# Patient Record
Sex: Female | Born: 1955 | Race: White | Hispanic: No | Marital: Married | Smoking: Former smoker
Health system: Southern US, Community
[De-identification: ages and names within clinical notes are randomized; demographics above are authoritative.]

## PROBLEM LIST (undated history)

## (undated) DIAGNOSIS — B019 Varicella without complication: Secondary | ICD-10-CM

## (undated) DIAGNOSIS — E039 Hypothyroidism, unspecified: Secondary | ICD-10-CM

## (undated) DIAGNOSIS — M8580 Other specified disorders of bone density and structure, unspecified site: Secondary | ICD-10-CM

## (undated) DIAGNOSIS — Z8249 Family history of ischemic heart disease and other diseases of the circulatory system: Secondary | ICD-10-CM

## (undated) DIAGNOSIS — M797 Fibromyalgia: Secondary | ICD-10-CM

## (undated) DIAGNOSIS — E785 Hyperlipidemia, unspecified: Secondary | ICD-10-CM

## (undated) DIAGNOSIS — M810 Age-related osteoporosis without current pathological fracture: Secondary | ICD-10-CM

## (undated) DIAGNOSIS — G8929 Other chronic pain: Secondary | ICD-10-CM

## (undated) DIAGNOSIS — I7300 Raynaud's syndrome without gangrene: Secondary | ICD-10-CM

## (undated) DIAGNOSIS — M542 Cervicalgia: Secondary | ICD-10-CM

## (undated) DIAGNOSIS — K219 Gastro-esophageal reflux disease without esophagitis: Secondary | ICD-10-CM

## (undated) DIAGNOSIS — K589 Irritable bowel syndrome without diarrhea: Secondary | ICD-10-CM

## (undated) DIAGNOSIS — M549 Dorsalgia, unspecified: Secondary | ICD-10-CM

## (undated) DIAGNOSIS — R011 Cardiac murmur, unspecified: Secondary | ICD-10-CM

## (undated) HISTORY — DX: Irritable bowel syndrome, unspecified: K58.9

## (undated) HISTORY — DX: Cervicalgia: M54.2

## (undated) HISTORY — DX: Age-related osteoporosis without current pathological fracture: M81.0

## (undated) HISTORY — DX: Other chronic pain: G89.29

## (undated) HISTORY — DX: Dorsalgia, unspecified: M54.9

## (undated) HISTORY — PX: TONSILLECTOMY: SHX5217

## (undated) HISTORY — DX: Other specified disorders of bone density and structure, unspecified site: M85.80

## (undated) HISTORY — DX: Hyperlipidemia, unspecified: E78.5

## (undated) HISTORY — DX: Raynaud's syndrome without gangrene: I73.00

## (undated) HISTORY — PX: COLONOSCOPY: SHX174

## (undated) HISTORY — DX: Cardiac murmur, unspecified: R01.1

## (undated) HISTORY — DX: Gastro-esophageal reflux disease without esophagitis: K21.9

## (undated) HISTORY — DX: Fibromyalgia: M79.7

## (undated) HISTORY — DX: Hypothyroidism, unspecified: E03.9

## (undated) HISTORY — DX: Family history of ischemic heart disease and other diseases of the circulatory system: Z82.49

## (undated) HISTORY — DX: Varicella without complication: B01.9

---

## 1998-11-15 ENCOUNTER — Other Ambulatory Visit: Admission: RE | Admit: 1998-11-15 | Discharge: 1998-11-15 | Payer: Self-pay | Admitting: *Deleted

## 2000-01-09 ENCOUNTER — Other Ambulatory Visit: Admission: RE | Admit: 2000-01-09 | Discharge: 2000-01-09 | Payer: Self-pay | Admitting: Obstetrics and Gynecology

## 2002-12-25 ENCOUNTER — Encounter (INDEPENDENT_AMBULATORY_CARE_PROVIDER_SITE_OTHER): Payer: Self-pay | Admitting: *Deleted

## 2002-12-25 ENCOUNTER — Ambulatory Visit (HOSPITAL_COMMUNITY): Admission: RE | Admit: 2002-12-25 | Discharge: 2002-12-25 | Payer: Self-pay | Admitting: Internal Medicine

## 2004-08-26 ENCOUNTER — Encounter: Admission: RE | Admit: 2004-08-26 | Discharge: 2004-08-26 | Payer: Self-pay | Admitting: Family Medicine

## 2004-09-02 ENCOUNTER — Encounter (INDEPENDENT_AMBULATORY_CARE_PROVIDER_SITE_OTHER): Payer: Self-pay | Admitting: *Deleted

## 2004-09-02 ENCOUNTER — Ambulatory Visit: Payer: Self-pay | Admitting: Internal Medicine

## 2004-09-02 ENCOUNTER — Ambulatory Visit (HOSPITAL_COMMUNITY): Admission: RE | Admit: 2004-09-02 | Discharge: 2004-09-02 | Payer: Self-pay | Admitting: Internal Medicine

## 2006-04-19 ENCOUNTER — Ambulatory Visit: Payer: Self-pay | Admitting: Internal Medicine

## 2006-04-20 ENCOUNTER — Ambulatory Visit (HOSPITAL_COMMUNITY): Admission: RE | Admit: 2006-04-20 | Discharge: 2006-04-20 | Payer: Self-pay | Admitting: Internal Medicine

## 2006-05-03 ENCOUNTER — Ambulatory Visit: Payer: Self-pay | Admitting: Internal Medicine

## 2006-05-07 ENCOUNTER — Ambulatory Visit (HOSPITAL_COMMUNITY): Admission: RE | Admit: 2006-05-07 | Discharge: 2006-05-07 | Payer: Self-pay | Admitting: Internal Medicine

## 2007-09-26 ENCOUNTER — Emergency Department (HOSPITAL_COMMUNITY): Admission: EM | Admit: 2007-09-26 | Discharge: 2007-09-26 | Payer: Self-pay | Admitting: Emergency Medicine

## 2007-11-14 ENCOUNTER — Ambulatory Visit: Payer: Self-pay | Admitting: Internal Medicine

## 2007-11-21 ENCOUNTER — Ambulatory Visit (HOSPITAL_COMMUNITY): Admission: RE | Admit: 2007-11-21 | Discharge: 2007-11-21 | Payer: Self-pay | Admitting: Internal Medicine

## 2007-11-21 ENCOUNTER — Encounter: Payer: Self-pay | Admitting: Internal Medicine

## 2007-11-25 ENCOUNTER — Ambulatory Visit: Payer: Self-pay | Admitting: Internal Medicine

## 2008-09-22 ENCOUNTER — Encounter: Payer: Self-pay | Admitting: Internal Medicine

## 2011-02-24 ENCOUNTER — Encounter: Payer: Self-pay | Admitting: Family Medicine

## 2011-03-03 NOTE — Letter (Signed)
February 11, 2007    Dr. Frankey Poot  [facility]  [id number]  [place], [place] [address]   RE:  Wanda Robertson, Wanda Robertson  MRN:  [id number]  /  DOB:  1956/03/18   Dear Gala Romney:   Scherrie Bateman has an appointment to see you for evaluation of irritable  bowel syndrome .I would appreciate your assistance for this nice lady  who is 55 years old and has had problems with bloating, dyspepsia and  constipation for at least the last 10 years since I first  saw her in  75.  She has had extensive evaluations endoscopically, as well as  blood tests and has been tried on multiple medications for irritable  bowel syndrome, but remains to be quite symptomatic.  The other  significant medical problems have been fibromyalgia.  She is dependent  on pain medications for that.  She has had  adenomatous polyp of the  rectum which was initially diagnosed 2005 and subsequently ablated with  Erbe .  She will be due for repeat colon exam this year.  She also has  osteoporosis.  Abnormal HIDA scan with only 34.9% ejection fraction,  normal being more than 50.  This was done off her pain medications,  which were discontinued the day before.  She also has a history of iron-  deficiency anemia, which  responded to iron supplements.  Upper  endoscopy in 2003 with a small bowel biopsy was negative for villous  atrophy.  I have tried proton pump inhibitors which she takes on a dally  basis,as well as  antispasmodics.  Zelnorm at some point was also tried  and also pancreatic enzymes as well as probiotic's.  She tried Paxil,  Zoloft, Prozac as well as BuSpar.  She has a positive family history of  colon cancer in her father.   She is interested in being evaluated in your clinic and learning more  about IBS and its relationship to fibromyalgia.  She is quite  knowledgeable about her disease.  She has been reading online and from  articles about IBS.  I will be interested in  your opinion.  She would  like to attend the symposium for the patients that has been scheduled  for August of this year.  I am faxing some of her extensive records to  your office. Please let me know if you need specific information that  you are missing.  I appreciate your assistance with this nice lady.    Sincerely,      Hedwig Morton. Juanda Chance, MD  Electronically Signed    DMB/MedQ  DD: 02/11/2007  DT: 02/12/2007  Job #: [id number]   CC:    Ernestina Penna, M.D.

## 2011-03-03 NOTE — Op Note (Signed)
   NAME:  Wanda Robertson, Wanda Robertson [facility]                     ACCOUNT NO.:  [id number]   MEDICAL RECORD NO.:  [id number]                   PATIENT TYPE:  AMB   LOCATION:  ENDO                                 FACILITY:  [facility]   PHYSICIAN:  Lina Sar, M.D. [facility]               DATE OF BIRTH:  1956/07/12   DATE OF PROCEDURE:  DATE OF DISCHARGE:                                 OPERATIVE REPORT   NAME OF PROCEDURE:  Flexible sigmoidoscopy with Argon laser destruction of a  sigmoid polyp.   INDICATIONS:  This 55 year old white female was found to have a carpeted  polyp in the rectosigmoid on colonoscopy six months ago.  Colonoscopy was  done because of iron-deficiency anemia.  At that time polyp was removed with  the snares.  Because of the carpeted nature of it, we were having a second  look to make sure that there is no polyp left to be destroyed.  The polyp  was adenomatous.   ENDOSCOPE:  Olympus single channel video endoscope and Argon laser  coagulator.   SEDATION:  Versed 7.5 mg IV, Demerol 100 mg IV.   FINDINGS:  Olympus single channel video endoscope passed into ____________  to rectum to sigmoid colon.  The patient was monitored by pulse oximeter.  Oxygen saturations were normal.  Her prep was excellent.  She took a Fleet's  enema prior to the procedure.  Her anal canal and rectal ampullae were  normal.  At the level of 8 cm from the rectum was tiny polypoid lesion  slightly raised with a stellate scar which identified the site of the  previous polyp.  Colonoscope passed up to a level of 30 cm.  Mucosa appeared  normal.  At that point colonoscope was then retracted and biopsies were  taken from the scar as well as from the tiny polypoid lesion.  Next, the  Argon laser coagulator was used to destroy remaining tissue at the polyp  site which measured about 1 cm2.  There was no bleeding from the site.  The  patient tolerated procedure well.   IMPRESSION:  Sigmoid polyp status post  destruction with Argon laser  coagulator.   PLAN:  1. Resume previous diet.  2. Post polypectomy orders.  3. Flexible sigmoidoscopy in one year.                                               Lina Sar, M.D. [facility]    DB/MEDQ  D:  12/25/2002  T:  12/25/2002  Job:  [id number]   cc:   Ernestina Penna, M.D.  [address] [place]  [place] [address]  Fax: [phone]

## 2011-03-03 NOTE — Assessment & Plan Note (Signed)
[facility]                           GASTROENTEROLOGY OFFICE NOTE   Wanda Robertson, Wanda Robertson                          MRN:          [id number]  DATE:05/03/2006                            DOB:          10/31/1955    HISTORY OF PRESENT ILLNESS:  Wanda Robertson is a 55 year old white female who has  irritable bowel syndrome.  She saw Dr. Marina Goodell on April 19, 2006, on emergency  basis while I was out of town, for acute abdominal pain which since then has  continued, and she says it has been only about 50% improved in the last  three weeks.  There was some radiation to the back/rather diffuse abdominal  pain associated with nausea.  There was no vomiting, but there was some  heartburn despite of taking Prevacid 30 mg daily.  She has become somewhat  more constipated, using an enema.  Her liver function tests were mildly  elevated to transaminase about one and a half times normal, but her amylase  and lipase were normal.  Ultrasound of her gallbladder showed normal  gallbladder.   MEDICATIONS:  1.  Amitiza 24 mcg p.r.n.  2.  Avinza 100 mg p.o. b.i.d.  3.  Ambien.  4.  Restoril.  5.  Vivelle patch.  6.  Prometrium.  7.  Prevacid.  8.  Provigil.   PHYSICAL EXAMINATION:  VITAL SIGNS:  Blood pressure 110/60, pulse 94, weight  111 pounds.  She was in no distress.  LUNGS:  Clear to auscultation.  ABDOMEN:  Soft, but mildly distended with decreased bowel sounds and diffuse  nontender mostly in epigastrium and left upper quadrant.  Lower abdomen was  normal.  RECTAL:  Not done.   IMPRESSION:  Rather acute abdominal pain with radiation to the back and  continuous bloating and distention suggestive for pancreatitis, rule out  irritable bowel syndrome, rule out initial biliary colic with passage of  sludge in spite of her having normal ultrasound of the gallbladder.  The  patient has been on narcotics for control of fibromyalgia.  This may also be  affecting her GI  functions.   PLAN:  1.  HIDA scan per [facility].  2.  Repeat the liver function tests, amylase and lipase today.  3.  Continue on Prevacid SoluTabs 30 mg p.o. daily.  4.  Stay on clear liquids and strictly low fat diet.  5.  Continue all other medications.                                   Hedwig Morton. Juanda Chance, MD   DMB/MedQ  DD:  05/03/2006  DT:  05/03/2006  Job #:  [id number]

## 2011-05-01 ENCOUNTER — Telehealth: Payer: Self-pay | Admitting: *Deleted

## 2011-05-01 MED ORDER — OMEPRAZOLE 40 MG PO CPDR: 40.0000 mg | DELAYED_RELEASE_CAPSULE | Freq: Every day | ORAL | Status: DC

## 2011-05-01 NOTE — Telephone Encounter (Signed)
I have spoken to patient to advise her that her insurance has denied coverage on Zegerid Packets. Patient states that she has tried omeprazole and protonix in the past but that it has been so long, she is unsure how well they worked. She thinks they gave her partial relief but the Zegerid worked better for her. She has not tried any PPI within the last 130 days. Patient and I have talked and she has agreed to try omeprazole again with once daily dosing. She will call us back if it does not work well for her and at that time, we will have more leverage with insurance coverage as she will have tried their preferred PPI's within a 130 day period. Patient verbalizes understanding to call us back if the medication does not work well for her.

## 2011-07-24 LAB — COMPREHENSIVE METABOLIC PANEL
ALT: 36 — ABNORMAL HIGH
AST: 37
Albumin: 3.3 — ABNORMAL LOW
Alkaline Phosphatase: 42
BUN: 8
CO2: 25
Calcium: 8.5
Chloride: 107
Creatinine, Ser: 0.6
GFR calc Af Amer: >60
GFR calc non Af Amer: >60
Glucose, Bld: 92
Potassium: 3.5
Sodium: 138
Total Bilirubin: 0.5
Total Protein: 6

## 2011-07-24 LAB — DIFFERENTIAL
Basophils Absolute: 0
Basophils Relative: 0
Eosinophils Absolute: 0 — ABNORMAL LOW
Eosinophils Relative: 0
Lymphocytes Relative: 8 — ABNORMAL LOW
Lymphs Abs: 0.8
Monocytes Absolute: 1
Monocytes Relative: 9
Neutro Abs: 8.4 — ABNORMAL HIGH
Neutrophils Relative %: 82 — ABNORMAL HIGH

## 2011-07-24 LAB — URINALYSIS, ROUTINE W REFLEX MICROSCOPIC
Bilirubin Urine: NEGATIVE
Glucose, UA: NEGATIVE
Hgb urine dipstick: NEGATIVE
Nitrite: NEGATIVE
Protein, ur: NEGATIVE
Specific Gravity, Urine: 1.008
Urobilinogen, UA: 0.2
pH: 7.5

## 2011-07-24 LAB — CBC
HCT: 33.3 — ABNORMAL LOW
Hemoglobin: 11.4 — ABNORMAL LOW
MCHC: 34.4
MCV: 91.3
Platelets: 211
RBC: 3.65 — ABNORMAL LOW
RDW: 13
WBC: 10.2

## 2011-07-24 LAB — PREGNANCY, URINE: Preg Test, Ur: NEGATIVE

## 2011-07-24 LAB — LIPASE, BLOOD: Lipase: 26

## 2011-08-23 DIAGNOSIS — N951 Menopausal and female climacteric states: Secondary | ICD-10-CM | POA: Insufficient documentation

## 2011-08-23 DIAGNOSIS — Z79890 Hormone replacement therapy (postmenopausal): Secondary | ICD-10-CM | POA: Insufficient documentation

## 2011-11-21 ENCOUNTER — Telehealth: Payer: Self-pay | Admitting: *Deleted

## 2011-11-21 NOTE — Telephone Encounter (Signed)
We received a note from [facility] requesting prior authorization for Zegerid capsules (1 capsule twice daily). I have contacted patient's insurance company (phone [phone]) and they have denied request for zegerid approval. I have advised insurance company that patient has tried omeprazole and protonix without relief. They have asked if she has GERD evaluated by endoscopy. I have explained that to my knowledge, no recent endoscopy has been performed. They would like patient to try a "tier 2" medication, Lansoprazole before they will approve Zegerid. They will go ahead and put in authorization of Lansoprazole for now. I have left a message for patient to call back.

## 2011-11-22 MED ORDER — LANSOPRAZOLE 30 MG PO CPDR: 30.0000 mg | DELAYED_RELEASE_CAPSULE | Freq: Two times a day (BID) | ORAL | Status: DC

## 2011-11-22 NOTE — Telephone Encounter (Signed)
I have spoken to patient and have explained that insurance will cover lansoprazole but still will not cover Zegerid yet. She will take lansoprazole bid x 1 month and will let me know if it does not work so I can then let her insurance know she has tried omeprazole, pantoprazole and lansoprazole without relief. Rx for lansoprazole sent.

## 2012-05-07 ENCOUNTER — Telehealth: Payer: Self-pay | Admitting: *Deleted

## 2012-05-07 MED ORDER — NYSTATIN 100000 UNIT/GM EX OINT: TOPICAL_OINTMENT | Freq: Two times a day (BID) | CUTANEOUS | Status: AC

## 2012-05-07 MED ORDER — PAREGORIC 2 MG/5ML PO TINC: ORAL | Status: DC

## 2012-05-07 NOTE — Telephone Encounter (Signed)
Message copied by Richardson Chiquito on Tue May 07, 2012  8:31 AM ------      Message from: Hart Carwin      Created: Mon May 06, 2012  7:42 PM      Regarding: refills       Hello Wanda Robertson, Wanda Robertson asked me to refill her Paregoric ( last filled in 2009), 240cc, 1 tsp po q 8-12 hrs prn severe diarrhea. And Nystatin 100 000 u ointment, 30 gm, apply bid to affected area, 2 refills, she uses [facility] as her Pharmacy. Also, she will come in am to get Align samples, # 3 boxes, in a bag out at the front desk on the 3rd floor. Thank you very much!! DB

## 2012-05-07 NOTE — Telephone Encounter (Signed)
Align has been placed at the front desk. Paregoric and Nystatin have been sent to [facility].

## 2012-09-06 ENCOUNTER — Other Ambulatory Visit: Payer: Self-pay | Admitting: Internal Medicine

## 2012-09-06 MED ORDER — ZOSTER VACCINE LIVE 19400 UNT/0.65ML ~~LOC~~ SOLR: 0.6500 mL | Freq: Once | SUBCUTANEOUS | Status: DC

## 2012-09-06 NOTE — Progress Notes (Signed)
Per Dr Juanda Chance, please send patient shingles vaccine to [facility] pharmacy. Rx sent.

## 2012-11-18 ENCOUNTER — Telehealth: Payer: Self-pay | Admitting: Internal Medicine

## 2012-11-18 NOTE — Telephone Encounter (Signed)
Forward 4 pages from [facility] to Dr. Lina Sar for review on 11-18-12  ym

## 2013-02-03 ENCOUNTER — Other Ambulatory Visit: Payer: Self-pay | Admitting: *Deleted

## 2013-02-03 MED ORDER — ISOMETHEPTENE-APAP-DICHLORAL 65-325-100 MG PO CAPS: ORAL_CAPSULE | ORAL | Status: DC

## 2013-02-03 NOTE — Telephone Encounter (Signed)
LAST SEEN 11/14/12. LAST REFILL 11/29/12. PLEASE ROUTE TO NURSE TO CALL IN.

## 2013-02-03 NOTE — Telephone Encounter (Signed)
CALL IN TO [facility]

## 2013-02-05 NOTE — Telephone Encounter (Signed)
PLEASE CALL IN. NOT CALLED IN YET. THANKS

## 2013-02-05 NOTE — Telephone Encounter (Signed)
Called to [facility] and message left on their voicemail.

## 2013-05-04 ENCOUNTER — Other Ambulatory Visit: Payer: Self-pay | Admitting: Family Medicine

## 2013-05-08 ENCOUNTER — Telehealth: Payer: Self-pay | Admitting: Pharmacist

## 2013-05-08 NOTE — Telephone Encounter (Signed)
Discussed with patient that all pain medication should be written by same physician.  Patient to discuss with Dr Vear Clock at next visit about starting to write Rx's for her tramadol in future

## 2013-05-08 NOTE — Telephone Encounter (Signed)
Received corrrespondance from [facility] regarding medications.  Left Message for patient to call our office to discuss

## 2013-05-15 ENCOUNTER — Ambulatory Visit (INDEPENDENT_AMBULATORY_CARE_PROVIDER_SITE_OTHER): Payer: BC Managed Care – PPO | Admitting: Family Medicine

## 2013-05-15 ENCOUNTER — Encounter: Payer: Self-pay | Admitting: Family Medicine

## 2013-05-15 VITALS — BP 127/86 | HR 88 | Temp 97.4°F | Ht 64.25 in | Wt 118.2 lb

## 2013-05-15 DIAGNOSIS — E785 Hyperlipidemia, unspecified: Secondary | ICD-10-CM | POA: Insufficient documentation

## 2013-05-15 DIAGNOSIS — K219 Gastro-esophageal reflux disease without esophagitis: Secondary | ICD-10-CM

## 2013-05-15 DIAGNOSIS — IMO0001 Reserved for inherently not codable concepts without codable children: Secondary | ICD-10-CM

## 2013-05-15 DIAGNOSIS — E559 Vitamin D deficiency, unspecified: Secondary | ICD-10-CM

## 2013-05-15 DIAGNOSIS — M810 Age-related osteoporosis without current pathological fracture: Secondary | ICD-10-CM

## 2013-05-15 DIAGNOSIS — E039 Hypothyroidism, unspecified: Secondary | ICD-10-CM

## 2013-05-15 DIAGNOSIS — R5383 Other fatigue: Secondary | ICD-10-CM

## 2013-05-15 DIAGNOSIS — M797 Fibromyalgia: Secondary | ICD-10-CM | POA: Insufficient documentation

## 2013-05-15 DIAGNOSIS — G8929 Other chronic pain: Secondary | ICD-10-CM

## 2013-05-15 DIAGNOSIS — M542 Cervicalgia: Secondary | ICD-10-CM | POA: Insufficient documentation

## 2013-05-15 DIAGNOSIS — Z8249 Family history of ischemic heart disease and other diseases of the circulatory system: Secondary | ICD-10-CM

## 2013-05-15 HISTORY — DX: Age-related osteoporosis without current pathological fracture: M81.0

## 2013-05-15 HISTORY — DX: Hypothyroidism, unspecified: E03.9

## 2013-05-15 HISTORY — DX: Gastro-esophageal reflux disease without esophagitis: K21.9

## 2013-05-15 HISTORY — DX: Hyperlipidemia, unspecified: E78.5

## 2013-05-15 HISTORY — DX: Fibromyalgia: M79.7

## 2013-05-15 LAB — POCT CBC
Granulocyte percent: 68.4 %G (ref 37–80)
Hemoglobin: 13.3 g/dL (ref 12.2–16.2)
MCH, POC: 30.4 pg (ref 27–31.2)
MCV: 93.1 fL (ref 80–97)
MPV: 8 fL (ref 0–99.8)
Platelet Count, POC: 236 10*3/uL (ref 142–424)
RBC: 4.4 M/uL (ref 4.04–5.48)

## 2013-05-15 MED ORDER — ISOMETHEPTENE-APAP-DICHLORAL 65-325-100 MG PO CAPS: ORAL_CAPSULE | ORAL | Status: DC

## 2013-05-15 MED ORDER — CARISOPRODOL 350 MG PO TABS: 350.0000 mg | ORAL_TABLET | Freq: Two times a day (BID) | ORAL | Status: DC | PRN

## 2013-05-15 MED ORDER — SYNTHROID 50 MCG PO TABS: ORAL_TABLET | ORAL | Status: DC

## 2013-05-15 MED ORDER — ZOLPIDEM TARTRATE ER 12.5 MG PO TBCR: 12.5000 mg | EXTENDED_RELEASE_TABLET | Freq: Every evening | ORAL | Status: DC | PRN

## 2013-05-15 NOTE — Addendum Note (Signed)
Addended by: Tommas Olp on: 05/15/2013 01:15 PM   Modules accepted: Orders

## 2013-05-15 NOTE — Progress Notes (Signed)
Subjective:    Patient ID: Wanda Robertson, female    DOB: Apr 20, 1956, 57 y.o.   MRN: [id number]  HPI Patient returns to clinic for followup and management of chronic medical problems. She is followed by the pain clinic and Dr. Thyra Breed.   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 She appears to be up-to-date on all of her health maintenance parameters other than her upcoming flu shot and FOBT which will be given to her today. As of note there is no particular reason for not taking the flu shot other than she just prefers not to take. She has an order in for lab work to be done. Her last labs were in January of 2014. At that time all of her lab work was good including her vitamin D and her LDL C. which was 95. She also had a chest x-ray at that time and this was normal. The family history is positive for heart disease. Her last DEXA scan was January 2013   Review of Systems  Constitutional: Positive for fatigue (chronic).  HENT: Negative for ear pain, congestion, sore throat and sneezing.   Eyes: Negative for pain, discharge, redness, itching and visual disturbance.  Respiratory: Negative for cough, choking, chest tightness, shortness of breath and wheezing.   Cardiovascular: Negative.  Negative for chest pain, palpitations and leg swelling.  Gastrointestinal: Positive for constipation (intermitent).  Genitourinary: Positive for vaginal discharge (seeing GYN). Negative for dysuria and frequency.  Musculoskeletal: Positive for myalgias (all over), back pain (cervical) and arthralgias (bilateral shoulders,wrists).  Skin: Negative.  Negative for color change, pallor, rash and wound.  Allergic/Immunologic: Negative.  Negative for environmental allergies, food allergies and immunocompromised state.  Neurological: Positive for dizziness and headaches (occasional). Negative for tremors, weakness, light-headedness and numbness.  Psychiatric/Behavioral: Positive for sleep disturbance (nightly).  Negative for confusion and agitation. The patient is not nervous/anxious.        Objective:   Physical Exam BP 127/86  Pulse 88  Temp(Src) 97.4 F (36.3 C) (Oral)  Ht 5' 4.25" (1.632 m)  Wt 118 lb 3.2 oz (53.615 kg)  BMI 20.13 kg/m2  The patient appeared well nourished and normally developed, alert and oriented to time and place. Speech, behavior and judgement appear normal. Vital signs as documented.  Head exam is unremarkable. No scleral icterus or pallor noted.  Neck is without jugular venous distension, thyromegally, or carotid bruits. Carotid upstrokes are brisk bilaterally. No cervical adenopathy. Lungs are clear anteriorly and posteriorly to auscultation. Normal respiratory effort. Cardiac exam reveals regular rate and rhythm at 72 per minute. First and second heart sounds normal.  No murmurs, rubs or gallops.  Abdominal exam reveals normal bowl sounds, no masses, no organomegaly and no aortic enlargement. No inguinal adenopathy. Extremities are nonedematous and both femoral and pedal pulses are normal. Skin without pallor or jaundice.  Warm and dry, without rash. Neurologic exam reveals normal deep tendon reflexes and normal sensation.           Assessment & Plan:  1. Hyperlipemia - Hepatic function panel; Standing - NMR, lipoprofile; Standing  2. GERD (gastroesophageal reflux disease)  3. Fibromyalgia  4. Chronic neck pain with neuropathy  5. Fatigue- POCT CBC; Standing - BMP8+EGFR; Standing  6. Vitamin D deficiency - Vitamin D 25 hydroxy; Standing  7. Hypothyroidism   Patient Instructions  Don't put yourself at risk of falling, avoid precarious situations. Do not climb. Continue medications as directed Continue aggressive therapeutic lifestyle changes which include diet and exercise  Follow with pain clinic per their directions    We will arrange a visit for a stress test at some point in the future with Dr. Shirlee Latch was [facility] .  Nyra Capes MD

## 2013-05-15 NOTE — Patient Instructions (Addendum)
Don't put yourself at risk of falling, avoid precarious situations. Do not climb. Continue medications as directed Continue aggressive therapeutic lifestyle changes which include diet and exercise Follow with pain clinic per their directions

## 2013-05-15 NOTE — Addendum Note (Signed)
Addended by: Bearl Mulberry on: 05/15/2013 01:15 PM   Modules accepted: Orders, Medications

## 2013-05-17 LAB — BMP8+EGFR
BUN/Creatinine Ratio: 22 (ref 9–23)
BUN: 17 mg/dL (ref 6–24)
CO2: 27 mmol/L (ref 18–29)
Calcium: 9.6 mg/dL (ref 8.7–10.2)
Creatinine, Ser: 0.78 mg/dL (ref 0.57–1.00)
GFR calc non Af Amer: 85 mL/min/{1.73_m2} (ref >59)
Sodium: 141 mmol/L (ref 134–144)

## 2013-05-17 LAB — HEPATIC FUNCTION PANEL
AST: 18 IU/L (ref 0–40)
Albumin: 4.3 g/dL (ref 3.5–5.5)
Total Bilirubin: 0.2 mg/dL (ref 0.0–1.2)
Total Protein: 6.9 g/dL (ref 6.0–8.5)

## 2013-05-17 LAB — NMR, LIPOPROFILE
Cholesterol: 145 mg/dL (ref <200)
HDL Cholesterol by NMR: 43 mg/dL (ref >=40)
HDL Particle Number: 24.2 umol/L — ABNORMAL LOW (ref >=30.5)
LDLC SERPL CALC-MCNC: 88 mg/dL (ref <100)
Small LDL Particle Number: 524 nmol/L (ref <=527)

## 2013-07-17 ENCOUNTER — Institutional Professional Consult (permissible substitution): Payer: Self-pay | Admitting: Cardiology

## 2013-08-04 DIAGNOSIS — B351 Tinea unguium: Secondary | ICD-10-CM

## 2013-08-15 ENCOUNTER — Ambulatory Visit: Payer: BC Managed Care – PPO

## 2013-08-15 ENCOUNTER — Ambulatory Visit (INDEPENDENT_AMBULATORY_CARE_PROVIDER_SITE_OTHER): Payer: BC Managed Care – PPO | Admitting: Emergency Medicine

## 2013-08-15 VITALS — BP 144/90 | HR 92 | Temp 97.9°F | Resp 16 | Ht 65.0 in | Wt 118.0 lb

## 2013-08-15 DIAGNOSIS — R079 Chest pain, unspecified: Secondary | ICD-10-CM

## 2013-08-15 DIAGNOSIS — S20219A Contusion of unspecified front wall of thorax, initial encounter: Secondary | ICD-10-CM

## 2013-08-15 DIAGNOSIS — S20212A Contusion of left front wall of thorax, initial encounter: Secondary | ICD-10-CM

## 2013-08-15 NOTE — Patient Instructions (Signed)
Rib Fracture  Your caregiver has diagnosed you as having a rib fracture (a break). This can occur by a blow to the chest, by a fall against a hard object, or by violent coughing or sneezing. There may be one or many breaks. Rib fractures may heal on their own within 3 to 8 weeks. The longer healing period is usually associated with a continued cough or other aggravating activities.  HOME CARE INSTRUCTIONS    Avoid strenuous activity. Be careful during activities and avoid bumping the injured rib. Activities that cause pain pull on the fracture site(s) and are best avoided if possible.   Eat a normal, well-balanced diet. Drink plenty of fluids to avoid constipation.   Take deep breaths several times a day to keep lungs free of infection. Try to cough several times a day, splinting the injured area with a pillow. This will help prevent pneumonia.   Do not wear a rib belt or binder. These restrict breathing which can lead to pneumonia.   Only take over-the-counter or prescription medicines for pain, discomfort, or fever as directed by your caregiver.  SEEK MEDICAL CARE IF:   You develop a continual cough, associated with thick or bloody sputum.  SEEK IMMEDIATE MEDICAL CARE IF:    You have a fever.   You have difficulty breathing.   You have nausea (feeling sick to your stomach), vomiting, or abdominal (belly) pain.   You have worsening pain, not controlled with medications.  Document Released: 10/02/2005 Document Revised: 12/25/2011 Document Reviewed: 03/06/2007  Exit[employer] Patient Information 2014 [employer].

## 2013-08-15 NOTE — Progress Notes (Signed)
[facility] [address], [place] [address] [phone]  Date:  08/15/2013   Name:  Wanda Robertson   DOB:  July 17, 1956   MRN:  [id number]  PCP:  Rudi Heap, MD    Chief Complaint: Fall   History of Present Illness:  Wanda Robertson is a 57 y.o. very pleasant female patient who presents with the following:  Tripped over a ball last night and fell against a granite top corner.  Injured her left back.  No shortness of breath, nausea or vomiting, hemoptysis, abdominal pain or hematuria.  No improvement with over the counter medications or other home remedies. Denies other complaint or health concern today.   Patient Active Problem List   Diagnosis Date Noted  . Hyperlipemia 05/15/2013  . GERD (gastroesophageal reflux disease) 05/15/2013  . Fibromyalgia 05/15/2013  . Chronic neck pain, with peripheral neuropathy 05/15/2013  . Osteoporosis 05/15/2013  . Hypothyroidism 05/15/2013    Past Medical History  Diagnosis Date  . Hyperlipidemia   . Chronic back pain   . Chronic neck pain   . Osteopenia   . IBS (irritable bowel syndrome)     No past surgical history on file.  History  Substance Use Topics  . Smoking status: Never Smoker   . Smokeless tobacco: Not on file  . Alcohol Use: No    Family History  Problem Relation Age of Onset  . Heart disease      family history   . Colon cancer      family history     Allergies  Allergen Reactions  . Compazine [Prochlorperazine Edisylate] Other (See Comments)    Severe muscle requiring ER visit  . Penicillins Hives  . Sulfa Antibiotics Rash    Medication list has been reviewed and updated.  Current Outpatient Prescriptions on File Prior to Visit  Medication Sig Dispense Refill  . ALPRAZolam (XANAX) 0.5 MG tablet Take 0.5 mg by mouth at bedtime as needed.        . Calcium Carbonate-Vitamin D (CALCIUM PLUS VITAMIN D) 300-100 MG-UNIT CAPS Take 4 tablets by mouth daily.      . carisoprodol  (SOMA) 350 MG tablet Take 1 tablet (350 mg total) by mouth 2 (two) times daily as needed.  180 tablet  1  . Clidinium-Chlordiazepoxide (LIBRAX PO) Take by mouth as needed.        . fish oil-omega-3 fatty acids 1000 MG capsule Take 2 g by mouth daily.      Marland Kitchen isometheptene-acetaminophen-dichloralphenazone (MIDRIN) 65-325-100 MG capsule Take 1 to 2 capsules once a day as needed for tension headaches  90 capsule  1  . morphine (KADIAN) 100 MG 24 hr capsule Take 100 mg by mouth 2 (two) times daily.       . Multiple Vitamins-Minerals (CENTRUM SILVER PO) Take 1 tablet by mouth daily.      . paregoric 2 MG/5ML solution Take 1 teaspoon by mouth every 8-12 hours as needed for severe diarrhea  240 mL  1  . SYNTHROID 50 MCG tablet TAKE 1 TABLET EVERY DAY  90 tablet  1  . zolpidem (AMBIEN CR) 12.5 MG CR tablet Take 1 tablet (12.5 mg total) by mouth at bedtime as needed.  90 tablet  1  . lansoprazole (PREVACID) 30 MG capsule Take 30 mg by mouth 2 (two) times daily. As needed       No current facility-administered medications on file prior to visit.    Review of Systems:  As per HPI, otherwise negative.    Physical Examination: Filed Vitals:   08/15/13 1713  BP: 144/90  Pulse: 92  Temp: 97.9 F (36.6 C)  Resp: 16   Filed Vitals:   08/15/13 1713  Height: 5\' 5"  (1.651 m)  Weight: 118 lb (53.524 kg)   Body mass index is 19.64 kg/(m^2). Ideal Body Weight: Weight in (lb) to have BMI = 25: 149.9  GEN: WDWN, NAD, Non-toxic, A & O x 3 HEENT: Atraumatic, Normocephalic. Neck supple. No masses, No LAD. Ears and Nose: No external deformity. CV: RRR, No M/G/R. No JVD. No thrill. No extra heart sounds. PULM: CTA B, no wheezes, crackles, rhonchi. No retractions. No resp. distress. No accessory muscle use. Chest:  No ecchymosis, crepitus, or flail. Has pain in posterolateral chest wall inferiorly ABD: S, NT, ND, +BS. No rebound. No HSM. EXTR: No c/c/e NEURO Normal gait.  PSYCH: Normally interactive.  Conversant. Not depressed or anxious appearing.  Calm demeanor.    Assessment and Plan: Chest wall contusion vicodin   Signed,  Phillips Odor, MD   [facility] reading (PRIMARY) by  Dr. Dareen Piano.  Negative chest.

## 2013-08-21 ENCOUNTER — Other Ambulatory Visit: Payer: Self-pay

## 2013-10-28 ENCOUNTER — Other Ambulatory Visit: Payer: Self-pay | Admitting: Family Medicine

## 2013-10-30 NOTE — Telephone Encounter (Signed)
This is okay to refill but the patient does need to get thyroid tests done in the office at her convenience.

## 2013-10-30 NOTE — Telephone Encounter (Signed)
No thyroid labs since 09/13

## 2014-02-04 ENCOUNTER — Ambulatory Visit: Payer: BC Managed Care – PPO | Admitting: Family Medicine

## 2014-02-25 ENCOUNTER — Encounter: Payer: Self-pay | Admitting: Family Medicine

## 2014-02-25 ENCOUNTER — Ambulatory Visit (INDEPENDENT_AMBULATORY_CARE_PROVIDER_SITE_OTHER): Payer: BC Managed Care – PPO | Admitting: Family Medicine

## 2014-02-25 VITALS — BP 115/77 | HR 89 | Temp 97.0°F | Ht 65.0 in | Wt 119.0 lb

## 2014-02-25 DIAGNOSIS — E559 Vitamin D deficiency, unspecified: Secondary | ICD-10-CM

## 2014-02-25 DIAGNOSIS — M797 Fibromyalgia: Secondary | ICD-10-CM

## 2014-02-25 DIAGNOSIS — Z8249 Family history of ischemic heart disease and other diseases of the circulatory system: Secondary | ICD-10-CM

## 2014-02-25 DIAGNOSIS — I7300 Raynaud's syndrome without gangrene: Secondary | ICD-10-CM

## 2014-02-25 DIAGNOSIS — K219 Gastro-esophageal reflux disease without esophagitis: Secondary | ICD-10-CM

## 2014-02-25 DIAGNOSIS — E785 Hyperlipidemia, unspecified: Secondary | ICD-10-CM

## 2014-02-25 DIAGNOSIS — E039 Hypothyroidism, unspecified: Secondary | ICD-10-CM

## 2014-02-25 DIAGNOSIS — IMO0001 Reserved for inherently not codable concepts without codable children: Secondary | ICD-10-CM

## 2014-02-25 DIAGNOSIS — M810 Age-related osteoporosis without current pathological fracture: Secondary | ICD-10-CM

## 2014-02-25 HISTORY — DX: Family history of ischemic heart disease and other diseases of the circulatory system: Z82.49

## 2014-02-25 HISTORY — DX: Raynaud's syndrome without gangrene: I73.00

## 2014-02-25 MED ORDER — CARISOPRODOL 350 MG PO TABS: 350.0000 mg | ORAL_TABLET | Freq: Two times a day (BID) | ORAL | Status: DC | PRN

## 2014-02-25 MED ORDER — ISOMETHEPTENE-APAP-DICHLORAL 65-325-100 MG PO CAPS: ORAL_CAPSULE | ORAL | Status: DC

## 2014-02-25 MED ORDER — SUVOREXANT 10 MG PO TABS: 1.0000 | ORAL_TABLET | Freq: Every evening | ORAL | Status: DC | PRN

## 2014-02-25 NOTE — Progress Notes (Signed)
Subjective:    Patient ID: Wanda Robertson, female    DOB: 1956/05/05, 58 y.o.   MRN: [id number]  HPI Pt here for follow up and management of chronic medical problems. The patient is followed by Dr. Nicholaus Bloom for pain management. She has a strong family history of heart disease and she herself has hyperlipidemia. Her parents have both passed away. Her brother has heart disease and is still living. She will return to the office for lab work. She has not had a stress test in 5 years and we will schedule another stress test for her in the near future. 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 It even bothers her during the warmer months when she goes into buildings that are very cool and air-conditioned. She denies chest pain or shortness of breath. She still has her neck pain which radiates to both arms on occasion.          Patient Active Problem List   Diagnosis Date Noted  . Hyperlipemia 05/15/2013  . GERD (gastroesophageal reflux disease) 05/15/2013  . Fibromyalgia 05/15/2013  . Chronic neck pain, with peripheral neuropathy 05/15/2013  . Osteoporosis 05/15/2013  . Hypothyroidism 05/15/2013   Outpatient Encounter Prescriptions as of 02/25/2014  Medication Sig  . ALPRAZolam (XANAX) 0.5 MG tablet Take 0.5 mg by mouth at bedtime as needed.    . Calcium Carbonate-Vitamin D (CALCIUM PLUS VITAMIN D) 300-100 MG-UNIT CAPS Take 4 tablets by mouth daily.  . carisoprodol (SOMA) 350 MG tablet Take 1 tablet (350 mg total) by mouth 2 (two) times daily as needed.  . Clidinium-Chlordiazepoxide (LIBRAX PO) Take by mouth as needed.    . fish oil-omega-3 fatty acids 1000 MG capsule Take 2 g by mouth daily.  Marland Kitchen isometheptene-acetaminophen-dichloralphenazone (MIDRIN) 65-325-100 MG capsule Take 1 to 2 capsules once a day as needed for tension headaches  . levothyroxine (SYNTHROID) 50 MCG tablet TAKE 1 TABLET DAILY  . morphine  (KADIAN) 100 MG 24 hr capsule Take 100 mg by mouth 2 (two) times daily.   . Multiple Vitamins-Minerals (CENTRUM SILVER PO) Take 1 tablet by mouth daily.  Marland Kitchen zolpidem (AMBIEN CR) 12.5 MG CR tablet Take 1 tablet (12.5 mg total) by mouth at bedtime as needed.  . [DISCONTINUED] SYNTHROID 50 MCG tablet TAKE 1 TABLET DAILY (PATIENT NEEDS TO BE SEEN BEFORE NEXT REFILL)  . lansoprazole (PREVACID) 30 MG capsule Take 30 mg by mouth 2 (two) times daily. As needed  . paregoric 2 MG/5ML solution Take 1 teaspoon by mouth every 8-12 hours as needed for severe diarrhea    Review of Systems  Constitutional: Negative.   HENT: Negative.   Eyes: Negative.   Respiratory: Negative.   Cardiovascular: Negative.   Gastrointestinal: Negative.   Endocrine: Negative.   Genitourinary: Negative.   Musculoskeletal: Negative.   Skin: Negative.   Allergic/Immunologic: Negative.   Neurological: Negative.   Hematological: Negative.   Psychiatric/Behavioral: Negative.        Objective:   Physical Exam  Nursing note and vitals reviewed. Constitutional: She is oriented to person, place, and time. No distress.  Present cooperative small framed patient  HENT:  Head: Normocephalic.  Right Ear: External ear normal.  Left Ear: External ear normal.  Nose: Nose normal.  Mouth/Throat: Oropharynx is clear and moist.  Eyes: Conjunctivae and EOM are normal. Pupils are equal, round, and reactive to light. Right eye exhibits no discharge. Left eye exhibits  no discharge. No scleral icterus.  Neck: Normal range of motion. Neck supple. No thyromegaly present.  Cardiovascular: Normal rate, regular rhythm, normal heart sounds and intact distal pulses.  Exam reveals no gallop and no friction rub.   No murmur heard. At 72 per minute  Pulmonary/Chest: Effort normal and breath sounds normal. No respiratory distress. She has no wheezes. She has no rales. She exhibits no tenderness.  Abdominal: Soft. Bowel sounds are normal. She  exhibits no mass. There is no tenderness. There is no rebound and no guarding.  Musculoskeletal: Normal range of motion. She exhibits no edema and no tenderness.  Lymphadenopathy:    She has no cervical adenopathy.  Neurological: She is alert and oriented to person, place, and time. She has normal reflexes. No cranial nerve deficit.  Skin: Skin is warm and dry.  No pallor and no sign of raynauds on examination of fingers  Psychiatric: She has a normal mood and affect. Her behavior is normal. Judgment and thought content normal.   BP 115/77  Pulse 89  Temp(Src) 97 F (36.1 C) (Oral)  Ht _0  (1.651 m)  Wt 119 lb (53.978 kg)  BMI 19.80 kg/m2        Assessment & Plan:  1. GERD (gastroesophageal reflux disease) - POCT CBC; Future  2. Hyperlipemia - POCT CBC; Future - BMP8+EGFR; Future - Hepatic function panel; Future - NMR, lipoprofile; Future  3. Hypothyroidism - POCT CBC; Future - Thyroid Panel With TSH; Future  4. Osteoporosis - POCT CBC; Future - Vit D  25 hydroxy (rtn osteoporosis monitoring); Future -Patient gets her DEXA scans at an outside facility  5. Vitamin D deficiency - POCT CBC; Future - Vit D  25 hydroxy (rtn osteoporosis monitoring); Future  6. Family history of heart disease -We will plan for a stress test for the patient - Ambulatory referral to Cardiology  7. Raynaud's disease - Ambulatory referral to Rheumatology  8. Fibromyalgia - Ambulatory referral to Rheumatology  Patient Instructions  Continue current medications. Continue good therapeutic lifestyle changes which include good diet and exercise. Fall precautions discussed with patient. If an FOBT was given today- please return it to our front desk. If you are over 41 years old - you may need Prevnar 33 or the adult Pneumonia vaccine.  Consider having a carpenter build a platform to stand on at work. We will arrange for you to have an appointment with the cardiologist for an ETT, Dr.  Loralie Champagne We will also schedule a visit with the rheumatologist Dr. Yancey Flemings for an evaluation for Raynauds disease Please continue to follow your diet closely Return to clinic for lab work Return the FOBT   Arrie Senate MD

## 2014-02-25 NOTE — Patient Instructions (Addendum)
Continue current medications. Continue good therapeutic lifestyle changes which include good diet and exercise. Fall precautions discussed with patient. If an FOBT was given today- please return it to our front desk. If you are over 58 years old - you may need Prevnar 6 or the adult Pneumonia vaccine.  Consider having a carpenter build a platform to stand on at work. We will arrange for you to have an appointment with the cardiologist for an ETT, Dr. Loralie Champagne We will also schedule a visit with the rheumatologist Dr. Yancey Flemings for an evaluation for Raynauds disease Please continue to follow your diet closely Return to clinic for lab work Return the FOBT

## 2014-04-15 LAB — HM MAMMOGRAPHY

## 2014-05-22 ENCOUNTER — Encounter: Payer: Self-pay | Admitting: *Deleted

## 2014-06-05 ENCOUNTER — Ambulatory Visit: Payer: Self-pay | Admitting: Cardiology

## 2014-06-18 ENCOUNTER — Other Ambulatory Visit: Payer: BC Managed Care – PPO

## 2014-06-18 DIAGNOSIS — Z1212 Encounter for screening for malignant neoplasm of rectum: Secondary | ICD-10-CM

## 2014-06-20 LAB — FECAL OCCULT BLOOD, IMMUNOCHEMICAL: Fecal Occult Bld: NEGATIVE

## 2014-07-02 ENCOUNTER — Encounter: Payer: Self-pay | Admitting: *Deleted

## 2014-08-21 ENCOUNTER — Ambulatory Visit (INDEPENDENT_AMBULATORY_CARE_PROVIDER_SITE_OTHER): Payer: BC Managed Care – PPO | Admitting: Cardiology

## 2014-08-21 ENCOUNTER — Encounter: Payer: Self-pay | Admitting: Cardiology

## 2014-08-21 ENCOUNTER — Ambulatory Visit (INDEPENDENT_AMBULATORY_CARE_PROVIDER_SITE_OTHER)
Admission: RE | Admit: 2014-08-21 | Discharge: 2014-08-21 | Disposition: A | Discharge status: Home / Self Care | Payer: Self-pay | Source: Ambulatory Visit | Attending: Cardiology | Admitting: Cardiology

## 2014-08-21 VITALS — BP 136/92 | HR 96 | Ht 65.0 in | Wt 118.0 lb

## 2014-08-21 DIAGNOSIS — E785 Hyperlipidemia, unspecified: Secondary | ICD-10-CM

## 2014-08-21 DIAGNOSIS — Z9189 Other specified personal risk factors, not elsewhere classified: Secondary | ICD-10-CM

## 2014-08-21 DIAGNOSIS — Z8249 Family history of ischemic heart disease and other diseases of the circulatory system: Secondary | ICD-10-CM

## 2014-08-21 DIAGNOSIS — I491 Atrial premature depolarization: Secondary | ICD-10-CM

## 2014-08-21 DIAGNOSIS — I498 Other specified cardiac arrhythmias: Secondary | ICD-10-CM

## 2014-08-21 MED ORDER — ASPIRIN EC 81 MG PO TBEC: 81.0000 mg | DELAYED_RELEASE_TABLET | Freq: Every day | ORAL | Status: DC

## 2014-08-21 NOTE — Patient Instructions (Addendum)
Take aspirin 81mg  daily.  You will have a cardiac CT for calcium scoring today.   Your physician wants you to follow-up in: 1 year with Dr Aundra Dubin. (November 2016).  You will receive a reminder letter in the mail two months in advance. If you don't receive a letter, please call our office to schedule the follow-up appointment.

## 2014-08-23 DIAGNOSIS — I491 Atrial premature depolarization: Secondary | ICD-10-CM | POA: Insufficient documentation

## 2014-08-23 NOTE — Progress Notes (Signed)
Patient ID: Wanda Robertson, female   DOB: 25-Feb-1956, 58 y.o.   MRN: [id number] PCP: Dr Laurance Flatten  58 yo with strong family history of premature coronary disease present for cardiology evaluation.  She has not had cardiac problems in the past.  She generally does well.  No exertional dyspnea or chest pain.  She has been a vegetarian for 25 years.  She has never smoked.  She is quite active, walks her dog briskly daily and does yoga.  No lightheadedness, no palpitations.  BP is mildly elevated today but she says that she is nervous and BP is usually on the lower side.  She is supposed to be getting NMR lipid profile soon at Dr [facility] office.   ECG: Unusual P wave axis, possible ectopic atrial rhythm, prolonged QTc.   Labs (7/14): LDL 88, HDL 43, LDL particle number 1357, creatinine 0.78  PMH: 1. Hyperlipidemia 2. Raynauds syndrome 3. GERD 4. Fibromyalgia 5. Chronic neck pain with neuropathy 6. Hypothyroidism  FH: Mother with MI at 77, brother with PCI at 41 and later had CABG, father with CHF.  SH: Nonsmoker, lives in [place], works as Engineering geologist.   ROS: All systems reviewed and negative except as per HPI.   Current Outpatient Prescriptions  Medication Sig Dispense Refill  . ALPRAZolam (XANAX) 0.5 MG tablet Take 0.5 mg by mouth at bedtime as needed.      . Calcium Carbonate-Vitamin D (CALCIUM PLUS VITAMIN D) 300-100 MG-UNIT CAPS Take 4 tablets by mouth daily.    . carisoprodol (SOMA) 350 MG tablet Take 1 tablet (350 mg total) by mouth 2 (two) times daily as needed. (Patient taking differently: Take 350 mg by mouth as directed. ) 180 tablet 1  . Clidinium-Chlordiazepoxide (LIBRAX PO) Take by mouth as needed.      . fish oil-omega-3 fatty acids 1000 MG capsule Take 2 g by mouth daily.    Marland Kitchen isometheptene-acetaminophen-dichloralphenazone (MIDRIN) 65-325-100 MG capsule Take 1 to 2 capsules once a day as needed for tension headaches 90 capsule 1  . lansoprazole (PREVACID) 30 MG capsule Take  30 mg by mouth 2 (two) times daily. As needed    . levothyroxine (SYNTHROID) 50 MCG tablet TAKE 1 TABLET DAILY    . morphine (KADIAN) 100 MG 24 hr capsule Take 100 mg by mouth 2 (two) times daily.     . Multiple Vitamins-Minerals (CENTRUM SILVER PO) Take 1 tablet by mouth daily.    . paregoric 2 MG/5ML solution Take 1 teaspoon by mouth every 8-12 hours as needed for severe diarrhea 240 mL 1  . Suvorexant (BELSOMRA) 10 MG TABS Take 1 tablet by mouth at bedtime as needed. 30 tablet 1  . zolpidem (AMBIEN CR) 12.5 MG CR tablet Take 1 tablet (12.5 mg total) by mouth at bedtime as needed. 90 tablet 1  . aspirin EC 81 MG tablet Take 1 tablet (81 mg total) by mouth daily.     No current facility-administered medications for this visit.    BP 136/92 mmHg  Pulse 96  Ht 5\' 5"  (1.651 m)  Wt 118 lb (53.524 kg)  BMI 19.64 kg/m2 General: NAD Neck: No JVD, no thyromegaly or thyroid nodule.  Lungs: Clear to auscultation bilaterally with normal respiratory effort. CV: Nondisplaced PMI.  Heart regular S1/S2, no S3/S4, no murmur.  No peripheral edema.  No carotid bruit.  Normal pedal pulses.  Abdomen: Soft, nontender, no hepatosplenomegaly, no distention.  Skin: Intact without lesions or rashes.  Neurologic: Alert and oriented  x 3.  Psych: Normal affect. Extremities: No clubbing or cyanosis.  HEENT: Normal.  Assessment/Plan: 1. Coronary disease risk: Patient has no ischemic symptoms.  She is active.  She follows an excellent diet.  She does not smoke. Her main risk factor is a family history of premature CAD (mother and brother).  LDL particle number was elevated in 7/14.  - Await repeat NMR lipid profile (to be done at Dr [facility] office soon).  - I will arrange for a coronary calcium score.  If this is elevated or if lipid profile is unfavorable, would recommend statin iniation.   - It would be reasonable for her to start ASA 81 mg daily given her family history.  2. Hyperlipidemia: See discussion  above.  3. Abnormal ECG: She does appear to have an ectopic atrial rhythm.  HR is not elevated.  No intervention needed at this time.   Loralie Champagne 08/23/2014

## 2014-08-25 ENCOUNTER — Ambulatory Visit: Payer: BC Managed Care – PPO

## 2014-08-27 ENCOUNTER — Telehealth: Payer: Self-pay | Admitting: Cardiology

## 2014-08-27 NOTE — Telephone Encounter (Signed)
New message ° ° ° ° ° °Returning Anne's call to get test results °

## 2014-08-27 NOTE — Telephone Encounter (Signed)
Spoke with patient about her coronary calcium score, and Dr. Claris Gladden recommendations for lipitor. She will see her PCP in a few weeks and have a lipid profile. Told her i would forward this to her Pcp, Dr Morrie Sheldon. Said she would wait till after labs to start new medication.

## 2014-08-27 NOTE — Telephone Encounter (Signed)
Left pt a message to call back. 

## 2014-08-27 NOTE — Telephone Encounter (Signed)
Left message for patient to call back  

## 2014-09-17 ENCOUNTER — Ambulatory Visit: Payer: BC Managed Care – PPO | Admitting: Physical Therapy

## 2014-10-19 ENCOUNTER — Telehealth: Payer: Self-pay | Admitting: Family Medicine

## 2014-10-20 NOTE — Telephone Encounter (Signed)
Labs ordered on script - she will have them done at [facility] in [place], [place]

## 2014-10-26 ENCOUNTER — Encounter: Payer: Self-pay | Admitting: Cardiology

## 2014-10-26 ENCOUNTER — Ambulatory Visit: Payer: BC Managed Care – PPO | Admitting: Family Medicine

## 2014-11-02 ENCOUNTER — Ambulatory Visit (INDEPENDENT_AMBULATORY_CARE_PROVIDER_SITE_OTHER): Payer: BLUE CROSS/BLUE SHIELD | Admitting: Family Medicine

## 2014-11-02 ENCOUNTER — Encounter: Payer: Self-pay | Admitting: Family Medicine

## 2014-11-02 VITALS — BP 123/82 | HR 91 | Temp 97.9°F | Ht 65.0 in | Wt 117.0 lb

## 2014-11-02 DIAGNOSIS — Z8249 Family history of ischemic heart disease and other diseases of the circulatory system: Secondary | ICD-10-CM

## 2014-11-02 DIAGNOSIS — E785 Hyperlipidemia, unspecified: Secondary | ICD-10-CM

## 2014-11-02 DIAGNOSIS — E559 Vitamin D deficiency, unspecified: Secondary | ICD-10-CM

## 2014-11-02 DIAGNOSIS — G4700 Insomnia, unspecified: Secondary | ICD-10-CM

## 2014-11-02 DIAGNOSIS — E039 Hypothyroidism, unspecified: Secondary | ICD-10-CM

## 2014-11-02 DIAGNOSIS — K219 Gastro-esophageal reflux disease without esophagitis: Secondary | ICD-10-CM

## 2014-11-02 MED ORDER — CARISOPRODOL 350 MG PO TABS: 350.0000 mg | ORAL_TABLET | Freq: Two times a day (BID) | ORAL | Status: DC | PRN

## 2014-11-02 MED ORDER — ALPRAZOLAM 0.5 MG PO TABS: 0.5000 mg | ORAL_TABLET | Freq: Every evening | ORAL | Status: DC | PRN

## 2014-11-02 MED ORDER — LEVOTHYROXINE SODIUM 50 MCG PO TABS
50.0000 ug | ORAL_TABLET | Freq: Every day | ORAL | Status: DC
Start: 2014-11-02 — End: 2015-05-24

## 2014-11-02 NOTE — Progress Notes (Signed)
Subjective:    Patient ID: Wanda Robertson, female    DOB: [birth year], 59 y.o.   MRN: [id number]  HPI Pt here for follow up and management of chronic medical problems which include hypothyroid and  Hyperlipidemia. She is taking medications regularly. The patient continues to have chronic neck pain and neuropathy. She is a Theme park manager. She has trouble with sleeping at nighttime. She is seeing pain management. She recently saw the cardiologist because of her strong family history of heart disease and he felt like the biggest thing to do dialysis to start a baby aspirin. He indicated that he would like her to be on a statin drug if her cholesterol numbers were elevated. We received an advanced lipid panel in the mail and we will review this with her today and make sure the cardiologist gets a copy of this. She was supposed to get a coronary calcium score and I'm not sure she has had this done yet or not. The patient continues to see Dr. Nicholaus Bloom for pain management and has an appointment coming up with him soon. She is also planning to see her gynecologist soon and she will get a DEXA scan there as this is past due to. We will discuss with the clinical pharmacists about the options for insomnia with this patient. We also need to check with her gastroenterologist regarding the do date for her next colonoscopy.        Patient Active Problem List   Diagnosis Date Noted  . 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 Family history of heart disease 02/25/2014  . Raynaud's disease 02/25/2014  . Hyperlipemia 05/15/2013  . GERD (gastroesophageal reflux disease) 05/15/2013  . Fibromyalgia 05/15/2013  . Chronic neck pain, with peripheral neuropathy 05/15/2013  . Osteoporosis 05/15/2013  . Hypothyroidism 05/15/2013  . Post menopausal syndrome 08/23/2011   Outpatient Encounter Prescriptions as of 11/02/2014  Medication Sig  . ALPRAZolam (XANAX) 0.5 MG tablet Take 0.5 mg by mouth at bedtime as needed.      Marland Kitchen aspirin EC 81 MG tablet Take 1 tablet (81 mg total) by mouth daily.  . Calcium Carbonate-Vitamin D (CALCIUM PLUS VITAMIN D) 300-100 MG-UNIT CAPS Take 4 tablets by mouth daily.  . carisoprodol (SOMA) 350 MG tablet Take 1 tablet (350 mg total) by mouth 2 (two) times daily as needed. (Patient taking differently: Take 350 mg by mouth as directed. )  . Clidinium-Chlordiazepoxide (LIBRAX PO) Take by mouth as needed.    . clobetasol (TEMOVATE) 0.05 % GEL Apply topically 2 (two) times daily.  . fish oil-omega-3 fatty acids 1000 MG capsule Take 2 g by mouth daily.  Marland Kitchen isometheptene-acetaminophen-dichloralphenazone (MIDRIN) 65-325-100 MG capsule Take 1 to 2 capsules once a day as needed for tension headaches  . lansoprazole (PREVACID) 30 MG capsule Take 30 mg by mouth 2 (two) times daily. As needed  . levothyroxine (SYNTHROID) 50 MCG tablet TAKE 1 TABLET DAILY  . morphine (KADIAN) 100 MG 24 hr capsule Take 100 mg by mouth 2 (two) times daily.   . Multiple Vitamins-Minerals (CENTRUM SILVER PO) Take 1 tablet by mouth daily.  . paregoric 2 MG/5ML solution Take 1 teaspoon by mouth every 8-12 hours as needed for severe diarrhea  . traMADol (ULTRAM) 50 MG tablet Take 50 mg by mouth 2 (two) times daily.  Marland Kitchen UNABLE TO FIND Med Name: NirtoBID cream - from rheumotology  . zolpidem (AMBIEN CR) 12.5 MG CR tablet Take 1 tablet (12.5 mg total) by mouth at bedtime as  needed.  . Suvorexant (BELSOMRA) 10 MG TABS Take 1 tablet by mouth at bedtime as needed. (Patient not taking: Reported on 11/02/2014)    Review of Systems  Constitutional: Negative.        Not sleeping well  HENT: Negative.   Eyes: Negative.   Respiratory: Negative.   Cardiovascular: Negative.   Gastrointestinal: Negative.   Endocrine: Negative.   Genitourinary: Negative.   Musculoskeletal: Negative.   Skin: Negative.   Allergic/Immunologic: Negative.   Neurological: Negative.   Hematological: Negative.   Psychiatric/Behavioral: Negative.         Objective:   Physical Exam  Constitutional: She is oriented to person, place, and time. She appears well-developed and well-nourished. No distress.  HENT:  Head: Normocephalic and atraumatic.  Right Ear: External ear normal.  Left Ear: External ear normal.  Nose: Nose normal.  Mouth/Throat: Oropharynx is clear and moist.  Eyes: Conjunctivae and EOM are normal. Pupils are equal, round, and reactive to light. Right eye exhibits no discharge. Left eye exhibits no discharge. No scleral icterus.  Neck: Normal range of motion. Neck supple. No thyromegaly present.  Neck without bruits or thyromegaly  Cardiovascular: Normal rate, regular rhythm, normal heart sounds and intact distal pulses.  Exam reveals no gallop and no friction rub.   No murmur heard. At 72/m  Pulmonary/Chest: Effort normal and breath sounds normal. No respiratory distress. She has no wheezes. She has no rales. She exhibits no tenderness.  Lungs are clear anteriorly and posteriorly  Abdominal: Soft. Bowel sounds are normal. She exhibits no mass. There is no tenderness. There is no rebound and no guarding.  No abdominal tenderness masses or organ enlargement  Musculoskeletal: Normal range of motion. She exhibits no edema or tenderness.  Lymphadenopathy:    She has no cervical adenopathy.  Neurological: She is alert and oriented to person, place, and time. She has normal reflexes. No cranial nerve deficit.  Skin: Skin is warm and dry. No rash noted.  Psychiatric: She has a normal mood and affect. Her behavior is normal. Judgment and thought content normal.  Nursing note and vitals reviewed.  BP 123/82 mmHg  Pulse 91  Temp(Src) 97.9 F (36.6 C) (Oral)  Ht 5\' 5"  (1.651 m)  Wt 117 lb (53.071 kg)  BMI 19.47 kg/m2        Assessment & Plan:  1. Gastroesophageal reflux disease, esophagitis presence not specified -Continue Prevacid and avoid irritating foods that aggravate your stomach  2. Hyperlipemia -Continue  aggressive therapeutic lifestyle changes  3. Hypothyroidism, unspecified hypothyroidism type -Continue current thyroid medication as labs were good and within normal limits  4. Vitamin D deficiency -Increase vitamin D3 to 2000 Monday through Friday and she  5. Family history of heart disease -Continue periodic follow-up with cardiology  6. Insomnia -Continue Ambien when not taking Belsomra  Meds ordered this encounter  Medications  . traMADol (ULTRAM) 50 MG tablet    Sig: Take 50 mg by mouth 2 (two) times daily.  Marland Kitchen UNABLE TO FIND    Sig: Med Name: NirtoBID cream - from rheumotology  . clobetasol (TEMOVATE) 0.05 % GEL    Sig: Apply topically 2 (two) times daily.  Marland Kitchen ALPRAZolam (XANAX) 0.5 MG tablet    Sig: Take 1 tablet (0.5 mg total) by mouth at bedtime as needed.    Dispense:  90 tablet    Refill:  1  . carisoprodol (SOMA) 350 MG tablet    Sig: Take 1 tablet (350 mg total) by mouth  2 (two) times daily as needed.    Dispense:  180 tablet    Refill:  1  . levothyroxine (SYNTHROID) 50 MCG tablet    Sig: Take 1 tablet (50 mcg total) by mouth daily.    Dispense:  90 tablet    Refill:  3   Patient Instructions  Continue current medications. Continue good therapeutic lifestyle changes which include good diet and exercise. Fall precautions discussed with patient. If an FOBT was given today- please return it to our front desk. If you are over 70 years old - you may need Prevnar 21 or the adult Pneumonia vaccine.  Flu Shots will be available at our office starting mid- September. Please call and schedule a FLU CLINIC APPOINTMENT.   Copy of lab work to Dr Aundra Dubin Check colonoscopy date Get Dexa at  GYN Will talk with with clinical pharmacy re: Derryl Harbor MD

## 2014-11-02 NOTE — Patient Instructions (Addendum)
Continue current medications. Continue good therapeutic lifestyle changes which include good diet and exercise. Fall precautions discussed with patient. If an FOBT was given today- please return it to our front desk. If you are over 59 years old - you may need Prevnar 25 or the adult Pneumonia vaccine.  Flu Shots will be available at our office starting mid- September. Please call and schedule a FLU CLINIC APPOINTMENT.   Copy of lab work to Dr Aundra Dubin Check colonoscopy date Get Dexa at  GYN Will talk with with clinical pharmacy re: Elmarie Mainland

## 2014-11-03 MED ORDER — SUVOREXANT 15 MG PO TABS: 1.0000 | ORAL_TABLET | Freq: Every evening | ORAL | Status: DC | PRN

## 2014-11-03 NOTE — Addendum Note (Signed)
Addended by: Zannie Cove on: 11/03/2014 12:05 PM   Modules accepted: Orders

## 2014-11-16 ENCOUNTER — Encounter: Payer: Self-pay | Admitting: *Deleted

## 2014-12-10 ENCOUNTER — Other Ambulatory Visit: Payer: Self-pay | Admitting: Obstetrics & Gynecology

## 2014-12-11 LAB — CYTOLOGY - PAP

## 2014-12-30 ENCOUNTER — Encounter: Payer: Self-pay | Admitting: Internal Medicine

## 2015-04-30 ENCOUNTER — Ambulatory Visit: Payer: BLUE CROSS/BLUE SHIELD | Admitting: Family Medicine

## 2015-05-03 ENCOUNTER — Ambulatory Visit: Payer: BLUE CROSS/BLUE SHIELD | Admitting: Family Medicine

## 2015-05-12 ENCOUNTER — Encounter: Payer: Self-pay | Admitting: *Deleted

## 2015-05-24 ENCOUNTER — Ambulatory Visit (INDEPENDENT_AMBULATORY_CARE_PROVIDER_SITE_OTHER): Payer: BLUE CROSS/BLUE SHIELD | Admitting: Family Medicine

## 2015-05-24 ENCOUNTER — Encounter: Payer: Self-pay | Admitting: Family Medicine

## 2015-05-24 VITALS — BP 115/79 | HR 97 | Temp 97.5°F | Ht 65.0 in | Wt 113.0 lb

## 2015-05-24 DIAGNOSIS — K219 Gastro-esophageal reflux disease without esophagitis: Secondary | ICD-10-CM | POA: Diagnosis not present

## 2015-05-24 DIAGNOSIS — E559 Vitamin D deficiency, unspecified: Secondary | ICD-10-CM

## 2015-05-24 DIAGNOSIS — E785 Hyperlipidemia, unspecified: Secondary | ICD-10-CM

## 2015-05-24 DIAGNOSIS — M797 Fibromyalgia: Secondary | ICD-10-CM

## 2015-05-24 DIAGNOSIS — E039 Hypothyroidism, unspecified: Secondary | ICD-10-CM

## 2015-05-24 DIAGNOSIS — Z8249 Family history of ischemic heart disease and other diseases of the circulatory system: Secondary | ICD-10-CM

## 2015-05-24 MED ORDER — CLOBETASOL PROPIONATE 0.05 % EX GEL: CUTANEOUS | Status: DC

## 2015-05-24 MED ORDER — PAREGORIC 2 MG/5ML PO TINC: ORAL | Status: DC

## 2015-05-24 MED ORDER — CARISOPRODOL 350 MG PO TABS
350.0000 mg | ORAL_TABLET | Freq: Two times a day (BID) | ORAL | Status: DC | PRN
Start: 2015-05-24 — End: 2015-12-13

## 2015-05-24 MED ORDER — ALPRAZOLAM 0.5 MG PO TABS
0.5000 mg | ORAL_TABLET | Freq: Every evening | ORAL | Status: DC | PRN
Start: 2015-05-24 — End: 2016-06-12

## 2015-05-24 MED ORDER — LEVOTHYROXINE SODIUM 50 MCG PO TABS: 50.0000 ug | ORAL_TABLET | Freq: Every day | ORAL | Status: DC

## 2015-05-24 NOTE — Patient Instructions (Addendum)
Continue current medications. Continue good therapeutic lifestyle changes which include good diet and exercise. Fall precautions discussed with patient. If an FOBT was given today- please return it to our front desk. If you are over 60 years old - you may need Prevnar 59 or the adult Pneumonia vaccine.   After your visit with [place] today you will receive a survey in the mail or online from [employer] regarding your care with [place]. Please take a moment to fill this out. Your feedback is very important to [place] as you can help [place] better understand your patient needs as well as improve your experience and satisfaction. WE CARE ABOUT YOU!!!   Continue to follow-up with pain management Make sure that you get your mammogram If you continue to have problems with the skin on your back please feel free to let us look at this again at some point in the future Check with your insurance regarding the Prevnar vaccine We will call you with your lab work results once these result become available Please give the gastroenterologist to call and confirm the date of your next colonoscopy

## 2015-05-24 NOTE — Progress Notes (Signed)
Subjective:    Patient ID: Wanda Robertson, female    DOB: [birth year], 59 y.o.   MRN: [id number]  HPI Pt here for follow up and management of chronic medical problems which includes hypothyroid and  Hyperlipidemia. She is taking medications regularly. The patient is also followed by the pain clinic for her most of her pain medications. We do prescribe Soma and medicines for insomnia. She does complain of a skin lesion on her back. She is due to get lab work and due to get her mammogram.     Patient Active Problem List   Diagnosis Date Noted  . 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 Family history of heart disease 02/25/2014  . Raynaud's disease 02/25/2014  . Hyperlipemia 05/15/2013  . GERD (gastroesophageal reflux disease) 05/15/2013  . Fibromyalgia 05/15/2013  . Chronic neck pain, with peripheral neuropathy 05/15/2013  . Osteoporosis 05/15/2013  . Hypothyroidism 05/15/2013  . Post menopausal syndrome 08/23/2011   Outpatient Encounter Prescriptions as of 05/24/2015  Medication Sig  . ALPRAZolam (XANAX) 0.5 MG tablet Take 1 tablet (0.5 mg total) by mouth at bedtime as needed.  Marland Kitchen aspirin EC 81 MG tablet Take 1 tablet (81 mg total) by mouth daily.  . Calcium Carbonate-Vitamin D (CALCIUM PLUS VITAMIN D) 300-100 MG-UNIT CAPS Take 4 tablets by mouth daily.  . carisoprodol (SOMA) 350 MG tablet Take 1 tablet (350 mg total) by mouth 2 (two) times daily as needed.  . Clidinium-Chlordiazepoxide (LIBRAX PO) Take by mouth as needed.    . clobetasol (TEMOVATE) 0.05 % GEL Apply to affected area BID PRN  . fish oil-omega-3 fatty acids 1000 MG capsule Take 2 g by mouth daily.  Marland Kitchen isometheptene-acetaminophen-dichloralphenazone (MIDRIN) 65-325-100 MG capsule Take 1 to 2 capsules once a day as needed for tension headaches  . lansoprazole (PREVACID) 30 MG capsule Take 30 mg by mouth 2 (two) times daily. As needed  . levothyroxine (SYNTHROID) 50 MCG tablet Take 1 tablet (50 mcg total) by mouth daily.  Marland Kitchen  morphine (KADIAN) 100 MG 24 hr capsule Take 100 mg by mouth 2 (two) times daily.   . Multiple Vitamins-Minerals (CENTRUM SILVER PO) Take 1 tablet by mouth daily.  . paregoric 2 MG/5ML solution Take 1 teaspoon by mouth every 8-12 hours as needed for severe diarrhea  . Suvorexant (BELSOMRA) 15 MG TABS Take 1 tablet by mouth at bedtime as needed.  . traMADol (ULTRAM) 50 MG tablet Take 50 mg by mouth 2 (two) times daily.  Marland Kitchen UNABLE TO FIND Med Name: NirtoBID cream - from rheumotology  . zolpidem (AMBIEN CR) 12.5 MG CR tablet Take 1 tablet (12.5 mg total) by mouth at bedtime as needed.  . [DISCONTINUED] ALPRAZolam (XANAX) 0.5 MG tablet Take 1 tablet (0.5 mg total) by mouth at bedtime as needed.  . [DISCONTINUED] carisoprodol (SOMA) 350 MG tablet Take 1 tablet (350 mg total) by mouth 2 (two) times daily as needed.  . [DISCONTINUED] clobetasol (TEMOVATE) 0.05 % GEL Apply topically 2 (two) times daily.  . [DISCONTINUED] levothyroxine (SYNTHROID) 50 MCG tablet Take 1 tablet (50 mcg total) by mouth daily.  . [DISCONTINUED] paregoric 2 MG/5ML solution Take 1 teaspoon by mouth every 8-12 hours as needed for severe diarrhea   No facility-administered encounter medications on file as of 05/24/2015.      Review of Systems  Constitutional: Negative.   HENT: Negative.   Eyes: Negative.   Respiratory: Negative.   Cardiovascular: Negative.   Gastrointestinal: Negative.   Endocrine: Negative.  Genitourinary: Negative.   Musculoskeletal: Negative.   Skin: Negative.        Lesion on back  Allergic/Immunologic: Negative.   Neurological: Negative.   Hematological: Negative.   Psychiatric/Behavioral: Negative.        Objective:   Physical Exam  Constitutional: She is oriented to person, place, and time. She appears well-developed and well-nourished. No distress.  HENT:  Head: Normocephalic and atraumatic.  Right Ear: External ear normal.  Left Ear: External ear normal.  Nose: Nose normal.    Mouth/Throat: Oropharynx is clear and moist.  Eyes: Conjunctivae and EOM are normal. Pupils are equal, round, and reactive to light. Right eye exhibits no discharge. Left eye exhibits no discharge. No scleral icterus.  Neck: Normal range of motion. Neck supple. No thyromegaly present.  Cardiovascular: Normal rate, regular rhythm, normal heart sounds and intact distal pulses.  Exam reveals no gallop and no friction rub.   No murmur heard. At 72/m  Pulmonary/Chest: Effort normal and breath sounds normal. No respiratory distress. She has no wheezes. She has no rales. She exhibits no tenderness.  Clear anteriorly and posteriorly  Abdominal: Soft. Bowel sounds are normal. She exhibits no mass. There is no tenderness. There is no rebound and no guarding.  Nontender without masses or organ enlargement or inguinal adenopathy  Musculoskeletal: Normal range of motion. She exhibits no edema or tenderness.  Lymphadenopathy:    She has no cervical adenopathy.  Neurological: She is alert and oriented to person, place, and time. She has normal reflexes. No cranial nerve deficit.  Skin: Skin is warm and dry. No rash noted.  There was no obvious skin lesion on the patient's back to be concerned with.  Psychiatric: She has a normal mood and affect. Her behavior is normal. Judgment and thought content normal.  Nursing note and vitals reviewed.  BP 115/79 mmHg  Pulse 97  Temp(Src) 97.5 F (36.4 C) (Oral)  Ht _0  (1.651 m)  Wt 113 lb (51.256 kg)  BMI 18.80 kg/m2        Assessment & Plan:  1. Gastroesophageal reflux disease, esophagitis presence not specified -Patient has no complaints with this today. - CBC with Differential/Platelet  2. Hyperlipemia -The last cholesterol numbers were good and no changes will be made in this management unless there is a abnormality on the current blood work being done today. - BMP8+EGFR - Hepatic function panel - Lipid panel - CBC with  Differential/Platelet  3. Hypothyroidism, unspecified hypothyroidism type -The patient will continue with current treatment pending results of lab work - Thyroid Panel With TSH - CBC with Differential/Platelet  4. Vitamin D deficiency -Continue with current treatment pending results of lab work - Vit D  25 hydroxy (rtn osteoporosis monitoring) - CBC with Differential/Platelet  5. Family history of heart disease -The patient has no complaints relating to her heart and she has seen the cardiologist in the past year with a good report - BMP8+EGFR - Hepatic function panel - CBC with Differential/Platelet  6. Fibromyalgia -She continues to have problems with her fibromyalgia is followed by pain management for this.  Meds ordered this encounter  Medications  . ALPRAZolam (XANAX) 0.5 MG tablet    Sig: Take 1 tablet (0.5 mg total) by mouth at bedtime as needed.    Dispense:  90 tablet    Refill:  1  . carisoprodol (SOMA) 350 MG tablet    Sig: Take 1 tablet (350 mg total) by mouth 2 (two) times daily as needed.  Dispense:  180 tablet    Refill:  1  . levothyroxine (SYNTHROID) 50 MCG tablet    Sig: Take 1 tablet (50 mcg total) by mouth daily.    Dispense:  90 tablet    Refill:  3  . clobetasol (TEMOVATE) 0.05 % GEL    Sig: Apply to affected area BID PRN    Dispense:  30 each    Refill:  3  . paregoric 2 MG/5ML solution    Sig: Take 1 teaspoon by mouth every 8-12 hours as needed for severe diarrhea    Dispense:  240 mL    Refill:  1   Patient Instructions  Continue current medications. Continue good therapeutic lifestyle changes which include good diet and exercise. Fall precautions discussed with patient. If an FOBT was given today- please return it to our front desk. If you are over 12 years old - you may need Prevnar 77 or the adult Pneumonia vaccine.   After your visit with [place] today you will receive a survey in the mail or online from [employer] regarding your care  with [place]. Please take a moment to fill this out. Your feedback is very important to [place] as you can help [place] better understand your patient needs as well as improve your experience and satisfaction. WE CARE ABOUT YOU!!!   Continue to follow-up with pain management Make sure that you get your mammogram If you continue to have problems with the skin on your back please feel free to let us look at this again at some point in the future Check with your insurance regarding the Prevnar vaccine We will call you with your lab work results once these result become available Please give the gastroenterologist to call and confirm the date of your next colonoscopy   Arrie Senate MD

## 2015-05-25 LAB — BMP8+EGFR
BUN / CREAT RATIO: 21 (ref 9–23)
BUN: 15 mg/dL (ref 6–24)
CALCIUM: 9 mg/dL (ref 8.7–10.2)
CO2: 23 mmol/L (ref 18–29)
Chloride: 101 mmol/L (ref 97–108)
Creatinine, Ser: 0.73 mg/dL (ref 0.57–1.00)
GFR calc Af Amer: 105 mL/min/{1.73_m2} (ref >59)
GFR calc non Af Amer: 91 mL/min/{1.73_m2} (ref >59)
GLUCOSE: 74 mg/dL (ref 65–99)
Potassium: 4.8 mmol/L (ref 3.5–5.2)
Sodium: 142 mmol/L (ref 134–144)

## 2015-05-25 LAB — LIPID PANEL
CHOL/HDL RATIO: 2.8 ratio (ref 0.0–4.4)
Cholesterol, Total: 164 mg/dL (ref 100–199)
HDL: 58 mg/dL (ref >39)
LDL Calculated: 95 mg/dL (ref 0–99)
Triglycerides: 56 mg/dL (ref 0–149)
VLDL Cholesterol Cal: 11 mg/dL (ref 5–40)

## 2015-05-25 LAB — THYROID PANEL WITH TSH
FREE THYROXINE INDEX: 2.3 (ref 1.2–4.9)
T3 UPTAKE RATIO: 28 % (ref 24–39)
T4 TOTAL: 8.1 ug/dL (ref 4.5–12.0)
TSH: 1.64 u[IU]/mL (ref 0.450–4.500)

## 2015-05-25 LAB — CBC WITH DIFFERENTIAL/PLATELET
Basophils Absolute: 0 10*3/uL (ref 0.0–0.2)
Basos: 0 %
EOS (ABSOLUTE): 0 10*3/uL (ref 0.0–0.4)
Eos: 1 %
HEMATOCRIT: 41.3 % (ref 34.0–46.6)
Hemoglobin: 13.5 g/dL (ref 11.1–15.9)
IMMATURE GRANS (ABS): 0 10*3/uL (ref 0.0–0.1)
IMMATURE GRANULOCYTES: 0 %
Lymphocytes Absolute: 1.6 10*3/uL (ref 0.7–3.1)
Lymphs: 32 %
MCH: 31.8 pg (ref 26.6–33.0)
MCHC: 32.7 g/dL (ref 31.5–35.7)
MCV: 97 fL (ref 79–97)
Monocytes Absolute: 0.4 10*3/uL (ref 0.1–0.9)
Monocytes: 8 %
Neutrophils Absolute: 3 10*3/uL (ref 1.4–7.0)
Neutrophils: 59 %
Platelets: 235 10*3/uL (ref 150–379)
RBC: 4.24 x10E6/uL (ref 3.77–5.28)
RDW: 13.3 % (ref 12.3–15.4)
WBC: 5.1 10*3/uL (ref 3.4–10.8)

## 2015-05-25 LAB — HEPATIC FUNCTION PANEL
ALBUMIN: 4.3 g/dL (ref 3.5–5.5)
ALT: 14 IU/L (ref 0–32)
AST: 18 IU/L (ref 0–40)
Alkaline Phosphatase: 50 IU/L (ref 39–117)
Bilirubin Total: 0.3 mg/dL (ref 0.0–1.2)
Bilirubin, Direct: 0.08 mg/dL (ref 0.00–0.40)
Total Protein: 7 g/dL (ref 6.0–8.5)

## 2015-05-25 LAB — VITAMIN D 25 HYDROXY (VIT D DEFICIENCY, FRACTURES): Vit D, 25-Hydroxy: 49.9 ng/mL (ref 30.0–100.0)

## 2015-12-13 ENCOUNTER — Ambulatory Visit (INDEPENDENT_AMBULATORY_CARE_PROVIDER_SITE_OTHER): Payer: BLUE CROSS/BLUE SHIELD | Admitting: Family Medicine

## 2015-12-13 ENCOUNTER — Encounter: Payer: Self-pay | Admitting: Family Medicine

## 2015-12-13 VITALS — BP 117/79 | HR 88 | Temp 98.0°F | Ht 65.0 in | Wt 116.0 lb

## 2015-12-13 DIAGNOSIS — E785 Hyperlipidemia, unspecified: Secondary | ICD-10-CM

## 2015-12-13 DIAGNOSIS — M542 Cervicalgia: Secondary | ICD-10-CM

## 2015-12-13 DIAGNOSIS — Z8249 Family history of ischemic heart disease and other diseases of the circulatory system: Secondary | ICD-10-CM

## 2015-12-13 DIAGNOSIS — E039 Hypothyroidism, unspecified: Secondary | ICD-10-CM

## 2015-12-13 DIAGNOSIS — E559 Vitamin D deficiency, unspecified: Secondary | ICD-10-CM

## 2015-12-13 DIAGNOSIS — M797 Fibromyalgia: Secondary | ICD-10-CM | POA: Diagnosis not present

## 2015-12-13 DIAGNOSIS — G4700 Insomnia, unspecified: Secondary | ICD-10-CM

## 2015-12-13 DIAGNOSIS — K219 Gastro-esophageal reflux disease without esophagitis: Secondary | ICD-10-CM

## 2015-12-13 MED ORDER — SUVOREXANT 20 MG PO TABS: 1.0000 | ORAL_TABLET | Freq: Every evening | ORAL | Status: DC | PRN

## 2015-12-13 MED ORDER — CARISOPRODOL 350 MG PO TABS: 350.0000 mg | ORAL_TABLET | Freq: Two times a day (BID) | ORAL | Status: DC | PRN

## 2015-12-13 NOTE — Progress Notes (Signed)
Subjective:    Patient ID: Wanda Robertson, female    DOB: [birth year], 60 y.o.   MRN: [id number]  HPI Pt here for follow up and management of chronic medical problems which includes hyperlipidemia and hypothyroid. He is taking medications regularly. 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 She has a positive family history for heart disease in her mother and father. Her son of 46 years of age and is in good health. She denies any chest pain palpitations shortness of breath trouble swallowing heartburn indigestion and nausea vomiting diarrhea or blood in the stool black tarry bowel movements or abdominal pain. She is passing her water without problems. She has a gynecological appointment with Dr. Colin Mulders this week. She is supposed to call and get an appointment with her cardiologist after one year. She is passing her water without problems.   Patient Active Problem List   Diagnosis Date Noted  . 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 Family history of heart disease 02/25/2014  . Raynaud's disease 02/25/2014  . Hyperlipemia 05/15/2013  . GERD (gastroesophageal reflux disease) 05/15/2013  . Fibromyalgia 05/15/2013  . Chronic neck pain, with peripheral neuropathy 05/15/2013  . Osteoporosis 05/15/2013  . Hypothyroidism 05/15/2013  . Post menopausal syndrome 08/23/2011   Outpatient Encounter Prescriptions as of 12/13/2015  Medication Sig  . ALPRAZolam (XANAX) 0.5 MG tablet Take 1 tablet (0.5 mg total) by mouth at bedtime as needed.  Marland Kitchen aspirin EC 81 MG tablet Take 1 tablet (81 mg total) by mouth daily.  . Calcium Carbonate-Vitamin D (CALCIUM PLUS VITAMIN D) 300-100 MG-UNIT CAPS Take 4 tablets by mouth daily.  . carisoprodol (SOMA) 350 MG tablet Take 1 tablet (350 mg total) by mouth 2 (two) times daily as needed.  . Clidinium-Chlordiazepoxide (LIBRAX PO) Take by mouth as needed.    . clobetasol (TEMOVATE)  0.05 % GEL Apply to affected area BID PRN  . fish oil-omega-3 fatty acids 1000 MG capsule Take 2 g by mouth daily.  Marland Kitchen isometheptene-acetaminophen-dichloralphenazone (MIDRIN) 65-325-100 MG capsule Take 1 to 2 capsules once a day as needed for tension headaches  . levothyroxine (SYNTHROID) 50 MCG tablet Take 1 tablet (50 mcg total) by mouth daily.  Marland Kitchen morphine (KADIAN) 100 MG 24 hr capsule Take 100 mg by mouth 2 (two) times daily.   . Multiple Vitamins-Minerals (CENTRUM SILVER PO) Take 1 tablet by mouth daily.  . paregoric 2 MG/5ML solution Take 1 teaspoon by mouth every 8-12 hours as needed for severe diarrhea  . Suvorexant (BELSOMRA) 15 MG TABS Take 1 tablet by mouth at bedtime as needed.  . traMADol (ULTRAM) 50 MG tablet Take 50 mg by mouth 2 (two) times daily.  Marland Kitchen UNABLE TO FIND Med Name: NirtoBID cream - from rheumotology  . zolpidem (AMBIEN CR) 12.5 MG CR tablet Take 1 tablet (12.5 mg total) by mouth at bedtime as needed.  . [DISCONTINUED] carisoprodol (SOMA) 350 MG tablet Take 1 tablet (350 mg total) by mouth 2 (two) times daily as needed.  . [DISCONTINUED] lansoprazole (PREVACID) 30 MG capsule Take 30 mg by mouth 2 (two) times daily. As needed   No facility-administered encounter medications on file as of 12/13/2015.      Review of Systems  Constitutional: Negative.   HENT: Negative.   Eyes: Negative.   Respiratory: Negative.   Cardiovascular: Negative.   Gastrointestinal: Negative.   Endocrine: Negative.   Genitourinary: Negative.   Musculoskeletal:  Negative.   Skin: Negative.   Allergic/Immunologic: Negative.   Neurological: Negative.   Hematological: Negative.   Psychiatric/Behavioral: Negative.        Objective:   Physical Exam  Constitutional: She is oriented to person, place, and time. She appears well-developed and well-nourished. No distress.  Small framed but healthy appearing and alert  HENT:  Head: Normocephalic and atraumatic.  Right Ear: External ear normal.    Left Ear: External ear normal.  Nose: Nose normal.  Mouth/Throat: Oropharynx is clear and moist. No oropharyngeal exudate.  Eyes: Conjunctivae and EOM are normal. Pupils are equal, round, and reactive to light. Right eye exhibits no discharge. Left eye exhibits no discharge. No scleral icterus.  Neck: Normal range of motion. Neck supple. No thyromegaly present.  Cardiovascular: Normal rate, regular rhythm, normal heart sounds and intact distal pulses.   No murmur heard. Pulmonary/Chest: Effort normal and breath sounds normal. No respiratory distress. She has no wheezes. She has no rales. She exhibits no tenderness.  Abdominal: Soft. Bowel sounds are normal. She exhibits no mass. There is no tenderness. There is no rebound and no guarding.  Musculoskeletal: Normal range of motion. She exhibits no edema or tenderness.  Good range of motion of all extremities  Lymphadenopathy:    She has no cervical adenopathy.  Neurological: She is alert and oriented to person, place, and time. She has normal reflexes. No cranial nerve deficit.  Skin: Skin is warm and dry. No rash noted.  Psychiatric: She has a normal mood and affect. Her behavior is normal. Judgment and thought content normal.  Nursing note and vitals reviewed.   BP 117/79 mmHg  Pulse 88  Temp(Src) 98 F (36.7 C) (Oral)  Ht '5\' 5"'  (1.651 m)  Wt 116 lb (52.617 kg)  BMI 19.30 kg/m2       Assessment & Plan:  1. Hyperlipemia -Continue aggressive therapeutic lifestyle changes pending results of lab. Continue omega-3 fatty acids - BMP8+EGFR - CBC with Differential/Platelet - Hepatic function panel - Lipid panel  2. Hypothyroidism, unspecified hypothyroidism type -Continue current thyroid treatment which the patient says is 50 g one half tablet every other day - CBC with Differential/Platelet - Thyroid Panel With TSH  3. Vitamin D deficiency -Continue vitamin D replacement pending results of lab work - CBC with  Differential/Platelet - VITAMIN D 25 Hydroxy (Vit-D Deficiency, Fractures)  4. Gastroesophageal reflux disease, esophagitis presence not specified -This appears stable today and she will continue with watching her diet as closely as possible - CBC with Differential/Platelet - Hepatic function panel  5. Fibromyalgia -Continue with muscle relaxants and follow-up with pain management - CBC with Differential/Platelet  6. Family history of heart disease -Follow-up aggressive therapeutic lifestyle changes with diet and exercise - BMP8+EGFR - CBC with Differential/Platelet - Hepatic function panel  7. Insomnia -Belsomra 20 mg 1 at bedtime as needed  8. Cervicalgia -Continue follow-up with pain management  Meds ordered this encounter  Medications  . carisoprodol (SOMA) 350 MG tablet    Sig: Take 1 tablet (350 mg total) by mouth 2 (two) times daily as needed.    Dispense:  180 tablet    Refill:  1  . Suvorexant (BELSOMRA) 20 MG TABS    Sig: Take 1 tablet by mouth at bedtime as needed.    Dispense:  30 tablet    Refill:  3   Patient Instructions  Continue current medications. Continue good therapeutic lifestyle changes which include good diet and exercise. Fall precautions discussed  with patient. If an FOBT was given today- please return it to our front desk. If you are over 64 years old - you may need Prevnar 75 or the adult Pneumonia vaccine.  **Flu shots are available--- please call and schedule a FLU-CLINIC appointment**  After your visit with [place] today you will receive a survey in the mail or online from [employer] regarding your care with [place]. Please take a moment to fill this out. Your feedback is very important to [place] as you can help [place] better understand your patient needs as well as improve your experience and satisfaction. WE CARE ABOUT YOU!!!   Continue medications as directed Follow-up with pain management as planned Follow-up with GYN as planned Follow-up with  cardiology as planned Dr. Zenovia Jarred will be the gastroenterologist that you can request at your next visit with [facility]   Arrie Senate MD

## 2015-12-13 NOTE — Patient Instructions (Addendum)
Continue current medications. Continue good therapeutic lifestyle changes which include good diet and exercise. Fall precautions discussed with patient. If an FOBT was given today- please return it to our front desk. If you are over 60 years old - you may need Prevnar 27 or the adult Pneumonia vaccine.  **Flu shots are available--- please call and schedule a FLU-CLINIC appointment**  After your visit with [place] today you will receive a survey in the mail or online from [employer] regarding your care with [place]. Please take a moment to fill this out. Your feedback is very important to [place] as you can help [place] better understand your patient needs as well as improve your experience and satisfaction. WE CARE ABOUT YOU!!!   Continue medications as directed Follow-up with pain management as planned Follow-up with GYN as planned Follow-up with cardiology as planned Dr. Zenovia Jarred will be the gastroenterologist that you can request at your next visit with [facility]

## 2015-12-14 ENCOUNTER — Encounter: Payer: Self-pay | Admitting: *Deleted

## 2015-12-14 LAB — BMP8+EGFR
BUN / CREAT RATIO: 20 (ref 9–23)
BUN: 13 mg/dL (ref 6–24)
CHLORIDE: 101 mmol/L (ref 96–106)
CO2: 26 mmol/L (ref 18–29)
CREATININE: 0.66 mg/dL (ref 0.57–1.00)
Calcium: 9.2 mg/dL (ref 8.7–10.2)
GFR calc non Af Amer: 97 mL/min/{1.73_m2} (ref >59)
GFR, EST AFRICAN AMERICAN: 112 mL/min/{1.73_m2} (ref >59)
Glucose: 85 mg/dL (ref 65–99)
Potassium: 4.3 mmol/L (ref 3.5–5.2)
SODIUM: 141 mmol/L (ref 134–144)

## 2015-12-14 LAB — CBC WITH DIFFERENTIAL/PLATELET
Basophils Absolute: 0 10*3/uL (ref 0.0–0.2)
Basos: 0 %
EOS (ABSOLUTE): 0 10*3/uL (ref 0.0–0.4)
Eos: 0 %
Hematocrit: 38.9 % (ref 34.0–46.6)
Hemoglobin: 12.9 g/dL (ref 11.1–15.9)
Immature Grans (Abs): 0 10*3/uL (ref 0.0–0.1)
Immature Granulocytes: 0 %
LYMPHS ABS: 1.4 10*3/uL (ref 0.7–3.1)
Lymphs: 29 %
MCH: 32 pg (ref 26.6–33.0)
MCHC: 33.2 g/dL (ref 31.5–35.7)
MCV: 97 fL (ref 79–97)
Monocytes Absolute: 0.3 10*3/uL (ref 0.1–0.9)
Monocytes: 6 %
NEUTROS ABS: 3.2 10*3/uL (ref 1.4–7.0)
Neutrophils: 65 %
Platelets: 255 10*3/uL (ref 150–379)
RBC: 4.03 x10E6/uL (ref 3.77–5.28)
RDW: 13.1 % (ref 12.3–15.4)
WBC: 4.9 10*3/uL (ref 3.4–10.8)

## 2015-12-14 LAB — HEPATIC FUNCTION PANEL
ALT: 14 IU/L (ref 0–32)
AST: 19 IU/L (ref 0–40)
Albumin: 4 g/dL (ref 3.5–5.5)
Alkaline Phosphatase: 46 IU/L (ref 39–117)
Bilirubin Total: 0.3 mg/dL (ref 0.0–1.2)
Bilirubin, Direct: 0.09 mg/dL (ref 0.00–0.40)
TOTAL PROTEIN: 7.1 g/dL (ref 6.0–8.5)

## 2015-12-14 LAB — LIPID PANEL
Chol/HDL Ratio: 3.1 ratio units (ref 0.0–4.4)
Cholesterol, Total: 177 mg/dL (ref 100–199)
HDL: 58 mg/dL (ref >39)
LDL CALC: 104 mg/dL — AB (ref 0–99)
TRIGLYCERIDES: 74 mg/dL (ref 0–149)
VLDL Cholesterol Cal: 15 mg/dL (ref 5–40)

## 2015-12-14 LAB — THYROID PANEL WITH TSH
Free Thyroxine Index: 2 (ref 1.2–4.9)
T3 Uptake Ratio: 24 % (ref 24–39)
T4, Total: 8.4 ug/dL (ref 4.5–12.0)
TSH: 1.54 u[IU]/mL (ref 0.450–4.500)

## 2015-12-14 LAB — VITAMIN D 25 HYDROXY (VIT D DEFICIENCY, FRACTURES): Vit D, 25-Hydroxy: 51.5 ng/mL (ref 30.0–100.0)

## 2015-12-29 ENCOUNTER — Telehealth: Payer: Self-pay

## 2015-12-29 MED ORDER — ZOLPIDEM TARTRATE ER 12.5 MG PO TBCR: 12.5000 mg | EXTENDED_RELEASE_TABLET | Freq: Every evening | ORAL | Status: DC | PRN

## 2015-12-29 NOTE — Telephone Encounter (Signed)
Did Belsomra work? Please explained to the patient that if she is tried these other medicines and they don't work that we can seek an appeal to use Belsomra. If she does not want to do this we can go back to the Ambien.

## 2015-12-29 NOTE — Telephone Encounter (Signed)
Insurance denied Belsomra  Must try and fail two alternatives which are estalozam, eszopiclone, flurazepam, temazepam, triazolam, zaleplon, zolpidem and zolpidem ER

## 2015-12-29 NOTE — Addendum Note (Signed)
Addended by: Zannie Cove on: 12/29/2015 04:39 PM   Modules accepted: Orders

## 2015-12-29 NOTE — Telephone Encounter (Signed)
She had been on the ambien PRN as well - so you want to just switch back to this?

## 2015-12-29 NOTE — Telephone Encounter (Signed)
Spoke with pt and she prefers with just go back with the Ambien CR 12.5 - she wants a written RX to send in to mail order

## 2016-01-17 DIAGNOSIS — G894 Chronic pain syndrome: Secondary | ICD-10-CM | POA: Diagnosis not present

## 2016-01-17 DIAGNOSIS — M47812 Spondylosis without myelopathy or radiculopathy, cervical region: Secondary | ICD-10-CM | POA: Diagnosis not present

## 2016-01-17 DIAGNOSIS — M797 Fibromyalgia: Secondary | ICD-10-CM | POA: Diagnosis not present

## 2016-01-17 DIAGNOSIS — Z79891 Long term (current) use of opiate analgesic: Secondary | ICD-10-CM | POA: Diagnosis not present

## 2016-02-22 ENCOUNTER — Telehealth: Payer: Self-pay | Admitting: Family Medicine

## 2016-02-22 NOTE — Telephone Encounter (Signed)
Pt is getting both -- she is paying $90 per [facility]  For 1 month of Belsomra She also per narcotic reg - from [facility]  ----- got a #90 supply of Ambein.   i told them to remove any refills from the Belsomra -  Lorrin Mais will no longer be filled - was taken off med list  All further refills will have to be approved by Dr Laurance Flatten  Pt was aware at last visit that she would have to choose which she preferred we will discuss this with the pt.

## 2016-02-22 NOTE — Telephone Encounter (Signed)
Please clarify with patient. I was thinking that the insurance would not pay for the Belsomra about swine she was switched back to Ambien. Please clarify this. I actually prefer the Belsomra over the Ambien.

## 2016-02-22 NOTE — Telephone Encounter (Signed)
Just to clarify. Is she supposed to be on Belsomra or Ambien? Please advise and route to [facility]

## 2016-03-20 DIAGNOSIS — M47812 Spondylosis without myelopathy or radiculopathy, cervical region: Secondary | ICD-10-CM | POA: Diagnosis not present

## 2016-03-20 DIAGNOSIS — Z79891 Long term (current) use of opiate analgesic: Secondary | ICD-10-CM | POA: Diagnosis not present

## 2016-03-20 DIAGNOSIS — G894 Chronic pain syndrome: Secondary | ICD-10-CM | POA: Diagnosis not present

## 2016-03-20 DIAGNOSIS — M797 Fibromyalgia: Secondary | ICD-10-CM | POA: Diagnosis not present

## 2016-06-05 DIAGNOSIS — M47812 Spondylosis without myelopathy or radiculopathy, cervical region: Secondary | ICD-10-CM | POA: Diagnosis not present

## 2016-06-05 DIAGNOSIS — G894 Chronic pain syndrome: Secondary | ICD-10-CM | POA: Diagnosis not present

## 2016-06-05 DIAGNOSIS — M797 Fibromyalgia: Secondary | ICD-10-CM | POA: Diagnosis not present

## 2016-06-05 DIAGNOSIS — Z79891 Long term (current) use of opiate analgesic: Secondary | ICD-10-CM | POA: Diagnosis not present

## 2016-06-12 ENCOUNTER — Encounter: Payer: Self-pay | Admitting: Family Medicine

## 2016-06-12 ENCOUNTER — Ambulatory Visit (INDEPENDENT_AMBULATORY_CARE_PROVIDER_SITE_OTHER): Payer: BLUE CROSS/BLUE SHIELD | Admitting: Family Medicine

## 2016-06-12 VITALS — BP 134/88 | HR 90 | Temp 97.7°F | Ht 65.0 in | Wt 114.0 lb

## 2016-06-12 DIAGNOSIS — K219 Gastro-esophageal reflux disease without esophagitis: Secondary | ICD-10-CM | POA: Diagnosis not present

## 2016-06-12 DIAGNOSIS — L989 Disorder of the skin and subcutaneous tissue, unspecified: Secondary | ICD-10-CM

## 2016-06-12 DIAGNOSIS — E785 Hyperlipidemia, unspecified: Secondary | ICD-10-CM

## 2016-06-12 DIAGNOSIS — M797 Fibromyalgia: Secondary | ICD-10-CM | POA: Diagnosis not present

## 2016-06-12 DIAGNOSIS — E039 Hypothyroidism, unspecified: Secondary | ICD-10-CM

## 2016-06-12 DIAGNOSIS — R238 Other skin changes: Secondary | ICD-10-CM

## 2016-06-12 DIAGNOSIS — E559 Vitamin D deficiency, unspecified: Secondary | ICD-10-CM

## 2016-06-12 MED ORDER — PAREGORIC 2 MG/5ML PO TINC: ORAL | 1 refills | Status: DC

## 2016-06-12 MED ORDER — CARISOPRODOL 350 MG PO TABS: 350.0000 mg | ORAL_TABLET | Freq: Two times a day (BID) | ORAL | 1 refills | Status: DC | PRN

## 2016-06-12 MED ORDER — ALPRAZOLAM 0.5 MG PO TABS: 0.5000 mg | ORAL_TABLET | Freq: Every evening | ORAL | 1 refills | Status: DC | PRN

## 2016-06-12 MED ORDER — LEVOTHYROXINE SODIUM 50 MCG PO TABS: 50.0000 ug | ORAL_TABLET | Freq: Every day | ORAL | 3 refills | Status: DC

## 2016-06-12 NOTE — Progress Notes (Signed)
Subjective:    Patient ID: Wanda Robertson, female    DOB: Aug 16, 1956, 60 y.o.   MRN: [id number]  HPI Pt here for follow up and management of chronic medical problems which includes hypothyroid and hyperlipidemia. She is taking medications regularly.The patient today complains of some increased hoarseness and is concerned about a skin lesion on her back. She is requesting refills on several of her medicines. She is also due to get a mammogram and we will encourage her to get that and make sure that we get a copy of that as soon as she has had it done.     Patient Active Problem List   Diagnosis Date Noted  . 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 Family history of heart disease 02/25/2014  . Raynaud's disease 02/25/2014  . Hyperlipemia 05/15/2013  . GERD (gastroesophageal reflux disease) 05/15/2013  . Fibromyalgia 05/15/2013  . Chronic neck pain, with peripheral neuropathy 05/15/2013  . Osteoporosis 05/15/2013  . Hypothyroidism 05/15/2013  . Post menopausal syndrome 08/23/2011   Outpatient Encounter Prescriptions as of 06/12/2016  Medication Sig  . ALPRAZolam (XANAX) 0.5 MG tablet Take 1 tablet (0.5 mg total) by mouth at bedtime as needed.  Marland Kitchen aspirin EC 81 MG tablet Take 1 tablet (81 mg total) by mouth daily.  . Calcium Carbonate-Vitamin D (CALCIUM PLUS VITAMIN D) 300-100 MG-UNIT CAPS Take 4 tablets by mouth daily.  . carisoprodol (SOMA) 350 MG tablet Take 1 tablet (350 mg total) by mouth 2 (two) times daily as needed.  . Clidinium-Chlordiazepoxide (LIBRAX PO) Take by mouth as needed.    . clobetasol (TEMOVATE) 0.05 % GEL Apply to affected area BID PRN  . fish oil-omega-3 fatty acids 1000 MG capsule Take 2 g by mouth daily.  Marland Kitchen isometheptene-acetaminophen-dichloralphenazone (MIDRIN) 65-325-100 MG capsule Take 1 to 2 capsules once a day as needed for tension headaches  . levothyroxine (SYNTHROID) 50 MCG tablet Take 1 tablet (50 mcg total) by mouth daily.  Marland Kitchen morphine (KADIAN) 100 MG 24  hr capsule Take 100 mg by mouth 2 (two) times daily.   . Multiple Vitamins-Minerals (CENTRUM SILVER PO) Take 1 tablet by mouth daily.  . paregoric 2 MG/5ML solution Take 1 teaspoon by mouth every 8-12 hours as needed for severe diarrhea  . traMADol (ULTRAM) 50 MG tablet Take 50 mg by mouth 2 (two) times daily.  Marland Kitchen UNABLE TO FIND Med Name: NitroBID cream - from rheumotology - topical for hands  . [DISCONTINUED] ALPRAZolam (XANAX) 0.5 MG tablet Take 1 tablet (0.5 mg total) by mouth at bedtime as needed.  . [DISCONTINUED] carisoprodol (SOMA) 350 MG tablet Take 1 tablet (350 mg total) by mouth 2 (two) times daily as needed.  . [DISCONTINUED] levothyroxine (SYNTHROID) 50 MCG tablet Take 1 tablet (50 mcg total) by mouth daily.  . [DISCONTINUED] paregoric 2 MG/5ML solution Take 1 teaspoon by mouth every 8-12 hours as needed for severe diarrhea   No facility-administered encounter medications on file as of 06/12/2016.       Review of Systems  Constitutional: Negative.   HENT: Positive for voice change (hoarse).   Eyes: Negative.   Respiratory: Negative.   Cardiovascular: Negative.   Gastrointestinal: Negative.   Endocrine: Negative.   Genitourinary: Negative.   Musculoskeletal: Negative.   Skin: Negative.        Back - skin lesion (itching)  Allergic/Immunologic: Negative.   Neurological: Negative.   Hematological: Negative.   Psychiatric/Behavioral: Negative.        Objective:   Physical  Exam  Constitutional: She is oriented to person, place, and time. She appears well-developed and well-nourished.  HENT:  Head: Normocephalic and atraumatic.  Right Ear: External ear normal.  Left Ear: External ear normal.  Nose: Nose normal.  Mouth/Throat: No oropharyngeal exudate.  Eyes: Conjunctivae and EOM are normal. Pupils are equal, round, and reactive to light. Right eye exhibits no discharge. Left eye exhibits no discharge. No scleral icterus.  Neck: Normal range of motion. Neck supple. No  thyromegaly present.  No bruits or thyromegaly  Cardiovascular: Normal rate, regular rhythm, normal heart sounds and intact distal pulses.   No murmur heard. The heart has a regular rate and rhythm at 84/m  Pulmonary/Chest: Effort normal and breath sounds normal. No respiratory distress. She has no wheezes. She has no rales.  Clear anteriorly and posteriorly  Abdominal: Soft. Bowel sounds are normal. She exhibits no mass. There is no tenderness. There is no rebound and no guarding.  No liver or spleen enlargement and no epigastric tenderness and no suprapubic tenderness and no masses.  Musculoskeletal: Normal range of motion. She exhibits tenderness. She exhibits no edema.  Some slight tenderness in the supraclavicular suprascapular area on both sides of the neck.  Lymphadenopathy:    She has no cervical adenopathy.  Neurological: She is alert and oriented to person, place, and time. She has normal reflexes. No cranial nerve deficit.  Skin: Skin is warm and dry. No rash noted.  There were 2 tiny areas of pruritus and irritation beneath the bra strap on the patient's left side and cryotherapy was performed on these 2 lesions.  Psychiatric: She has a normal mood and affect. Her behavior is normal. Judgment and thought content normal.  Nursing note and vitals reviewed.   BP 134/88 (BP Location: Left Arm)   Pulse 90   Temp 97.7 F (36.5 C) (Oral)   Ht '5\' 5"'  (1.651 m)   Wt 114 lb (51.7 kg)   BMI 18.97 kg/m        Assessment & Plan:  1. Hyperlipemia -Continue aggressive therapeutic lifestyle changes especially in light of the strong family history for heart disease - BMP8+EGFR - CBC with Differential/Platelet - Hepatic function panel - Lipid panel  2. Hypothyroidism, unspecified hypothyroidism type -Continue current treatment pending results of lab work - CBC with Differential/Platelet - Thyroid Panel With TSH  3. Vitamin D deficiency -Continue current treatment along with  calcium for bones - CBC with Differential/Platelet - VITAMIN D 25 Hydroxy (Vit-D Deficiency, Fractures)  4. Gastroesophageal reflux disease, esophagitis presence not specified -No complaints with reflux today - CBC with Differential/Platelet  5. Fibromyalgia -Continue with follow-up with pain management - CBC with Differential/Platelet  6. Skin irritation -CryoTherapy of 2 lesions that were irritated on the back.  Meds ordered this encounter  Medications  . paregoric 2 MG/5ML solution    Sig: Take 1 teaspoon by mouth every 8-12 hours as needed for severe diarrhea    Dispense:  240 mL    Refill:  1  . levothyroxine (SYNTHROID) 50 MCG tablet    Sig: Take 1 tablet (50 mcg total) by mouth daily.    Dispense:  90 tablet    Refill:  3  . carisoprodol (SOMA) 350 MG tablet    Sig: Take 1 tablet (350 mg total) by mouth 2 (two) times daily as needed.    Dispense:  180 tablet    Refill:  1  . ALPRAZolam (XANAX) 0.5 MG tablet    Sig: Take  1 tablet (0.5 mg total) by mouth at bedtime as needed.    Dispense:  90 tablet    Refill:  1   Patient Instructions  Continue current medications. Continue good therapeutic lifestyle changes which include good diet and exercise. Fall precautions discussed with patient. If an FOBT was given today- please return it to our front desk. If you are over 20 years old - you may need Prevnar 41 or the adult Pneumonia vaccine.   After your visit with [place] today you will receive a survey in the mail or online from [employer] regarding your care with [place]. Please take a moment to fill this out. Your feedback is very important to [place] as you can help [place] better understand your patient needs as well as improve your experience and satisfaction. WE CARE ABOUT YOU!!!    Follow-up with cardiology as discussed Continue to follow-up aggressive therapeutic lifestyle changes because of strong family history for heart disease Continue with thyroid medicine pending results of  lab work Continue with omega-3 fatty acids  Continue follow-up with pain management    Arrie Senate MD

## 2016-06-12 NOTE — Patient Instructions (Addendum)
Continue current medications. Continue good therapeutic lifestyle changes which include good diet and exercise. Fall precautions discussed with patient. If an FOBT was given today- please return it to our front desk. If you are over 60 years old - you may need Prevnar 106 or the adult Pneumonia vaccine.   After your visit with [place] today you will receive a survey in the mail or online from [employer] regarding your care with [place]. Please take a moment to fill this out. Your feedback is very important to [place] as you can help [place] better understand your patient needs as well as improve your experience and satisfaction. WE CARE ABOUT YOU!!!    Follow-up with cardiology as discussed Continue to follow-up aggressive therapeutic lifestyle changes because of strong family history for heart disease Continue with thyroid medicine pending results of lab work Continue with omega-3 fatty acids  Continue follow-up with pain management

## 2016-06-13 LAB — HEPATIC FUNCTION PANEL
ALK PHOS: 47 IU/L (ref 39–117)
ALT: 16 IU/L (ref 0–32)
AST: 21 IU/L (ref 0–40)
Albumin: 4.5 g/dL (ref 3.5–5.5)
BILIRUBIN, DIRECT: 0.09 mg/dL (ref 0.00–0.40)
Bilirubin Total: 0.3 mg/dL (ref 0.0–1.2)
Total Protein: 7.1 g/dL (ref 6.0–8.5)

## 2016-06-13 LAB — BMP8+EGFR
BUN/Creatinine Ratio: 14 (ref 9–23)
BUN: 9 mg/dL (ref 6–24)
CALCIUM: 9.3 mg/dL (ref 8.7–10.2)
CO2: 27 mmol/L (ref 18–29)
Chloride: 100 mmol/L (ref 96–106)
Creatinine, Ser: 0.66 mg/dL (ref 0.57–1.00)
GFR, EST AFRICAN AMERICAN: 112 mL/min/{1.73_m2} (ref >59)
GFR, EST NON AFRICAN AMERICAN: 97 mL/min/{1.73_m2} (ref >59)
Glucose: 81 mg/dL (ref 65–99)
POTASSIUM: 4.7 mmol/L (ref 3.5–5.2)
SODIUM: 140 mmol/L (ref 134–144)

## 2016-06-13 LAB — CBC WITH DIFFERENTIAL/PLATELET
BASOS: 0 %
Basophils Absolute: 0 10*3/uL (ref 0.0–0.2)
EOS (ABSOLUTE): 0 10*3/uL (ref 0.0–0.4)
EOS: 1 %
HEMATOCRIT: 39.9 % (ref 34.0–46.6)
Hemoglobin: 13.2 g/dL (ref 11.1–15.9)
IMMATURE GRANS (ABS): 0 10*3/uL (ref 0.0–0.1)
Immature Granulocytes: 0 %
LYMPHS: 29 %
Lymphocytes Absolute: 1.7 10*3/uL (ref 0.7–3.1)
MCH: 32 pg (ref 26.6–33.0)
MCHC: 33.1 g/dL (ref 31.5–35.7)
MCV: 97 fL (ref 79–97)
Monocytes Absolute: 0.5 10*3/uL (ref 0.1–0.9)
Monocytes: 8 %
NEUTROS ABS: 3.7 10*3/uL (ref 1.4–7.0)
Neutrophils: 62 %
PLATELETS: 256 10*3/uL (ref 150–379)
RBC: 4.13 x10E6/uL (ref 3.77–5.28)
RDW: 13.4 % (ref 12.3–15.4)
WBC: 6 10*3/uL (ref 3.4–10.8)

## 2016-06-13 LAB — LIPID PANEL
CHOLESTEROL TOTAL: 169 mg/dL (ref 100–199)
Chol/HDL Ratio: 2.9 ratio units (ref 0.0–4.4)
HDL: 59 mg/dL (ref >39)
LDL Calculated: 95 mg/dL (ref 0–99)
Triglycerides: 73 mg/dL (ref 0–149)
VLDL Cholesterol Cal: 15 mg/dL (ref 5–40)

## 2016-06-13 LAB — THYROID PANEL WITH TSH
FREE THYROXINE INDEX: 1.7 (ref 1.2–4.9)
T3 UPTAKE RATIO: 24 % (ref 24–39)
T4 TOTAL: 7 ug/dL (ref 4.5–12.0)
TSH: 1.28 u[IU]/mL (ref 0.450–4.500)

## 2016-06-13 LAB — VITAMIN D 25 HYDROXY (VIT D DEFICIENCY, FRACTURES): VIT D 25 HYDROXY: 49.2 ng/mL (ref 30.0–100.0)

## 2016-07-24 ENCOUNTER — Encounter: Payer: Self-pay | Admitting: *Deleted

## 2016-08-07 DIAGNOSIS — G894 Chronic pain syndrome: Secondary | ICD-10-CM | POA: Diagnosis not present

## 2016-08-07 DIAGNOSIS — Z79891 Long term (current) use of opiate analgesic: Secondary | ICD-10-CM | POA: Diagnosis not present

## 2016-08-07 DIAGNOSIS — M47812 Spondylosis without myelopathy or radiculopathy, cervical region: Secondary | ICD-10-CM | POA: Diagnosis not present

## 2016-08-07 DIAGNOSIS — M797 Fibromyalgia: Secondary | ICD-10-CM | POA: Diagnosis not present

## 2016-08-31 ENCOUNTER — Encounter: Payer: Self-pay | Admitting: Podiatry

## 2016-08-31 ENCOUNTER — Ambulatory Visit (INDEPENDENT_AMBULATORY_CARE_PROVIDER_SITE_OTHER): Payer: BLUE CROSS/BLUE SHIELD

## 2016-08-31 ENCOUNTER — Ambulatory Visit (INDEPENDENT_AMBULATORY_CARE_PROVIDER_SITE_OTHER): Payer: BLUE CROSS/BLUE SHIELD | Admitting: Podiatry

## 2016-08-31 VITALS — BP 122/77 | HR 97 | Resp 16

## 2016-08-31 DIAGNOSIS — L603 Nail dystrophy: Secondary | ICD-10-CM

## 2016-08-31 DIAGNOSIS — L84 Corns and callosities: Secondary | ICD-10-CM | POA: Diagnosis not present

## 2016-08-31 DIAGNOSIS — L600 Ingrowing nail: Secondary | ICD-10-CM | POA: Diagnosis not present

## 2016-08-31 DIAGNOSIS — M779 Enthesopathy, unspecified: Secondary | ICD-10-CM

## 2016-08-31 MED ORDER — TERBINAFINE HCL 250 MG PO TABS: ORAL_TABLET | ORAL | 0 refills | Status: DC

## 2016-08-31 NOTE — Progress Notes (Signed)
   Subjective:    Patient ID: Wanda Robertson, female    DOB: 1956-09-10, 60 y.o.   MRN: [id number]  HPI Chief Complaint  Patient presents with  . Nail Problem    Hallux nails bilateral - thick, discolored nails for several years, left great nail sore at medial border, Dr. Paulla Dolly did laser in 2014      Review of Systems  All other systems reviewed and are negative.      Objective:   Physical Exam        Assessment & Plan:

## 2016-09-01 NOTE — Progress Notes (Signed)
Subjective:     Patient ID: Wanda Robertson, female   DOB: 1955-12-27, 60 y.o.   MRN: [id number]  HPI patient presents with nail disease hallux bilateral with yellow discoloration distal one third of the bed localized in nature with incurvation of the medial border left hallux that at times gets tender   Review of Systems  All other systems reviewed and are negative.      Objective:   Physical Exam  Constitutional: She is oriented to person, place, and time.  Cardiovascular: Intact distal pulses.   Musculoskeletal: Normal range of motion.  Neurological: She is oriented to person, place, and time.  Skin: Skin is warm.  Nursing note and vitals reviewed.  neurovascular status intact with yellow discoloration distal one half of the nailbeds with incurvation medial border left hallux and slight changes of the nails overall     Assessment:     Mycotic infection nailbeds    Plan:     Advised on Lamisil pulse therapy and utilization of laser and patient is scheduled for laser to current time

## 2016-09-11 ENCOUNTER — Ambulatory Visit: Payer: BLUE CROSS/BLUE SHIELD

## 2016-09-11 DIAGNOSIS — L603 Nail dystrophy: Secondary | ICD-10-CM

## 2016-09-11 DIAGNOSIS — B351 Tinea unguium: Secondary | ICD-10-CM

## 2016-09-12 NOTE — Progress Notes (Signed)
Pt presents with mycotic infection of nails  All other systems are negative  Laser therapy administered to affected nails and tolerated well. All safety precautions were in place 

## 2016-10-12 DIAGNOSIS — M47812 Spondylosis without myelopathy or radiculopathy, cervical region: Secondary | ICD-10-CM | POA: Diagnosis not present

## 2016-10-12 DIAGNOSIS — G894 Chronic pain syndrome: Secondary | ICD-10-CM | POA: Diagnosis not present

## 2016-10-12 DIAGNOSIS — Z79891 Long term (current) use of opiate analgesic: Secondary | ICD-10-CM | POA: Diagnosis not present

## 2016-10-12 DIAGNOSIS — M797 Fibromyalgia: Secondary | ICD-10-CM | POA: Diagnosis not present

## 2016-10-27 NOTE — Progress Notes (Signed)
This encounter was created in error - please disregard.

## 2016-10-30 ENCOUNTER — Ambulatory Visit: Payer: BLUE CROSS/BLUE SHIELD

## 2016-10-30 DIAGNOSIS — B351 Tinea unguium: Secondary | ICD-10-CM

## 2016-10-30 DIAGNOSIS — L603 Nail dystrophy: Secondary | ICD-10-CM

## 2016-10-30 MED ORDER — TERBINAFINE HCL 250 MG PO TABS: 250.0000 mg | ORAL_TABLET | Freq: Every day | ORAL | 0 refills | Status: DC

## 2016-10-30 NOTE — Progress Notes (Signed)
Pt presents with mycotic infection of nails. Pt concerned with rash developing on her feet. She has completed 2 rounds of the lamisil pulse pack  All other systems are negative  Laser therapy administered to affected nails and tolerated well. All safety precautions were in place. After consultation with Dr Paulla Dolly and review of patient's labs, order for lamisil 250mg  x 45 days was written. Pt has 14 pills left from original Rx, remainder of the medication was called into pharmacy. She is to f/u in 4 weeks for 3 of 4 treatments

## 2016-11-30 ENCOUNTER — Ambulatory Visit (INDEPENDENT_AMBULATORY_CARE_PROVIDER_SITE_OTHER): Payer: Self-pay

## 2016-11-30 DIAGNOSIS — L603 Nail dystrophy: Secondary | ICD-10-CM

## 2016-11-30 DIAGNOSIS — B351 Tinea unguium: Secondary | ICD-10-CM

## 2016-11-30 MED ORDER — NONFORMULARY OR COMPOUNDED ITEM: 1.0000 g | Freq: Every day | 11 refills | Status: DC

## 2016-11-30 NOTE — Progress Notes (Signed)
Pt presents with mycotic infection of nails  All other systems are negative  Laser therapy administered to affected nails and tolerated well. All safety precautions were in place. Rx for [employer] nail laquer faxed over. Re-appointed in 1 month for 4th treatment

## 2016-12-11 ENCOUNTER — Ambulatory Visit: Payer: BLUE CROSS/BLUE SHIELD | Admitting: Family Medicine

## 2016-12-11 DIAGNOSIS — G894 Chronic pain syndrome: Secondary | ICD-10-CM | POA: Diagnosis not present

## 2016-12-11 DIAGNOSIS — M47812 Spondylosis without myelopathy or radiculopathy, cervical region: Secondary | ICD-10-CM | POA: Diagnosis not present

## 2016-12-11 DIAGNOSIS — M797 Fibromyalgia: Secondary | ICD-10-CM | POA: Diagnosis not present

## 2016-12-11 DIAGNOSIS — Z79891 Long term (current) use of opiate analgesic: Secondary | ICD-10-CM | POA: Diagnosis not present

## 2016-12-12 ENCOUNTER — Telehealth: Payer: Self-pay | Admitting: Family Medicine

## 2016-12-12 ENCOUNTER — Ambulatory Visit: Payer: Self-pay | Admitting: Family Medicine

## 2016-12-12 DIAGNOSIS — J019 Acute sinusitis, unspecified: Secondary | ICD-10-CM | POA: Diagnosis not present

## 2016-12-12 DIAGNOSIS — J029 Acute pharyngitis, unspecified: Secondary | ICD-10-CM | POA: Diagnosis not present

## 2016-12-12 NOTE — Telephone Encounter (Signed)
I spoke with Wanda Robertson and she stated that even though patient is a [facility] patient she is not a [facility] patient, therefore she would need to go to her PCP or Establish with [facility] first. I left patient a voicemail 2/27 at 9:05am advising that  [facility] could not see her today for her acute at 10:30am. Awaiting patient's response via phone call.

## 2016-12-12 NOTE — Telephone Encounter (Signed)
I attempted to call patient again at 10:20am to advise that she needs to go to Dr. Laurance Flatten and can not come to Dr. Lucita Lora office to be seen today for her acute visit due to not being a [facility] patient. I left a voicemail on patient's phone.

## 2016-12-21 DIAGNOSIS — Z01419 Encounter for gynecological examination (general) (routine) without abnormal findings: Secondary | ICD-10-CM | POA: Diagnosis not present

## 2016-12-21 DIAGNOSIS — Z13820 Encounter for screening for osteoporosis: Secondary | ICD-10-CM | POA: Diagnosis not present

## 2016-12-21 DIAGNOSIS — Z681 Body mass index (BMI) 19 or less, adult: Secondary | ICD-10-CM | POA: Diagnosis not present

## 2016-12-22 ENCOUNTER — Ambulatory Visit (INDEPENDENT_AMBULATORY_CARE_PROVIDER_SITE_OTHER): Payer: BLUE CROSS/BLUE SHIELD | Admitting: Family Medicine

## 2016-12-22 ENCOUNTER — Encounter: Payer: Self-pay | Admitting: Family Medicine

## 2016-12-22 VITALS — BP 112/76 | HR 101 | Temp 97.3°F | Ht 65.0 in | Wt 113.0 lb

## 2016-12-22 DIAGNOSIS — F5101 Primary insomnia: Secondary | ICD-10-CM | POA: Diagnosis not present

## 2016-12-22 DIAGNOSIS — M542 Cervicalgia: Secondary | ICD-10-CM | POA: Diagnosis not present

## 2016-12-22 DIAGNOSIS — E559 Vitamin D deficiency, unspecified: Secondary | ICD-10-CM | POA: Diagnosis not present

## 2016-12-22 DIAGNOSIS — E7800 Pure hypercholesterolemia, unspecified: Secondary | ICD-10-CM | POA: Diagnosis not present

## 2016-12-22 DIAGNOSIS — K219 Gastro-esophageal reflux disease without esophagitis: Secondary | ICD-10-CM

## 2016-12-22 DIAGNOSIS — M797 Fibromyalgia: Secondary | ICD-10-CM

## 2016-12-22 MED ORDER — SUVOREXANT 20 MG PO TABS: 1.0000 | ORAL_TABLET | Freq: Every evening | ORAL | 5 refills | Status: DC | PRN

## 2016-12-22 NOTE — Progress Notes (Signed)
Subjective:    Patient ID: Wanda Robertson, female    DOB: 1956/01/06, 62 y.o.   MRN: [id number]  HPI  Pt here for follow up and management of chronic medical problems which includes hyperlipidemia. She is taking medication regularly.The patient is doing well overall. She's been bouncing between using Ambien and Bell summer. The bell summer works but her [employer] will not pay for this. She is due to get her pelvic exam mamm[facility] and this was actually done on March 8. She will be given an FOBT to return. She will get lab work today a traditional lipid panel. She had her colonoscopy in February 2009 and will be due another one in February of next year. She denies any other problems and sleep issues. The patient continues to see Dr. Nicholaus Bloom at the pain management Center in [place] for her chronic neck and back pain. She denies any chest pain or shortness of breath. She does have a family history of heart disease. She will call back her cardiologist to see if it when he can see her at some point in the future and she was supposed to see him about every 2 years. She denies any trouble with her stomach including nausea vomiting diarrhea blood in the stool or black tarry bowel movements. She is passing her water without problems. She plans to get her mammogram'@no'  clot in [place] and we'll make sure that we get a copy of that report.      Patient Active Problem List   Diagnosis Date Noted  . 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 Family history of heart disease 02/25/2014  . Raynaud's disease 02/25/2014  . Hyperlipemia 05/15/2013  . GERD (gastroesophageal reflux disease) 05/15/2013  . Fibromyalgia 05/15/2013  . Chronic neck pain, with peripheral neuropathy 05/15/2013  . Osteoporosis 05/15/2013  . Hypothyroidism 05/15/2013  . Post menopausal syndrome 08/23/2011   Outpatient Encounter Prescriptions as of 12/22/2016  Medication Sig  .  ALPRAZolam (XANAX) 0.5 MG tablet Take 1 tablet (0.5 mg total) by mouth at bedtime as needed.  Marland Kitchen aspirin EC 81 MG tablet Take 1 tablet (81 mg total) by mouth daily.  . Biotin 1000 MCG tablet Take 1,000 mcg by mouth 3 (three) times daily.  . Calcium Carbonate-Vitamin D (CALCIUM PLUS VITAMIN D) 300-100 MG-UNIT CAPS Take 4 tablets by mouth daily.  . carisoprodol (SOMA) 350 MG tablet Take 1 tablet (350 mg total) by mouth 2 (two) times daily as needed.  . Clidinium-Chlordiazepoxide (LIBRAX PO) Take by mouth as needed.    . clobetasol (TEMOVATE) 0.05 % GEL Apply to affected area BID PRN  . Coenzyme Q10 (CO Q 10 PO) Take by mouth.  . Cyanocobalamin (VITAMIN B 12 PO) Take by mouth.  . fish oil-omega-3 fatty acids 1000 MG capsule Take 2 g by mouth daily.  . Flaxseed, Linseed, (FLAX SEED OIL PO) Take by mouth.  . isometheptene-acetaminophen-dichloralphenazone (MIDRIN) 65-325-100 MG capsule Take 1 to 2 capsules once a day as needed for tension headaches  . KRILL OIL PO Take by mouth.  . levothyroxine (SYNTHROID) 50 MCG tablet Take 1 tablet (50 mcg total) by mouth daily.  Marland Kitchen morphine (KADIAN) 100 MG 24 hr capsule Take 100 mg by mouth 2 (two) times daily.   . Multiple Vitamins-Minerals (CENTRUM SILVER PO) Take 1 tablet by mouth daily.  . NONFORMULARY OR COMPOUNDED ITEM Apply 1-2 g topically daily. [employer] Nail lacquer: Fluconazole 2%, Terbinafine 1%, DMSO  .  paregoric 2 MG/5ML solution Take 1 teaspoon by mouth every 8-12 hours as needed for severe diarrhea  . terbinafine (LAMISIL) 250 MG tablet Take 1 tablet (250 mg total) by mouth daily.  . traMADol (ULTRAM) 50 MG tablet Take 50 mg by mouth 2 (two) times daily.  Marland Kitchen UNABLE TO FIND Med Name: NitroBID cream - from rheumotology - topical for hands  . zolpidem (AMBIEN CR) 12.5 MG CR tablet Take 12.5 mg by mouth at bedtime as needed for sleep.  . [DISCONTINUED] terbinafine (LAMISIL) 250 MG tablet Please take one a day x 7days, repeat every 4 weeks x 4 months    No facility-administered encounter medications on file as of 12/22/2016.       Review of Systems  Constitutional: Negative.   HENT: Negative.   Eyes: Negative.   Respiratory: Negative.   Cardiovascular: Negative.   Gastrointestinal: Negative.   Endocrine: Negative.   Genitourinary: Negative.   Musculoskeletal: Negative.   Skin: Negative.   Allergic/Immunologic: Negative.   Neurological: Negative.   Hematological: Negative.   Psychiatric/Behavioral: Negative.        Issues with sleep       Objective:   Physical Exam  Constitutional: She is oriented to person, place, and time. She appears well-developed and well-nourished. No distress.  Pleasant and alert  HENT:  Head: Normocephalic and atraumatic.  Right Ear: External ear normal.  Left Ear: External ear normal.  Nose: Nose normal.  Mouth/Throat: Oropharynx is clear and moist. No oropharyngeal exudate.  Eyes: Conjunctivae and EOM are normal. Pupils are equal, round, and reactive to light. Right eye exhibits no discharge. Left eye exhibits no discharge. No scleral icterus.  Neck: Normal range of motion. Neck supple. No thyromegaly present.  Thyromegaly or bruits not present  Cardiovascular: Normal rate, regular rhythm and intact distal pulses.   No murmur heard. The heart is regular at 84/m  Pulmonary/Chest: Effort normal and breath sounds normal. No respiratory distress. She has no wheezes. She has no rales.  Clear anteriorly and posteriorly  Abdominal: Soft. Bowel sounds are normal. She exhibits no mass. There is no tenderness. There is no rebound and no guarding.  No abdominal tenderness masses or organ enlargement  Musculoskeletal: Normal range of motion. She exhibits no edema.  Some nuchal rigidity and stiffness  Lymphadenopathy:    She has no cervical adenopathy.  Neurological: She is alert and oriented to person, place, and time. She has normal reflexes. No cranial nerve deficit.  Skin: Skin is warm and dry. No  rash noted.  Psychiatric: She has a normal mood and affect. Her behavior is normal. Judgment and thought content normal.  Nursing note and vitals reviewed.  BP 112/76 (BP Location: Left Arm)   Pulse (!) 101   Temp 97.3 F (36.3 C) (Oral)   Ht '5\' 5"'  (1.651 m)   Wt 113 lb (51.3 kg)   BMI 18.80 kg/m         Assessment & Plan:  1. Pure hypercholesterolemia -Tinny fish oil and aggressive therapeutic lifestyle changes - CBC with Differential/Platelet - BMP8+EGFR - Lipid panel  2. Vitamin D deficiency -Continue vitamin D replacement pending results of lab work - CBC with Differential/Platelet - VITAMIN D 25 Hydroxy (Vit-D Deficiency, Fractures)  3. Gastroesophageal reflux disease, esophagitis presence not specified -The patient has no complaints with this today. -She is due to have her colonoscopy early next year. - CBC with Differential/Platelet - BMP8+EGFR - Hepatic function panel  4. Fibromyalgia -Continue current treatments as  recommended by pain management - CBC with Differential/Platelet  5. Cervicalgia -Continue current treatments as recommended by pain management along with muscle relaxants  6. Primary insomnia -We have discouraged the use of Ambien and we will try to do an override for her insurance to get Belsomra  Meds ordered this encounter  Medications  . zolpidem (AMBIEN CR) 12.5 MG CR tablet    Sig: Take 12.5 mg by mouth at bedtime as needed for sleep.  . Coenzyme Q10 (CO Q 10 PO)    Sig: Take by mouth.  Marland Kitchen KRILL OIL PO    Sig: Take by mouth.  . Flaxseed, Linseed, (FLAX SEED OIL PO)    Sig: Take by mouth.  . Cyanocobalamin (VITAMIN B 12 PO)    Sig: Take by mouth.  . Biotin 1000 MCG tablet    Sig: Take 1,000 mcg by mouth 3 (three) times daily.  . Suvorexant (BELSOMRA) 20 MG TABS    Sig: Take 1 tablet by mouth at bedtime as needed.    Dispense:  30 tablet    Refill:  5   Patient Instructions  Continue current medications. Continue good  therapeutic lifestyle changes which include good diet and exercise. Fall precautions discussed with patient. If an FOBT was given today- please return it to our front desk. If you are over 1 years old - you may need Prevnar 64 or the adult Pneumonia vaccine.  **Flu shots are available--- please call and schedule a FLU-CLINIC appointment**  After your visit with [place] today you will receive a survey in the mail or online from [employer] regarding your care with [place]. Please take a moment to fill this out. Your feedback is very important to [place] as you can help [place] better understand your patient needs as well as improve your experience and satisfaction. WE CARE ABOUT YOU!!!   Consider trying the MG 12 as I discussed with you twice daily Call the cardiologist and see if he can get an appointment with him at some time in the future, you can tell them that there is no rush for this appointment to happen Continue to follow-up with the pain clinic We will try to do our best to get an override for you to take Bell summer instead of Ambien We will give you a coupon for this today We will call with lab work results as soon as these results become available  Arrie Senate MD

## 2016-12-22 NOTE — Patient Instructions (Addendum)
Continue current medications. Continue good therapeutic lifestyle changes which include good diet and exercise. Fall precautions discussed with patient. If an FOBT was given today- please return it to our front desk. If you are over 61 years old - you may need Prevnar 47 or the adult Pneumonia vaccine.  **Flu shots are available--- please call and schedule a FLU-CLINIC appointment**  After your visit with [place] today you will receive a survey in the mail or online from [employer] regarding your care with [place]. Please take a moment to fill this out. Your feedback is very important to [place] as you can help [place] better understand your patient needs as well as improve your experience and satisfaction. WE CARE ABOUT YOU!!!   Consider trying the MG 12 as I discussed with you twice daily Call the cardiologist and see if he can get an appointment with him at some time in the future, you can tell them that there is no rush for this appointment to happen Continue to follow-up with the pain clinic We will try to do our best to get an override for you to take Bell summer instead of Ambien We will give you a coupon for this today We will call with lab work results as soon as these results become available

## 2016-12-23 LAB — CBC WITH DIFFERENTIAL/PLATELET
BASOS: 1 %
Basophils Absolute: 0 10*3/uL (ref 0.0–0.2)
EOS (ABSOLUTE): 0.1 10*3/uL (ref 0.0–0.4)
EOS: 1 %
HEMATOCRIT: 36.9 % (ref 34.0–46.6)
HEMOGLOBIN: 12.5 g/dL (ref 11.1–15.9)
Immature Grans (Abs): 0 10*3/uL (ref 0.0–0.1)
Immature Granulocytes: 0 %
LYMPHS ABS: 1.6 10*3/uL (ref 0.7–3.1)
Lymphs: 32 %
MCH: 32 pg (ref 26.6–33.0)
MCHC: 33.9 g/dL (ref 31.5–35.7)
MCV: 94 fL (ref 79–97)
MONOCYTES: 9 %
MONOS ABS: 0.4 10*3/uL (ref 0.1–0.9)
NEUTROS ABS: 2.7 10*3/uL (ref 1.4–7.0)
Neutrophils: 57 %
Platelets: 293 10*3/uL (ref 150–379)
RBC: 3.91 x10E6/uL (ref 3.77–5.28)
RDW: 13.5 % (ref 12.3–15.4)
WBC: 4.8 10*3/uL (ref 3.4–10.8)

## 2016-12-23 LAB — HEPATIC FUNCTION PANEL
ALK PHOS: 42 IU/L (ref 39–117)
ALT: 16 IU/L (ref 0–32)
AST: 22 IU/L (ref 0–40)
Albumin: 4.2 g/dL (ref 3.6–4.8)
BILIRUBIN TOTAL: 0.3 mg/dL (ref 0.0–1.2)
BILIRUBIN, DIRECT: 0.09 mg/dL (ref 0.00–0.40)
Total Protein: 6.7 g/dL (ref 6.0–8.5)

## 2016-12-23 LAB — BMP8+EGFR
BUN/Creatinine Ratio: 14 (ref 12–28)
BUN: 10 mg/dL (ref 8–27)
CO2: 26 mmol/L (ref 18–29)
CREATININE: 0.69 mg/dL (ref 0.57–1.00)
Calcium: 8.8 mg/dL (ref 8.7–10.3)
Chloride: 99 mmol/L (ref 96–106)
GFR, EST AFRICAN AMERICAN: 109 mL/min/{1.73_m2} (ref >59)
GFR, EST NON AFRICAN AMERICAN: 95 mL/min/{1.73_m2} (ref >59)
Glucose: 64 mg/dL — ABNORMAL LOW (ref 65–99)
Potassium: 4.7 mmol/L (ref 3.5–5.2)
SODIUM: 138 mmol/L (ref 134–144)

## 2016-12-23 LAB — LIPID PANEL
CHOL/HDL RATIO: 2.8 ratio (ref 0.0–4.4)
Cholesterol, Total: 165 mg/dL (ref 100–199)
HDL: 59 mg/dL (ref >39)
LDL CALC: 98 mg/dL (ref 0–99)
Triglycerides: 38 mg/dL (ref 0–149)
VLDL CHOLESTEROL CAL: 8 mg/dL (ref 5–40)

## 2016-12-23 LAB — VITAMIN D 25 HYDROXY (VIT D DEFICIENCY, FRACTURES): VIT D 25 HYDROXY: 82.4 ng/mL (ref 30.0–100.0)

## 2017-01-01 ENCOUNTER — Ambulatory Visit (INDEPENDENT_AMBULATORY_CARE_PROVIDER_SITE_OTHER): Payer: BLUE CROSS/BLUE SHIELD | Admitting: Podiatry

## 2017-01-01 DIAGNOSIS — L603 Nail dystrophy: Secondary | ICD-10-CM

## 2017-01-01 DIAGNOSIS — L84 Corns and callosities: Secondary | ICD-10-CM | POA: Diagnosis not present

## 2017-01-02 ENCOUNTER — Telehealth: Payer: Self-pay

## 2017-01-02 NOTE — Telephone Encounter (Signed)
Please make sure that we have tried 2 of these other hypnotics and they have not worked and then we can go back and try the bell summer again.

## 2017-01-03 NOTE — Progress Notes (Signed)
Subjective:     Patient ID: Wanda Robertson, female   DOB: May 11, 1956, 61 y.o.   MRN: [id number]  HPI patient presents stating that she is doing pretty good but does have some concerns around the toes   Review of Systems     Objective:   Physical Exam Neurovascular status intact with irritation around the hallux with crusted tissue right over left with improving nail secondary to laser    Assessment:     Probable lesion secondary more to digital structure versus fungus    Plan:     Educated her on this and the ski slope appearance of her right over left hallux probably creating irritation. We will continue with the conservative treatment and will probably debride the area Visit after she finishes her medication

## 2017-01-04 MED ORDER — ZOLPIDEM TARTRATE ER 12.5 MG PO TBCR: 12.5000 mg | EXTENDED_RELEASE_TABLET | Freq: Every evening | ORAL | 0 refills | Status: DC | PRN

## 2017-01-04 MED ORDER — ISOMETHEPTENE-DICHLORAL-APAP 65-100-325 MG PO CAPS: 1.0000 | ORAL_CAPSULE | Freq: Four times a day (QID) | ORAL | 1 refills | Status: DC | PRN

## 2017-01-04 NOTE — Addendum Note (Signed)
Addended by: Zannie Cove on: 01/04/2017 04:39 PM   Modules accepted: Orders

## 2017-01-04 NOTE — Telephone Encounter (Signed)
Pt has only tried the [employer] and it does work - we will cont with that med.

## 2017-02-01 ENCOUNTER — Ambulatory Visit: Payer: BLUE CROSS/BLUE SHIELD

## 2017-02-05 DIAGNOSIS — M47812 Spondylosis without myelopathy or radiculopathy, cervical region: Secondary | ICD-10-CM | POA: Diagnosis not present

## 2017-02-05 DIAGNOSIS — M797 Fibromyalgia: Secondary | ICD-10-CM | POA: Diagnosis not present

## 2017-02-05 DIAGNOSIS — Z79891 Long term (current) use of opiate analgesic: Secondary | ICD-10-CM | POA: Diagnosis not present

## 2017-02-05 DIAGNOSIS — G894 Chronic pain syndrome: Secondary | ICD-10-CM | POA: Diagnosis not present

## 2017-04-23 DIAGNOSIS — M47812 Spondylosis without myelopathy or radiculopathy, cervical region: Secondary | ICD-10-CM | POA: Diagnosis not present

## 2017-04-23 DIAGNOSIS — M797 Fibromyalgia: Secondary | ICD-10-CM | POA: Diagnosis not present

## 2017-04-23 DIAGNOSIS — Z79891 Long term (current) use of opiate analgesic: Secondary | ICD-10-CM | POA: Diagnosis not present

## 2017-04-23 DIAGNOSIS — G894 Chronic pain syndrome: Secondary | ICD-10-CM | POA: Diagnosis not present

## 2017-05-08 ENCOUNTER — Encounter: Payer: Self-pay | Admitting: *Deleted

## 2017-05-31 DIAGNOSIS — B351 Tinea unguium: Secondary | ICD-10-CM | POA: Diagnosis not present

## 2017-05-31 DIAGNOSIS — R2241 Localized swelling, mass and lump, right lower limb: Secondary | ICD-10-CM | POA: Diagnosis not present

## 2017-05-31 DIAGNOSIS — M79671 Pain in right foot: Secondary | ICD-10-CM | POA: Diagnosis not present

## 2017-06-21 ENCOUNTER — Ambulatory Visit (INDEPENDENT_AMBULATORY_CARE_PROVIDER_SITE_OTHER): Payer: BLUE CROSS/BLUE SHIELD | Admitting: Family Medicine

## 2017-06-21 ENCOUNTER — Encounter: Payer: Self-pay | Admitting: Family Medicine

## 2017-06-21 VITALS — BP 118/70 | HR 91 | Temp 98.0°F | Ht 65.0 in | Wt 114.0 lb

## 2017-06-21 DIAGNOSIS — D225 Melanocytic nevi of trunk: Secondary | ICD-10-CM | POA: Diagnosis not present

## 2017-06-21 DIAGNOSIS — E7800 Pure hypercholesterolemia, unspecified: Secondary | ICD-10-CM | POA: Diagnosis not present

## 2017-06-21 DIAGNOSIS — R933 Abnormal findings on diagnostic imaging of other parts of digestive tract: Secondary | ICD-10-CM

## 2017-06-21 DIAGNOSIS — M542 Cervicalgia: Secondary | ICD-10-CM

## 2017-06-21 DIAGNOSIS — E559 Vitamin D deficiency, unspecified: Secondary | ICD-10-CM

## 2017-06-21 DIAGNOSIS — M797 Fibromyalgia: Secondary | ICD-10-CM | POA: Diagnosis not present

## 2017-06-21 DIAGNOSIS — K219 Gastro-esophageal reflux disease without esophagitis: Secondary | ICD-10-CM | POA: Diagnosis not present

## 2017-06-21 DIAGNOSIS — Z8249 Family history of ischemic heart disease and other diseases of the circulatory system: Secondary | ICD-10-CM | POA: Diagnosis not present

## 2017-06-21 MED ORDER — ALPRAZOLAM 0.5 MG PO TABS: 0.5000 mg | ORAL_TABLET | Freq: Every evening | ORAL | 1 refills | Status: DC | PRN

## 2017-06-21 MED ORDER — CARISOPRODOL 350 MG PO TABS: 350.0000 mg | ORAL_TABLET | Freq: Two times a day (BID) | ORAL | 1 refills | Status: DC | PRN

## 2017-06-21 MED ORDER — ZOLPIDEM TARTRATE ER 12.5 MG PO TBCR: 12.5000 mg | EXTENDED_RELEASE_TABLET | Freq: Every evening | ORAL | 1 refills | Status: DC | PRN

## 2017-06-21 NOTE — Progress Notes (Signed)
Subjective:    Patient ID: Wanda Robertson, female    DOB: 1956/09/29, 61 y.o.   MRN: [id number]  HPI Pt here for follow up and management of chronic medical problems which includes hyperlipidemia. She is taking medication regularly.The patient today complains of some congestion and a lesion on her back. She is asking for refills for Soma Ambien and Xanax. She has an FOBT at home which she plans to return. She will get lab work today. She will get her mammogram and is up-to-date on [facility] at [facility]. She has not had a chest x-ray for about 4 years and we will encourage her to get one today. We'll also encourage her to follow-up with the cardiologist. The patient denies any chest pain or shortness of breath. She denies any trouble with swallowing heartburn indigestion nausea vomiting diarrhea blood in the stool or black tarry bowel movements. She does have some constipation and she associates this with taking the pain medicines for her fibromyalgia and neck pain. She takes MiraLAX for this. She tells me today that her father had colon cancer and he died back in his 48s but she had been seeing the gastroenterologist regularly and they have done several colonoscopies and her groin postponed the most recent one for 10 years. We will go ahead and make an appointment with her to see the gastroenterologist that is there now and he can make a decision if she needs to have colonoscopy sooner than next February. She is passing her water without problems. She has her pelvic exams regularly. She does have FOBT at home which she will return. She plans to get her mammogram soon.    Patient Active Problem List   Diagnosis Date Noted  . 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 Family history of heart disease 02/25/2014  . Raynaud's disease 02/25/2014  . Hyperlipemia 05/15/2013  . GERD (gastroesophageal reflux disease) 05/15/2013  . Fibromyalgia 05/15/2013  . Chronic neck pain, with peripheral  neuropathy 05/15/2013  . Osteoporosis 05/15/2013  . Hypothyroidism 05/15/2013  . Post menopausal syndrome 08/23/2011   Outpatient Encounter Prescriptions as of 06/21/2017  Medication Sig  . ALPRAZolam (XANAX) 0.5 MG tablet Take 1 tablet (0.5 mg total) by mouth at bedtime as needed.  Marland Kitchen aspirin EC 81 MG tablet Take 1 tablet (81 mg total) by mouth daily.  . Biotin 1000 MCG tablet Take 1,000 mcg by mouth 3 (three) times daily.  . Calcium Carbonate-Vitamin D (CALCIUM PLUS VITAMIN D) 300-100 MG-UNIT CAPS Take 4 tablets by mouth daily.  . carisoprodol (SOMA) 350 MG tablet Take 1 tablet (350 mg total) by mouth 2 (two) times daily as needed.  . clobetasol (TEMOVATE) 0.05 % GEL Apply to affected area BID PRN  . Coenzyme Q10 (CO Q 10 PO) Take by mouth.  . Cyanocobalamin (VITAMIN B 12 PO) Take by mouth.  . fish oil-omega-3 fatty acids 1000 MG capsule Take 2 g by mouth daily.  . Flaxseed, Linseed, (FLAX SEED OIL PO) Take by mouth.  . isometheptene-acetaminophen-dichloralphenazone (MIDRIN) 65-100-325 MG capsule Take 1 capsule by mouth 4 (four) times daily as needed for migraine. Maximum 5 capsules in 12 hours for migraine headaches, 8 capsules in 24 hours for tension headaches.  Marland Kitchen KRILL OIL PO Take by mouth.  . levothyroxine (SYNTHROID) 50 MCG tablet Take 1 tablet (50 mcg total) by mouth daily.  Marland Kitchen morphine (KADIAN) 100 MG 24 hr capsule Take 100 mg by mouth 2 (two) times daily.   Marland Kitchen  Multiple Vitamins-Minerals (CENTRUM SILVER PO) Take 1 tablet by mouth daily.  . NONFORMULARY OR COMPOUNDED ITEM Apply 1-2 g topically daily. [employer] Nail lacquer: Fluconazole 2%, Terbinafine 1%, DMSO  . paregoric 2 MG/5ML solution Take 1 teaspoon by mouth every 8-12 hours as needed for severe diarrhea  . traMADol (ULTRAM) 50 MG tablet Take 50 mg by mouth 2 (two) times daily.  Marland Kitchen UNABLE TO FIND Med Name: NitroBID cream - from rheumotology - topical for hands  . zolpidem (AMBIEN CR) 12.5 MG CR tablet Take 1 tablet (12.5 mg total)  by mouth at bedtime as needed for sleep.  . [DISCONTINUED] terbinafine (LAMISIL) 250 MG tablet Take 1 tablet (250 mg total) by mouth daily.  . [DISCONTINUED] Clidinium-Chlordiazepoxide (LIBRAX PO) Take by mouth as needed.     No facility-administered encounter medications on file as of 06/21/2017.        Review of Systems  Constitutional: Negative.   HENT: Positive for congestion.   Eyes: Negative.   Respiratory: Negative.   Cardiovascular: Negative.   Gastrointestinal: Negative.   Endocrine: Negative.   Genitourinary: Negative.   Musculoskeletal: Negative.   Skin: Negative.   Allergic/Immunologic: Negative.   Neurological: Negative.   Hematological: Negative.   Psychiatric/Behavioral: Negative.        Objective:   Physical Exam  Constitutional: She is oriented to person, place, and time. She appears well-developed and well-nourished. No distress.  The patient is pleasant and doing well and appears much younger than her stated age  HENT:  Head: Normocephalic and atraumatic.  Right Ear: External ear normal.  Left Ear: External ear normal.  Nose: Nose normal.  Mouth/Throat: Oropharynx is clear and moist. No oropharyngeal exudate.  Eyes: Pupils are equal, round, and reactive to light. Conjunctivae and EOM are normal. Right eye exhibits no discharge. Left eye exhibits no discharge. No scleral icterus.  Neck: Normal range of motion. Neck supple. No thyromegaly present.  No bruits thyromegaly or anterior cervical adenopathy  Cardiovascular: Normal rate, regular rhythm, normal heart sounds and intact distal pulses.   No murmur heard. Heart is regular at 84/m  Pulmonary/Chest: Effort normal and breath sounds normal. No respiratory distress. She has no wheezes. She has no rales.  Clear anteriorly and posteriorly  Abdominal: Soft. Bowel sounds are normal. She exhibits no mass. There is no tenderness. There is no rebound and no guarding.  No liver or spleen enlargement no bruits and  no suprapubic tenderness with good inguinal pulses  Musculoskeletal: Normal range of motion. She exhibits no edema or tenderness.  Lymphadenopathy:    She has no cervical adenopathy.  Neurological: She is alert and oriented to person, place, and time. She has normal reflexes. No cranial nerve deficit.  Skin: Skin is warm and dry. No rash noted.  Small lesion that is very irritating which appears to be a benign lesion above the bra line just lateral to the spine. Cryotherapy to this area will be done.  Psychiatric: She has a normal mood and affect. Her behavior is normal. Judgment and thought content normal.  Nursing note and vitals reviewed.   BP 118/70 (BP Location: Left Arm)   Pulse 91   Temp 98 F (36.7 C) (Oral)   Ht 5' 5" (1.651 m)   Wt 114 lb (51.7 kg)   BMI 18.97 kg/m        Assessment & Plan:  1. Pure hypercholesterolemia -Continue with aggressive therapeutic lifestyle changes pending results of lab work - CBC with Differential/Platelet - BMP8+EGFR -  Lipid panel  2. Vitamin D deficiency -Continue with vitamin D replacement pending results of lab work - CBC with Differential/Platelet - VITAMIN D 25 Hydroxy (Vit-D Deficiency, Fractures)  3. Gastroesophageal reflux disease, esophagitis presence not specified -Continue to watch diet closely and avoid irritating foods - CBC with Differential/Platelet - Hepatic function panel  4. Fibromyalgia -Continue current treatment - CBC with Differential/Platelet  5. Family history of heart disease -Appointment with cardiology for follow-up -Continue current management  7. Irritated nevus of upper back excluding scapular region -Cryotherapy to irritated lesion  8. Opioid-induced constipation -Patient will discuss treatment of this with gastroenterologist when her colonoscopy is completed  Meds ordered this encounter  Medications  . ALPRAZolam (XANAX) 0.5 MG tablet    Sig: Take 1 tablet (0.5 mg total) by mouth at  bedtime as needed.    Dispense:  90 tablet    Refill:  1  . zolpidem (AMBIEN CR) 12.5 MG CR tablet    Sig: Take 1 tablet (12.5 mg total) by mouth at bedtime as needed for sleep.    Dispense:  90 tablet    Refill:  1  . carisoprodol (SOMA) 350 MG tablet    Sig: Take 1 tablet (350 mg total) by mouth 2 (two) times daily as needed.    Dispense:  180 tablet    Refill:  1   Patient Instructions  Continue current medications. Continue good therapeutic lifestyle changes which include good diet and exercise. Fall precautions discussed with patient. If an FOBT was given today- please return it to our front desk. If you are over 50 years old - you may need Prevnar 13 or the adult Pneumonia vaccine.  **Flu shots are available--- please call and schedule a FLU-CLINIC appointment**  After your visit with us today you will receive a survey in the mail or online from [employer] regarding your care with us. Please take a moment to fill this out. Your feedback is very important to us as you can help us better understand your patient needs as well as improve your experience and satisfaction. WE CARE ABOUT YOU!!!   Stay active physically and walk and exercise regularly Drink plenty of fluids and stay well hydrated Always be careful and do not put yourself at risk for falling Follow-up with gastroenterology as planned Follow-up with cardiology as planned We will call with your blood work results as soon as these results become available  Don W. Moore MD   

## 2017-06-21 NOTE — Patient Instructions (Addendum)
Continue current medications. Continue good therapeutic lifestyle changes which include good diet and exercise. Fall precautions discussed with patient. If an FOBT was given today- please return it to our front desk. If you are over 61 years old - you may need Prevnar 32 or the adult Pneumonia vaccine.  **Flu shots are available--- please call and schedule a FLU-CLINIC appointment**  After your visit with [place] today you will receive a survey in the mail or online from [employer] regarding your care with [place]. Please take a moment to fill this out. Your feedback is very important to [place] as you can help [place] better understand your patient needs as well as improve your experience and satisfaction. WE CARE ABOUT YOU!!!   Stay active physically and walk and exercise regularly Drink plenty of fluids and stay well hydrated Always be careful and do not put yourself at risk for falling Follow-up with gastroenterology as planned Follow-up with cardiology as planned We will call with your blood work results as soon as these results become available

## 2017-06-22 ENCOUNTER — Ambulatory Visit: Payer: BLUE CROSS/BLUE SHIELD | Admitting: Family Medicine

## 2017-06-22 LAB — CBC WITH DIFFERENTIAL/PLATELET
BASOS: 1 %
Basophils Absolute: 0 10*3/uL (ref 0.0–0.2)
EOS (ABSOLUTE): 0.1 10*3/uL (ref 0.0–0.4)
EOS: 2 %
HEMATOCRIT: 36.9 % (ref 34.0–46.6)
Hemoglobin: 12.5 g/dL (ref 11.1–15.9)
IMMATURE GRANS (ABS): 0 10*3/uL (ref 0.0–0.1)
Immature Granulocytes: 0 %
Lymphocytes Absolute: 1.7 10*3/uL (ref 0.7–3.1)
Lymphs: 39 %
MCH: 32.6 pg (ref 26.6–33.0)
MCHC: 33.9 g/dL (ref 31.5–35.7)
MCV: 96 fL (ref 79–97)
MONOS ABS: 0.3 10*3/uL (ref 0.1–0.9)
Monocytes: 8 %
NEUTROS ABS: 2.1 10*3/uL (ref 1.4–7.0)
Neutrophils: 50 %
Platelets: 242 10*3/uL (ref 150–379)
RBC: 3.84 x10E6/uL (ref 3.77–5.28)
RDW: 13.7 % (ref 12.3–15.4)
WBC: 4.2 10*3/uL (ref 3.4–10.8)

## 2017-06-22 LAB — LIPID PANEL
CHOLESTEROL TOTAL: 167 mg/dL (ref 100–199)
Chol/HDL Ratio: 2.7 ratio (ref 0.0–4.4)
HDL: 62 mg/dL (ref >39)
LDL Calculated: 98 mg/dL (ref 0–99)
TRIGLYCERIDES: 37 mg/dL (ref 0–149)
VLDL CHOLESTEROL CAL: 7 mg/dL (ref 5–40)

## 2017-06-22 LAB — BMP8+EGFR
BUN / CREAT RATIO: 14 (ref 12–28)
BUN: 10 mg/dL (ref 8–27)
CO2: 26 mmol/L (ref 20–29)
Calcium: 9.5 mg/dL (ref 8.7–10.3)
Chloride: 99 mmol/L (ref 96–106)
Creatinine, Ser: 0.72 mg/dL (ref 0.57–1.00)
GFR, EST AFRICAN AMERICAN: 105 mL/min/{1.73_m2} (ref >59)
GFR, EST NON AFRICAN AMERICAN: 91 mL/min/{1.73_m2} (ref >59)
Glucose: 83 mg/dL (ref 65–99)
Potassium: 4.5 mmol/L (ref 3.5–5.2)
Sodium: 139 mmol/L (ref 134–144)

## 2017-06-22 LAB — HEPATIC FUNCTION PANEL
ALK PHOS: 47 IU/L (ref 39–117)
ALT: 15 IU/L (ref 0–32)
AST: 20 IU/L (ref 0–40)
Albumin: 4.4 g/dL (ref 3.6–4.8)
BILIRUBIN, DIRECT: 0.11 mg/dL (ref 0.00–0.40)
Bilirubin Total: 0.3 mg/dL (ref 0.0–1.2)
TOTAL PROTEIN: 6.8 g/dL (ref 6.0–8.5)

## 2017-06-22 LAB — VITAMIN D 25 HYDROXY (VIT D DEFICIENCY, FRACTURES): Vit D, 25-Hydroxy: 52.3 ng/mL (ref 30.0–100.0)

## 2017-06-22 NOTE — Addendum Note (Signed)
Addended by: Zannie Cove on: 06/22/2017 11:07 AM   Modules accepted: Orders

## 2017-06-25 ENCOUNTER — Ambulatory Visit: Payer: BLUE CROSS/BLUE SHIELD | Admitting: Family Medicine

## 2017-06-28 DIAGNOSIS — G894 Chronic pain syndrome: Secondary | ICD-10-CM | POA: Diagnosis not present

## 2017-06-28 DIAGNOSIS — M797 Fibromyalgia: Secondary | ICD-10-CM | POA: Diagnosis not present

## 2017-06-28 DIAGNOSIS — M47812 Spondylosis without myelopathy or radiculopathy, cervical region: Secondary | ICD-10-CM | POA: Diagnosis not present

## 2017-06-28 DIAGNOSIS — Z79891 Long term (current) use of opiate analgesic: Secondary | ICD-10-CM | POA: Diagnosis not present

## 2017-07-19 DIAGNOSIS — Z1231 Encounter for screening mammogram for malignant neoplasm of breast: Secondary | ICD-10-CM | POA: Diagnosis not present

## 2017-08-16 DIAGNOSIS — M79671 Pain in right foot: Secondary | ICD-10-CM | POA: Diagnosis not present

## 2017-08-16 DIAGNOSIS — R2241 Localized swelling, mass and lump, right lower limb: Secondary | ICD-10-CM | POA: Diagnosis not present

## 2017-08-27 ENCOUNTER — Encounter: Payer: Self-pay | Admitting: Family Medicine

## 2017-09-04 DIAGNOSIS — M542 Cervicalgia: Secondary | ICD-10-CM

## 2017-09-04 DIAGNOSIS — K589 Irritable bowel syndrome without diarrhea: Secondary | ICD-10-CM | POA: Insufficient documentation

## 2017-09-04 DIAGNOSIS — M8580 Other specified disorders of bone density and structure, unspecified site: Secondary | ICD-10-CM | POA: Insufficient documentation

## 2017-09-04 DIAGNOSIS — M549 Dorsalgia, unspecified: Secondary | ICD-10-CM

## 2017-09-04 DIAGNOSIS — G8929 Other chronic pain: Secondary | ICD-10-CM | POA: Insufficient documentation

## 2017-09-05 DIAGNOSIS — R2241 Localized swelling, mass and lump, right lower limb: Secondary | ICD-10-CM | POA: Diagnosis not present

## 2017-09-13 DIAGNOSIS — M797 Fibromyalgia: Secondary | ICD-10-CM | POA: Diagnosis not present

## 2017-09-13 DIAGNOSIS — Z79891 Long term (current) use of opiate analgesic: Secondary | ICD-10-CM | POA: Diagnosis not present

## 2017-09-13 DIAGNOSIS — M47812 Spondylosis without myelopathy or radiculopathy, cervical region: Secondary | ICD-10-CM | POA: Diagnosis not present

## 2017-09-13 DIAGNOSIS — G894 Chronic pain syndrome: Secondary | ICD-10-CM | POA: Diagnosis not present

## 2017-09-13 DIAGNOSIS — R2241 Localized swelling, mass and lump, right lower limb: Secondary | ICD-10-CM | POA: Diagnosis not present

## 2017-09-17 ENCOUNTER — Ambulatory Visit: Payer: BLUE CROSS/BLUE SHIELD | Admitting: Cardiology

## 2017-09-17 ENCOUNTER — Encounter: Payer: Self-pay | Admitting: Cardiology

## 2017-09-17 DIAGNOSIS — Z8249 Family history of ischemic heart disease and other diseases of the circulatory system: Secondary | ICD-10-CM | POA: Diagnosis not present

## 2017-09-17 DIAGNOSIS — R9431 Abnormal electrocardiogram [ECG] [EKG]: Secondary | ICD-10-CM

## 2017-09-17 DIAGNOSIS — R931 Abnormal findings on diagnostic imaging of heart and coronary circulation: Secondary | ICD-10-CM | POA: Diagnosis not present

## 2017-09-17 MED ORDER — METOPROLOL TARTRATE 50 MG PO TABS: 50.0000 mg | ORAL_TABLET | Freq: Two times a day (BID) | ORAL | 0 refills | Status: DC

## 2017-09-17 NOTE — Progress Notes (Signed)
Patient Name: Wanda Robertson Date of Encounter: 09/17/2017  Primary Care Provider:  Chipper Herb, MD Primary Cardiologist:  Dr Aundra Dubin --> Dr Meda Coffee  Problem List   Past Medical History:  Diagnosis Date  . Chronic back pain   . Chronic neck pain   . Family history of heart disease 02/25/2014  . Fibromyalgia 05/15/2013  . GERD (gastroesophageal reflux disease) 05/15/2013  . Hyperlipemia 05/15/2013  . Hypothyroidism 05/15/2013  . IBS (irritable bowel syndrome)   . Osteopenia   . Osteoporosis 05/15/2013   History of past treatment with Forte[facility]   . Raynaud's disease 02/25/2014   Past Surgical History:  Procedure Laterality Date  . TONSILLECTOMY      Allergies  Allergies  Allergen Reactions  . Compazine [Prochlorperazine Edisylate] Other (See Comments)    Severe muscle requiring ER visit  . Penicillins Hives  . Prochlorperazine   . Sulfamethoxazole-Trimethoprim   . Sulfa Antibiotics Rash   HPI  61 year old female previously followed by Dr Aundra Dubin as a preventive cardiology. She is a younger appearing female with very healthy weight and diet - vegan for the last 20 years and moderate amount of exercise including yoga. She denies any symptoms of chest pain, SOB, LE edema, orthopnea, PND.  She has a very significant FH of early CAD, her mother had MI in her 23' and died of another MI in early 75'. Her brother had a stent placed at age 6, still living in his 98'.  She had a calcium score done in 2015 that was 20.25 Agatston units. This was 78th percentile for age and sex matched control. She is not on statin.   Home Medications  Prior to Admission medications   Medication Sig Start Date End Date Taking? Authorizing Provider  ALPRAZolam Duanne Moron) 0.5 MG tablet Take 1 tablet (0.5 mg total) by mouth at bedtime as needed. 06/21/17  Yes Chipper Herb, MD  aspirin EC 81 MG tablet Take 1 tablet (81 mg total) by mouth daily. 08/21/14  Yes Larey Dresser, MD  Biotin 1000 MCG tablet  Take 1,000 mcg by mouth 3 (three) times daily.   Yes [provider]  Calcium Carbonate-Vitamin D (CALCIUM PLUS VITAMIN D) 300-100 MG-UNIT CAPS Take 4 tablets by mouth daily.   Yes [provider]  carisoprodol (SOMA) 350 MG tablet Take 1 tablet (350 mg total) by mouth 2 (two) times daily as needed. 06/21/17  Yes Chipper Herb, MD  clobetasol (TEMOVATE) 0.05 % GEL Apply to affected area BID PRN 05/24/15  Yes Chipper Herb, MD  Coenzyme Q10 (CO Q 10 PO) Take by mouth.   Yes [provider]  Cyanocobalamin (VITAMIN B 12 PO) Take by mouth.   Yes [provider]  fish oil-omega-3 fatty acids 1000 MG capsule Take 2 g by mouth daily.   Yes [provider]  Flaxseed, Linseed, (FLAX SEED OIL PO) Take by mouth.   Yes [provider]  isometheptene-acetaminophen-dichloralphenazone (MIDRIN) 65-100-325 MG capsule Take 1 capsule by mouth 4 (four) times daily as needed for migraine. Maximum 5 capsules in 12 hours for migraine headaches, 8 capsules in 24 hours for tension headaches. 01/04/17  Yes Chipper Herb, MD  KRILL OIL PO Take by mouth.   Yes [provider]  levothyroxine (SYNTHROID) 50 MCG tablet Take 1 tablet (50 mcg total) by mouth daily. 06/12/16  Yes Chipper Herb, MD  morphine (KADIAN) 80 MG 24 hr capsule Take 80 mg by  mouth 2 (two) times daily.   Yes [provider]  Multiple Vitamins-Minerals (CENTRUM SILVER PO) Take 1 tablet by mouth daily.   Yes [provider]  NONFORMULARY OR COMPOUNDED ITEM Apply 1-2 g topically daily. [employer] Nail lacquer: Fluconazole 2%, Terbinafine 1%, DMSO 11/30/16  Yes Regal, Tamala Fothergill, DPM  paregoric 2 MG/5ML solution Take 1 teaspoon by mouth every 8-12 hours as needed for severe diarrhea 06/12/16  Yes Chipper Herb, MD  Terbinafine HCl (LAMISIL PO) Take 1 tablet by mouth daily.   Yes [provider]  traMADol (ULTRAM) 50 MG tablet Take 50 mg by mouth 2 (two) times daily.   Yes  [provider]  UNABLE TO FIND Med Name: NitroBID cream - from rheumotology - topical for hands   Yes [provider]  zolpidem (AMBIEN CR) 12.5 MG CR tablet Take 1 tablet (12.5 mg total) by mouth at bedtime as needed for sleep. 06/21/17  Yes Chipper Herb, MD   Family History  Family History  Problem Relation Age of Onset  . Heart disease Mother   . Heart disease Father   . Heart disease Unknown        family history   . Colon cancer Unknown        family history    Social History  Social History   Socioeconomic History  . Marital status: Married    Spouse name: Not on file  . Number of children: Not on file  . Years of education: Not on file  . Highest education level: Not on file  Social Needs  . Financial resource strain: Not on file  . Food insecurity - worry: Not on file  . Food insecurity - inability: Not on file  . Transportation needs - medical: Not on file  . Transportation needs - non-medical: Not on file  Occupational History  . Not on file  Tobacco Use  . Smoking status: Never Smoker  . Smokeless tobacco: Never Used  Substance and Sexual Activity  . Alcohol use: No  . Drug use: No  . Sexual activity: Not on file  Other Topics Concern  . Not on file  Social History Narrative  . Not on file    Review of Systems, as per HPI, otherwise negative General:  No chills, fever, night sweats or weight changes.  Cardiovascular:  No chest pain, dyspnea on exertion, edema, orthopnea, palpitations, paroxysmal nocturnal dyspnea. Dermatological: No rash, lesions/masses Respiratory: No cough, dyspnea Urologic: No hematuria, dysuria Abdominal:   No nausea, vomiting, diarrhea, bright red blood per rectum, melena, or hematemesis Neurologic:  No visual changes, wkns, changes in mental status. All other systems reviewed and are otherwise negative except as noted above.  Physical Exam  Blood pressure 134/72, pulse 99, height 5\' 5"  (1.651 m), weight 113  lb (51.3 kg).  General: Pleasant, NAD Psych: Normal affect. Neuro: Alert and oriented X 3. Moves all extremities spontaneously. HEENT: Normal  Neck: Supple without bruits or JVD. Lungs:  Resp regular and unlabored, CTA. Heart: RRR no s3, s4, or murmurs. Abdomen: Soft, non-tender, non-distended, BS + x 4.  Extremities: No clubbing, cyanosis or edema. DP/PT/Radials 2+ and equal bilaterally.  Labs:  No results for input(s): CKTOTAL, CKMB, TROPONINI in the last 72 hours. Lab Results  Component Value Date   WBC 4.2 06/21/2017   HGB 12.5 06/21/2017   HCT 36.9 06/21/2017   MCV 96 06/21/2017   PLT 242 06/21/2017    No results found for: DDIMER  Invalid input(s): POCBNP    Component Value Date/Time   NA 139 06/21/2017 1502   K 4.5 06/21/2017 1502   CL 99 06/21/2017 1502   CO2 26 06/21/2017 1502   GLUCOSE 83 06/21/2017 1502   GLUCOSE 92 09/26/2007 0140   BUN 10 06/21/2017 1502   CREATININE 0.72 06/21/2017 1502   CALCIUM 9.5 06/21/2017 1502   PROT 6.8 06/21/2017 1502   ALBUMIN 4.4 06/21/2017 1502   AST 20 06/21/2017 1502   ALT 15 06/21/2017 1502   ALKPHOS 47 06/21/2017 1502   BILITOT 0.3 06/21/2017 1502   GFRNONAA 91 06/21/2017 1502   GFRAA 105 06/21/2017 1502   Lab Results  Component Value Date   CHOL 167 06/21/2017   HDL 62 06/21/2017   LDLCALC 98 06/21/2017   TRIG 37 06/21/2017    Accessory Clinical Findings  Echocardiogram - none  ECG - SR, normal ECG   Assessment & Plan  1. CAD - Coronary calcium score of 20.25 Agatston units. This was 78th percentile for age and sex matched control. We will repeat given her high risk with family history and high calcium score despite optimal diet and exercise.  We will repeat calcium score and perform coronary CTA, there is an evidence that consecutive data can affect management and prognosis. She is reluctant to take statins, but is open to red yeast rice.    2. Family history of early CAD   Ena Dawley, MD,  [facility] 09/17/2017, 2:40 PM

## 2017-09-17 NOTE — Patient Instructions (Addendum)
Medication Instructions:   Your physician recommends that you continue on your current medications as directed. Please refer to the Current Medication list given to you today.    Testing/Procedures:  CORONARY CT WITH FFR   Please arrive at the [facility] main entrance of [facility] at xx:xx AM (30-45 minutes prior to test start time)  [facility] [address] [place], [place] [address] [phone]  Proceed to the [facility] Radiology Department (First Floor).  Please follow these instructions carefully (unless otherwise directed):    On the Night Before the Test: . Drink plenty of water. . Do not consume any caffeinated/decaffeinated beverages or chocolate 12 hours prior to your test. . Do not take any antihistamines 12 hours prior to your test.   On the Day of the Test: . Drink plenty of water. Do not drink any water within one hour of the test. . Do not eat any food 4 hours prior to the test. . You may take your regular medications prior to the test. . IF NOT ON A BETA BLOCKER - Take 50 mg of lopressor (metoprolol) one hour before the test.   After the Test: . Drink plenty of water. . After receiving IV contrast, you may experience a mild flushed feeling. This is normal. . On occasion, you may experience a mild rash up to 24 hours after the test. This is not dangerous. If this occurs, you can take Benadryl 25 mg and increase your fluid intake. . If you experience trouble breathing, this can be serious. If it is severe call 911 IMMEDIATELY. If it is mild, please call our office.    Follow-Up:  Your physician wants you to follow-up in: [place] will receive a reminder letter in the mail two months in advance. If you don't receive a letter, please call our office to schedule the follow-up appointment.      If you need a refill on your cardiac medications before your next appointment, please call your pharmacy.

## 2017-09-28 DIAGNOSIS — G5761 Lesion of plantar nerve, right lower limb: Secondary | ICD-10-CM | POA: Diagnosis not present

## 2017-09-28 DIAGNOSIS — M7741 Metatarsalgia, right foot: Secondary | ICD-10-CM | POA: Diagnosis not present

## 2017-09-28 DIAGNOSIS — M79671 Pain in right foot: Secondary | ICD-10-CM | POA: Diagnosis not present

## 2017-11-01 ENCOUNTER — Telehealth: Payer: Self-pay | Admitting: *Deleted

## 2017-11-01 NOTE — Telephone Encounter (Signed)
-----   Message from Armando Gang sent at 11/01/2017  3:53 PM EST ----- Regarding: RE: FOLLOWING UP ON CORONARY CT Hey spoke with patient and she don't want to do the test due to the cost.  She stated she will discuss this with Meda Coffee at the next office visit/saf ----- Message ----- From: Philbert Riser Sent: 10/12/2017  12:42 PM To: Nuala Alpha, LPN, Armando Gang Subject: RE: FOLLOWING UP ON CORONARY CT                Her Pre Auth. Came back today. ----- Message ----- From: Nuala Alpha, LPN Sent: [id number]   8:05 AM To: [facility] Subject: FOLLOWING UP ON CORONARY CT                    Would you guys know the status of her cardiac ct being scheduled? Meda Coffee wanted me to follow-up on this.  Thanks ladies,   Karlene Einstein

## 2017-11-01 NOTE — Telephone Encounter (Signed)
Will send to Dr Meda Coffee as an Juluis Rainier only.

## 2017-11-05 DIAGNOSIS — M797 Fibromyalgia: Secondary | ICD-10-CM | POA: Diagnosis not present

## 2017-11-05 DIAGNOSIS — M47812 Spondylosis without myelopathy or radiculopathy, cervical region: Secondary | ICD-10-CM | POA: Diagnosis not present

## 2017-11-05 DIAGNOSIS — Z79899 Other long term (current) drug therapy: Secondary | ICD-10-CM | POA: Diagnosis not present

## 2017-11-05 DIAGNOSIS — G894 Chronic pain syndrome: Secondary | ICD-10-CM | POA: Diagnosis not present

## 2017-11-05 DIAGNOSIS — Z79891 Long term (current) use of opiate analgesic: Secondary | ICD-10-CM | POA: Diagnosis not present

## 2017-12-20 ENCOUNTER — Encounter: Payer: Self-pay | Admitting: Family Medicine

## 2017-12-20 ENCOUNTER — Ambulatory Visit: Payer: BLUE CROSS/BLUE SHIELD | Admitting: Family Medicine

## 2017-12-20 VITALS — BP 127/77 | HR 98 | Temp 98.3°F | Ht 65.0 in | Wt 109.0 lb

## 2017-12-20 DIAGNOSIS — E7800 Pure hypercholesterolemia, unspecified: Secondary | ICD-10-CM

## 2017-12-20 DIAGNOSIS — M797 Fibromyalgia: Secondary | ICD-10-CM | POA: Diagnosis not present

## 2017-12-20 DIAGNOSIS — Z8249 Family history of ischemic heart disease and other diseases of the circulatory system: Secondary | ICD-10-CM | POA: Diagnosis not present

## 2017-12-20 DIAGNOSIS — J301 Allergic rhinitis due to pollen: Secondary | ICD-10-CM | POA: Diagnosis not present

## 2017-12-20 DIAGNOSIS — K219 Gastro-esophageal reflux disease without esophagitis: Secondary | ICD-10-CM

## 2017-12-20 DIAGNOSIS — E039 Hypothyroidism, unspecified: Secondary | ICD-10-CM | POA: Diagnosis not present

## 2017-12-20 DIAGNOSIS — E559 Vitamin D deficiency, unspecified: Secondary | ICD-10-CM | POA: Diagnosis not present

## 2017-12-20 MED ORDER — ZOLPIDEM TARTRATE ER 12.5 MG PO TBCR: 12.5000 mg | EXTENDED_RELEASE_TABLET | Freq: Every evening | ORAL | 1 refills | Status: DC | PRN

## 2017-12-20 MED ORDER — PAREGORIC 2 MG/5ML PO TINC: ORAL | 1 refills | Status: DC

## 2017-12-20 MED ORDER — ALPRAZOLAM 0.5 MG PO TABS: 0.5000 mg | ORAL_TABLET | Freq: Every evening | ORAL | 1 refills | Status: DC | PRN

## 2017-12-20 NOTE — Progress Notes (Signed)
Subjective:    Patient ID: Wanda Robertson, female    DOB: 09-23-1956, 62 y.o.   MRN: [id number]  HPI Pt here for follow up and management of chronic medical problems which includes hyperlipidemia and GERD. She is taking medication regularly.  The patient is doing well overall and does complain of allergies.  She is requesting refills on several of her medicines.  She is due to get or be given an FOBT today and get lab work today.  She is also due to get her pelvic exam and Pap smear.  Her mammogram is up-to-date.  She did see Dr. Meda Coffee, the cardiologist who recommended a repeat coronary CT scan but the patient has delayed this because of the expense of the test.  We had a long talk about the patient's family history and she remains concerned about her cardiac status.  She does have fibromyalgia and does have aches and pains sometimes sharp sometimes dull in her left neck and shoulder areas.  She denies any chest pain pressure or tightness or shortness of breath.  She is due to get a colonoscopy this year and is currently waiting to find out when that is going to be done and this will be arranged by Dr. Hilarie Fredrickson.  Currently she denies any complaints with nausea vomiting diarrhea blood in the stool or black tarry bowel movements.  She is passing her water without problems.     Patient Active Problem List   Diagnosis Date Noted  . Family history of early CAD 09/17/2017  . Abnormal EKG 09/17/2017  . Abnormal cardiac CT angiography 09/17/2017  . Osteopenia   . IBS (irritable bowel syndrome)   . Chronic neck pain   . Chronic back pain   . 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 Family history of heart disease 02/25/2014  . Raynaud's disease 02/25/2014  . Hyperlipemia 05/15/2013  . GERD (gastroesophageal reflux disease) 05/15/2013  . Fibromyalgia 05/15/2013  . Cervicalgia 05/15/2013  . Osteoporosis 05/15/2013  . Hypothyroidism 05/15/2013  . Post menopausal syndrome 08/23/2011  . Post-menopause  on HRT (hormone replacement therapy) 08/23/2011   Outpatient Encounter Medications as of 12/20/2017  Medication Sig  . ALPRAZolam (XANAX) 0.5 MG tablet Take 1 tablet (0.5 mg total) by mouth at bedtime as needed.  Marland Kitchen aspirin EC 81 MG tablet Take 1 tablet (81 mg total) by mouth daily.  . Biotin 1000 MCG tablet Take 1,000 mcg by mouth 3 (three) times daily.  . Calcium Carbonate-Vitamin D (CALCIUM PLUS VITAMIN D) 300-100 MG-UNIT CAPS Take 4 tablets by mouth daily.  . carisoprodol (SOMA) 350 MG tablet Take 1 tablet (350 mg total) by mouth 2 (two) times daily as needed.  . clobetasol (TEMOVATE) 0.05 % GEL Apply to affected area BID PRN  . Coenzyme Q10 (CO Q 10 PO) Take by mouth.  . Cyanocobalamin (VITAMIN B 12 PO) Take by mouth.  . fish oil-omega-3 fatty acids 1000 MG capsule Take 2 g by mouth daily.  . Flaxseed, Linseed, (FLAX SEED OIL PO) Take by mouth.  . isometheptene-acetaminophen-dichloralphenazone (MIDRIN) 65-100-325 MG capsule Take 1 capsule by mouth 4 (four) times daily as needed for migraine. Maximum 5 capsules in 12 hours for migraine headaches, 8 capsules in 24 hours for tension headaches.  Marland Kitchen KRILL OIL PO Take by mouth.  . levothyroxine (SYNTHROID) 50 MCG tablet Take 1 tablet (50 mcg total) by mouth daily.  Marland Kitchen morphine (KADIAN) 80 MG 24 hr capsule Take 80 mg by mouth 2 (two) times daily.  Marland Kitchen  Multiple Vitamins-Minerals (CENTRUM SILVER PO) Take 1 tablet by mouth daily.  . NONFORMULARY OR COMPOUNDED ITEM Apply 1-2 g topically daily. [employer] Nail lacquer: Fluconazole 2%, Terbinafine 1%, DMSO  . paregoric 2 MG/5ML solution Take 1 teaspoon by mouth every 8-12 hours as needed for severe diarrhea  . Terbinafine HCl (LAMISIL PO) Take 1 tablet by mouth daily.  . traMADol (ULTRAM) 50 MG tablet Take 50 mg by mouth 2 (two) times daily.  Marland Kitchen UNABLE TO FIND Med Name: NitroBID cream - from rheumotology - topical for hands  . zolpidem (AMBIEN CR) 12.5 MG CR tablet Take 1 tablet (12.5 mg total) by mouth at  bedtime as needed for sleep.  . metoprolol tartrate (LOPRESSOR) 50 MG tablet Take 1 tablet (50 mg total) by mouth 2 (two) times daily. Take this 1 hour prior to your cardiac ct.   No facility-administered encounter medications on file as of 12/20/2017.      Review of Systems  Constitutional: Negative.   HENT: Negative.        Seasonal allergies  Eyes: Negative.   Respiratory: Negative.   Cardiovascular: Negative.   Gastrointestinal: Negative.   Endocrine: Negative.   Genitourinary: Negative.   Musculoskeletal: Negative.   Skin: Negative.   Allergic/Immunologic: Negative.   Neurological: Negative.   Hematological: Negative.   Psychiatric/Behavioral: Negative.        Objective:   Physical Exam  Constitutional: She is oriented to person, place, and time. She appears well-developed and well-nourished. No distress.  The patient is small framed pleasant and relaxed and feeling somewhat congested recently  HENT:  Head: Normocephalic and atraumatic.  Right Ear: External ear normal.  Left Ear: External ear normal.  Mouth/Throat: Oropharynx is clear and moist. No oropharyngeal exudate.  Nasal congestion and redness with septum deviated to the patient's right.  Eyes: Conjunctivae and EOM are normal. Pupils are equal, round, and reactive to light. Right eye exhibits no discharge. Left eye exhibits no discharge. No scleral icterus.  Neck: Normal range of motion. Neck supple. No thyromegaly present.  No bruits thyromegaly or anterior cervical adenopathy  Cardiovascular: Normal rate, regular rhythm, normal heart sounds and intact distal pulses.  No murmur heard. Heart is regular at 96/min  Pulmonary/Chest: Effort normal and breath sounds normal. No respiratory distress. She has no wheezes. She has no rales.  Clear anteriorly and posteriorly  Abdominal: Soft. Bowel sounds are normal. She exhibits no mass. There is tenderness. There is no rebound and no guarding.  Slight epigastric  tenderness.  Patient takes Zegerid as needed.  No liver or spleen enlargement no masses and no bruits  Musculoskeletal: Normal range of motion. She exhibits no edema or tenderness.  Lymphadenopathy:    She has no cervical adenopathy.  Neurological: She is alert and oriented to person, place, and time. She has normal reflexes. No cranial nerve deficit.  Skin: Skin is warm and dry. No rash noted.  Psychiatric: She has a normal mood and affect. Her behavior is normal. Judgment and thought content normal.  Nursing note and vitals reviewed.  BP 127/77 (BP Location: Left Arm)   Pulse 98   Temp 98.3 F (36.8 C) (Oral)   Ht _0  (1.651 m)   Wt 109 lb (49.4 kg)   BMI 18.14 kg/m         Assessment & Plan:  1. Pure hypercholesterolemia -Continue with aggressive therapeutic lifestyle changes because of statin intolerance - BMP8+EGFR - Lipid panel - CBC with Differential/Platelet - Hepatic function panel  2. Vitamin D deficiency -Continue with current treatment pending results of lab work - CBC with Differential/Platelet - VITAMIN D 25 Hydroxy (Vit-D Deficiency, Fractures)  3. Gastroesophageal reflux disease, esophagitis presence not specified -Continue to follow a diet of avoidance and use Zegerid as needed - CBC with Differential/Platelet  4. Fibromyalgia -Continue with pain medicines as currently doing and is currently prescribed - CBC with Differential/Platelet  5. Family history of heart disease -Follow-up with Dr. Meda Coffee and consider cardiac CT - CBC with Differential/Platelet  6. Hypothyroidism, unspecified type -Continue current treatment pending results of lab work - Thyroid Panel With TSH  7. Seasonal allergic rhinitis due to pollen -Continue with nasal saline, Flonase and Allegra and avoid irritating environments  No orders of the defined types were placed in this encounter.  Patient Instructions  Continue current medications. Continue good therapeutic  lifestyle changes which include good diet and exercise. Fall precautions discussed with patient. If an FOBT was given today- please return it to our front desk. If you are over 36 years old - you may need Prevnar 29 or the adult Pneumonia vaccine.  **Flu shots are available--- please call and schedule a FLU-CLINIC appointment**  After your visit with [place] today you will receive a survey in the mail or online from [employer] regarding your care with [place]. Please take a moment to fill this out. Your feedback is very important to [place] as you can help [place] better understand your patient needs as well as improve your experience and satisfaction. WE CARE ABOUT YOU!!!   Continue with Flonase Continue with Allegra Avoid environments that are irritating Use cool mist humidification Use nasal saline Follow-up with Dr. Meda Coffee as discussed and let her know about your concerns with your family history and the shoulder and neck pain and aching that you have periodically Please let us know if you do get the new shingles shot as discussed.  Arrie Senate MD

## 2017-12-20 NOTE — Patient Instructions (Addendum)
Continue current medications. Continue good therapeutic lifestyle changes which include good diet and exercise. Fall precautions discussed with patient. If an FOBT was given today- please return it to our front desk. If you are over 62 years old - you may need Prevnar 51 or the adult Pneumonia vaccine.  **Flu shots are available--- please call and schedule a FLU-CLINIC appointment**  After your visit with [place] today you will receive a survey in the mail or online from [employer] regarding your care with [place]. Please take a moment to fill this out. Your feedback is very important to [place] as you can help [place] better understand your patient needs as well as improve your experience and satisfaction. WE CARE ABOUT YOU!!!   Continue with Flonase Continue with Allegra Avoid environments that are irritating Use cool mist humidification Use nasal saline Follow-up with Dr. Meda Coffee as discussed and let her know about your concerns with your family history and the shoulder and neck pain and aching that you have periodically Please let us know if you do get the new shingles shot as discussed.

## 2017-12-21 ENCOUNTER — Telehealth: Payer: Self-pay | Admitting: *Deleted

## 2017-12-21 LAB — CBC WITH DIFFERENTIAL/PLATELET
BASOS: 0 %
Basophils Absolute: 0 10*3/uL (ref 0.0–0.2)
EOS (ABSOLUTE): 0.1 10*3/uL (ref 0.0–0.4)
EOS: 1 %
HEMOGLOBIN: 12.4 g/dL (ref 11.1–15.9)
Hematocrit: 37 % (ref 34.0–46.6)
Immature Grans (Abs): 0 10*3/uL (ref 0.0–0.1)
Immature Granulocytes: 0 %
LYMPHS ABS: 1 10*3/uL (ref 0.7–3.1)
Lymphs: 19 %
MCH: 31.6 pg (ref 26.6–33.0)
MCHC: 33.5 g/dL (ref 31.5–35.7)
MCV: 94 fL (ref 79–97)
MONOCYTES: 12 %
Monocytes Absolute: 0.6 10*3/uL (ref 0.1–0.9)
NEUTROS ABS: 3.5 10*3/uL (ref 1.4–7.0)
Neutrophils: 68 %
Platelets: 211 10*3/uL (ref 150–379)
RBC: 3.93 x10E6/uL (ref 3.77–5.28)
RDW: 13.5 % (ref 12.3–15.4)
WBC: 5.2 10*3/uL (ref 3.4–10.8)

## 2017-12-21 LAB — HEPATIC FUNCTION PANEL
ALT: 14 IU/L (ref 0–32)
AST: 19 IU/L (ref 0–40)
Albumin: 4.2 g/dL (ref 3.6–4.8)
Alkaline Phosphatase: 52 IU/L (ref 39–117)
BILIRUBIN TOTAL: 0.2 mg/dL (ref 0.0–1.2)
BILIRUBIN, DIRECT: 0.08 mg/dL (ref 0.00–0.40)
TOTAL PROTEIN: 7 g/dL (ref 6.0–8.5)

## 2017-12-21 LAB — BMP8+EGFR
BUN/Creatinine Ratio: 8 — ABNORMAL LOW (ref 12–28)
BUN: 6 mg/dL — AB (ref 8–27)
CALCIUM: 8.8 mg/dL (ref 8.7–10.3)
CHLORIDE: 101 mmol/L (ref 96–106)
CO2: 27 mmol/L (ref 20–29)
CREATININE: 0.72 mg/dL (ref 0.57–1.00)
GFR calc Af Amer: 105 mL/min/{1.73_m2} (ref >59)
GFR calc non Af Amer: 91 mL/min/{1.73_m2} (ref >59)
GLUCOSE: 75 mg/dL (ref 65–99)
Potassium: 4.3 mmol/L (ref 3.5–5.2)
Sodium: 139 mmol/L (ref 134–144)

## 2017-12-21 LAB — VITAMIN D 25 HYDROXY (VIT D DEFICIENCY, FRACTURES): Vit D, 25-Hydroxy: 50.5 ng/mL (ref 30.0–100.0)

## 2017-12-21 LAB — LIPID PANEL
CHOLESTEROL TOTAL: 148 mg/dL (ref 100–199)
Chol/HDL Ratio: 2.7 ratio (ref 0.0–4.4)
HDL: 55 mg/dL (ref >39)
LDL Calculated: 84 mg/dL (ref 0–99)
Triglycerides: 43 mg/dL (ref 0–149)
VLDL CHOLESTEROL CAL: 9 mg/dL (ref 5–40)

## 2017-12-21 LAB — THYROID PANEL WITH TSH
Free Thyroxine Index: 1.6 (ref 1.2–4.9)
T3 Uptake Ratio: 23 % — ABNORMAL LOW (ref 24–39)
T4 TOTAL: 7.1 ug/dL (ref 4.5–12.0)
TSH: 2.77 u[IU]/mL (ref 0.450–4.500)

## 2017-12-21 NOTE — Telephone Encounter (Signed)
VM received [facility] wanted to verify before filling pt's alprazolam that Dr. Laurance Flatten is aware pt is on the Alprazolam, Tramadol 15mg  & Morphine written by Dr. Nicholaus Bloom. Please advise acknowledgement to [facility] with reference #[id number]

## 2017-12-21 NOTE — Telephone Encounter (Signed)
Held for [facility] for approx 10 mins - will try again on Monday

## 2017-12-23 NOTE — Telephone Encounter (Signed)
Please let me know if I need to do anything else to make this prescription go through.

## 2017-12-24 NOTE — Telephone Encounter (Signed)
[facility] pharmacist notified Dr Laurance Flatten is aware pt is on these medications

## 2017-12-27 DIAGNOSIS — N760 Acute vaginitis: Secondary | ICD-10-CM | POA: Diagnosis not present

## 2017-12-27 DIAGNOSIS — Z681 Body mass index (BMI) 19 or less, adult: Secondary | ICD-10-CM | POA: Diagnosis not present

## 2017-12-27 DIAGNOSIS — Z01419 Encounter for gynecological examination (general) (routine) without abnormal findings: Secondary | ICD-10-CM | POA: Diagnosis not present

## 2017-12-28 ENCOUNTER — Encounter: Payer: Self-pay | Admitting: Cardiology

## 2017-12-28 ENCOUNTER — Encounter: Payer: Self-pay | Admitting: Family Medicine

## 2017-12-31 ENCOUNTER — Other Ambulatory Visit: Payer: Self-pay | Admitting: *Deleted

## 2017-12-31 MED ORDER — ONDANSETRON HCL 4 MG PO TABS: 4.0000 mg | ORAL_TABLET | Freq: Three times a day (TID) | ORAL | 1 refills | Status: DC | PRN

## 2018-01-02 ENCOUNTER — Telehealth: Payer: Self-pay | Admitting: *Deleted

## 2018-01-02 MED ORDER — NONFORMULARY OR COMPOUNDED ITEM: 1.0000 g | Freq: Every day | 11 refills | Status: DC

## 2018-01-02 NOTE — Telephone Encounter (Signed)
Refill request for [employer] onychomycosis nail lacquer. Dr. Paulla Dolly ordered refill +11. Return rx.

## 2018-01-03 DIAGNOSIS — M47812 Spondylosis without myelopathy or radiculopathy, cervical region: Secondary | ICD-10-CM | POA: Diagnosis not present

## 2018-01-03 DIAGNOSIS — G894 Chronic pain syndrome: Secondary | ICD-10-CM | POA: Diagnosis not present

## 2018-01-03 DIAGNOSIS — M797 Fibromyalgia: Secondary | ICD-10-CM | POA: Diagnosis not present

## 2018-01-03 DIAGNOSIS — Z79891 Long term (current) use of opiate analgesic: Secondary | ICD-10-CM | POA: Diagnosis not present

## 2018-03-04 DIAGNOSIS — G894 Chronic pain syndrome: Secondary | ICD-10-CM | POA: Diagnosis not present

## 2018-03-04 DIAGNOSIS — M797 Fibromyalgia: Secondary | ICD-10-CM | POA: Diagnosis not present

## 2018-03-04 DIAGNOSIS — M47812 Spondylosis without myelopathy or radiculopathy, cervical region: Secondary | ICD-10-CM | POA: Diagnosis not present

## 2018-03-04 DIAGNOSIS — Z79891 Long term (current) use of opiate analgesic: Secondary | ICD-10-CM | POA: Diagnosis not present

## 2018-05-02 DIAGNOSIS — G894 Chronic pain syndrome: Secondary | ICD-10-CM | POA: Diagnosis not present

## 2018-05-02 DIAGNOSIS — M47812 Spondylosis without myelopathy or radiculopathy, cervical region: Secondary | ICD-10-CM | POA: Diagnosis not present

## 2018-05-02 DIAGNOSIS — M797 Fibromyalgia: Secondary | ICD-10-CM | POA: Diagnosis not present

## 2018-05-02 DIAGNOSIS — Z79891 Long term (current) use of opiate analgesic: Secondary | ICD-10-CM | POA: Diagnosis not present

## 2018-06-26 ENCOUNTER — Ambulatory Visit: Payer: BLUE CROSS/BLUE SHIELD | Admitting: Family Medicine

## 2018-07-04 DIAGNOSIS — G894 Chronic pain syndrome: Secondary | ICD-10-CM | POA: Diagnosis not present

## 2018-07-04 DIAGNOSIS — M797 Fibromyalgia: Secondary | ICD-10-CM | POA: Diagnosis not present

## 2018-07-04 DIAGNOSIS — Z79891 Long term (current) use of opiate analgesic: Secondary | ICD-10-CM | POA: Diagnosis not present

## 2018-07-04 DIAGNOSIS — M47812 Spondylosis without myelopathy or radiculopathy, cervical region: Secondary | ICD-10-CM | POA: Diagnosis not present

## 2018-07-08 ENCOUNTER — Ambulatory Visit: Payer: BLUE CROSS/BLUE SHIELD | Admitting: Family Medicine

## 2018-07-22 ENCOUNTER — Ambulatory Visit: Payer: BLUE CROSS/BLUE SHIELD | Admitting: Family Medicine

## 2018-07-22 ENCOUNTER — Encounter: Payer: Self-pay | Admitting: Family Medicine

## 2018-07-22 VITALS — BP 121/73 | HR 95 | Temp 98.3°F | Ht 65.0 in | Wt 110.0 lb

## 2018-07-22 DIAGNOSIS — M797 Fibromyalgia: Secondary | ICD-10-CM | POA: Diagnosis not present

## 2018-07-22 DIAGNOSIS — E559 Vitamin D deficiency, unspecified: Secondary | ICD-10-CM

## 2018-07-22 DIAGNOSIS — E7800 Pure hypercholesterolemia, unspecified: Secondary | ICD-10-CM | POA: Diagnosis not present

## 2018-07-22 DIAGNOSIS — E039 Hypothyroidism, unspecified: Secondary | ICD-10-CM | POA: Diagnosis not present

## 2018-07-22 DIAGNOSIS — Z8249 Family history of ischemic heart disease and other diseases of the circulatory system: Secondary | ICD-10-CM

## 2018-07-22 DIAGNOSIS — K219 Gastro-esophageal reflux disease without esophagitis: Secondary | ICD-10-CM

## 2018-07-22 DIAGNOSIS — J301 Allergic rhinitis due to pollen: Secondary | ICD-10-CM

## 2018-07-22 DIAGNOSIS — Z800 Family history of malignant neoplasm of digestive organs: Secondary | ICD-10-CM

## 2018-07-22 MED ORDER — ALPRAZOLAM 0.5 MG PO TABS: 0.5000 mg | ORAL_TABLET | Freq: Every evening | ORAL | 1 refills | Status: DC | PRN

## 2018-07-22 MED ORDER — CARISOPRODOL 350 MG PO TABS: 350.0000 mg | ORAL_TABLET | Freq: Two times a day (BID) | ORAL | 2 refills | Status: DC | PRN

## 2018-07-22 MED ORDER — ISOMETHEPTENE-DICHLORAL-APAP 65-100-325 MG PO CAPS: 1.0000 | ORAL_CAPSULE | Freq: Four times a day (QID) | ORAL | 2 refills | Status: DC | PRN

## 2018-07-22 MED ORDER — PAREGORIC 2 MG/5ML PO TINC: ORAL | 2 refills | Status: DC

## 2018-07-22 NOTE — Patient Instructions (Addendum)
Continue current medications. Continue good therapeutic lifestyle changes which include good diet and exercise. Fall precautions discussed with patient. If an FOBT was given today- please return it to our front desk. If you are over 62 years old - you may need Prevnar 68 or the adult Pneumonia vaccine.  **Flu shots are available--- please call and schedule a FLU-CLINIC appointment**  After your visit with [place] today you will receive a survey in the mail or online from [employer] regarding your care with [place]. Please take a moment to fill this out. Your feedback is very important to [place] as you can help [place] better understand your patient needs as well as improve your experience and satisfaction. WE CARE ABOUT YOU!!!   Do not forget to follow-up and see the gastroenterologist and cardiologist Drink plenty of fluids and stay well hydrated  See dermatologist yearly

## 2018-07-22 NOTE — Progress Notes (Signed)
Subjective:    Patient ID: Wanda Robertson, female    DOB: May 25, 1956, 62 y.o.   MRN: [id number]  HPI Pt here for follow up and management of chronic medical problems which includes hyperlipidemia. She is taking medication regularly.the patient has had a recent round of prednisone whichupset her stomach. She only took this for 3 days. She wants to discuss taking the thyroid medicine. She is due to get a chest x-ray but wants to wait on this. She'll be given an FOBT to return and will get lab work today and her mammogram is scheduled for the 18th of this month. She is requesting refills on several of her medicines.urrently taking levothyroxine 50 g daily.the patient has upcoming appointments with gastroenterology and cardiology.oday she denies any chest pain pressure tightness shortness of breath or palpitations. She has had some gastritis following taking some prednisone and this seems to be getting better as ss taking some Zegerid currently. She denies any change in bowel habits or blood in the stool or black tarry bowel movements. She is passing her water without problems. Shehas had her Pap smear and has a mammogram scheduled this month.    Patient Active Problem List   Diagnosis Date Noted  . Family history of early CAD 09/17/2017  . Abnormal EKG 09/17/2017  . Abnormal cardiac CT angiography 09/17/2017  . Osteopenia   . IBS (irritable bowel syndrome)   . Chronic neck pain   . Chronic back pain   . 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 Family history of heart disease 02/25/2014  . Raynaud's disease 02/25/2014  . Hyperlipemia 05/15/2013  . GERD (gastroesophageal reflux disease) 05/15/2013  . Fibromyalgia 05/15/2013  . Cervicalgia 05/15/2013  . Osteoporosis 05/15/2013  . Hypothyroidism 05/15/2013  . Post menopausal syndrome 08/23/2011  . Post-menopause on HRT (hormone replacement therapy) 08/23/2011   Outpatient Encounter Medications as of 07/22/2018  Medication Sig  . ALPRAZolam  (XANAX) 0.5 MG tablet Take 1 tablet (0.5 mg total) by mouth at bedtime as needed.  . Biotin 1000 MCG tablet Take 1,000 mcg by mouth 3 (three) times daily.  . Calcium Carbonate-Vitamin D (CALCIUM PLUS VITAMIN D) 300-100 MG-UNIT CAPS Take 4 tablets by mouth daily.  . carisoprodol (SOMA) 350 MG tablet Take 1 tablet (350 mg total) by mouth 2 (two) times daily as needed.  . clobetasol (TEMOVATE) 0.05 % GEL Apply to affected area BID PRN  . Coenzyme Q10 (CO Q 10 PO) Take by mouth.  . Cyanocobalamin (VITAMIN B 12 PO) Take by mouth.  . fish oil-omega-3 fatty acids 1000 MG capsule Take 2 g by mouth daily.  . Flaxseed, Linseed, (FLAX SEED OIL PO) Take by mouth.  . isometheptene-acetaminophen-dichloralphenazone (MIDRIN) 65-100-325 MG capsule Take 1 capsule by mouth 4 (four) times daily as needed for migraine. Maximum 5 capsules in 12 hours for migraine headaches, 8 capsules in 24 hours for tension headaches.  Marland Kitchen KRILL OIL PO Take by mouth.  . levothyroxine (SYNTHROID) 50 MCG tablet Take 1 tablet (50 mcg total) by mouth daily.  Marland Kitchen morphine (KADIAN) 80 MG 24 hr capsule Take 80 mg by mouth 2 (two) times daily.  . Multiple Vitamins-Minerals (CENTRUM SILVER PO) Take 1 tablet by mouth daily.  . ondansetron (ZOFRAN) 4 MG tablet Take 1 tablet (4 mg total) by mouth every 8 (eight) hours as needed for nausea or vomiting.  . paregoric 2 MG/5ML solution Take 1 teaspoon by mouth every 8-12 hours as needed for severe diarrhea  . traMADol (ULTRAM)  50 MG tablet Take 50 mg by mouth 2 (two) times daily.  Marland Kitchen UNABLE TO FIND Med Name: NitroBID cream - from rheumotology - topical for hands  . zolpidem (AMBIEN CR) 12.5 MG CR tablet Take 1 tablet (12.5 mg total) by mouth at bedtime as needed for sleep.  . [DISCONTINUED] aspirin EC 81 MG tablet Take 1 tablet (81 mg total) by mouth daily.  . [DISCONTINUED] NONFORMULARY OR COMPOUNDED ITEM Apply 1-2 g topically daily. [employer] Nail lacquer: Fluconazole 2%, Terbinafine 1%, DMSO  .  [DISCONTINUED] Terbinafine HCl (LAMISIL PO) Take 1 tablet by mouth daily.   No facility-administered encounter medications on file as of 07/22/2018.      Review of Systems  Constitutional: Negative.   HENT: Negative.   Eyes: Negative.   Respiratory: Negative.   Cardiovascular: Negative.   Gastrointestinal: Negative.        Burning pain - getting better (been on prednisone)  Endocrine: Negative.   Genitourinary: Negative.   Musculoskeletal: Negative.   Skin: Negative.   Allergic/Immunologic: Negative.   Neurological: Negative.   Hematological: Negative.   Psychiatric/Behavioral: Negative.        Objective:   Physical Exam  Constitutional: She is oriented to person, place, and time. She appears well-developed and well-nourished. No distress.  The patient is pleasant and doing well overall.  HENT:  Head: Normocephalic and atraumatic.  Right Ear: External ear normal.  Left Ear: External ear normal.  Mouth/Throat: Oropharynx is clear and moist.  some nasal turbinate congestion bilaterally  Eyes: Pupils are equal, round, and reactive to light. Conjunctivae and EOM are normal. Right eye exhibits no discharge. Left eye exhibits no discharge. No scleral icterus.  Patient gets eye exams regularly  Neck: Normal range of motion. Neck supple. No thyromegaly present.  No bruits thyromegaly or anterior cervical adenopathy  Cardiovascular: Normal rate, regular rhythm, normal heart sounds and intact distal pulses.  No murmur heard. The heart is regular at 84/mwith good pedal pulses bilaterally  Pulmonary/Chest: Effort normal and breath sounds normal. She has no wheezes. She has no rales.  Clear anteriorly and posteriorly  Abdominal: Soft. Bowel sounds are normal. She exhibits no mass. There is no tenderness.  No abdominal tenderness masses or organ enlargement or bruits  Musculoskeletal: Normal range of motion. She exhibits no edema.  Lymphadenopathy:    She has no cervical adenopathy.    Neurological: She is alert and oriented to person, place, and time. She has normal reflexes. No cranial nerve deficit.  Reflexes are decreased bilaterally in lower extremities  Skin: Skin is warm and dry. No rash noted.  Psychiatric: She has a normal mood and affect. Her behavior is normal. Judgment and thought content normal.  the patient's mood behavior and affect are all normal.  Nursing note and vitals reviewed.  BP 121/73 (BP Location: Left Arm)   Pulse 95   Temp 98.3 F (36.8 C) (Oral)   Ht '5\' 5"'  (1.651 m)   Wt 110 lb (49.9 kg)   BMI 18.30 kg/m         Assessment & Plan:  1. Vitamin D deficiency -continue with vitamin D replacement pending results of lab work along with at least 600 mg of calcium twice daily - CBC with Differential/Platelet - VITAMIN D 25 Hydroxy (Vit-D Deficiency, Fractures)  2. Pure hypercholesterolemia -continue with over-the-counter cholesterol treatment pending results of lab work - BMP8+EGFR - CBC with Differential/Platelet - Lipid panel  3. Gastroesophageal reflux disease, esophagitis presence not specified -watch diet  closely and continue with current treatment - CBC with Differential/Platelet - Hepatic function panel  4. Fibromyalgia -continue current treatment - CBC with Differential/Platelet  5. Family history of heart disease -follow-up with cardiology as planned - CBC with Differential/Platelet  6. Hypothyroidism, unspecified type -continue current treatment of Synthroid which basically is one half of a 50 g by mouth daily. - Thyroid Panel With TSH  7. Family history of colon cancer -schedule visit with Dr. Elmo Putt as discussed  8. Seasonal allergic rhinitis due to pollen Use nasal saline  Meds ordered this encounter  Medications  . ALPRAZolam (XANAX) 0.5 MG tablet    Sig: Take 1 tablet (0.5 mg total) by mouth at bedtime as needed.    Dispense:  90 tablet    Refill:  1  . carisoprodol (SOMA) 350 MG tablet    Sig: Take  1 tablet (350 mg total) by mouth 2 (two) times daily as needed.    Dispense:  180 tablet    Refill:  2  . isometheptene-acetaminophen-dichloralphenazone (MIDRIN) 65-100-325 MG capsule    Sig: Take 1 capsule by mouth 4 (four) times daily as needed for migraine. Maximum 5 capsules in 12 hours for migraine headaches, 8 capsules in 24 hours for tension headaches.    Dispense:  30 capsule    Refill:  2  . paregoric 2 MG/5ML solution    Sig: Take 1 teaspoon by mouth every 8-12 hours as needed for severe diarrhea    Dispense:  240 mL    Refill:  2   Patient Instructions  Continue current medications. Continue good therapeutic lifestyle changes which include good diet and exercise. Fall precautions discussed with patient. If an FOBT was given today- please return it to our front desk. If you are over 12 years old - you may need Prevnar 10 or the adult Pneumonia vaccine.  **Flu shots are available--- please call and schedule a FLU-CLINIC appointment**  After your visit with [place] today you will receive a survey in the mail or online from [employer] regarding your care with [place]. Please take a moment to fill this out. Your feedback is very important to [place] as you can help [place] better understand your patient needs as well as improve your experience and satisfaction. WE CARE ABOUT YOU!!!   Do not forget to follow-up and see the gastroenterologist and cardiologist Drink plenty of fluids and stay well hydrated  See dermatologist yearly   Arrie Senate MD

## 2018-07-23 ENCOUNTER — Telehealth: Payer: Self-pay | Admitting: *Deleted

## 2018-07-23 DIAGNOSIS — E7800 Pure hypercholesterolemia, unspecified: Secondary | ICD-10-CM

## 2018-07-23 DIAGNOSIS — R931 Abnormal findings on diagnostic imaging of heart and coronary circulation: Secondary | ICD-10-CM

## 2018-07-23 DIAGNOSIS — Z8249 Family history of ischemic heart disease and other diseases of the circulatory system: Secondary | ICD-10-CM

## 2018-07-23 LAB — HEPATIC FUNCTION PANEL
ALT: 16 IU/L (ref 0–32)
AST: 19 IU/L (ref 0–40)
Albumin: 4.5 g/dL (ref 3.6–4.8)
Alkaline Phosphatase: 52 IU/L (ref 39–117)
Bilirubin Total: 0.3 mg/dL (ref 0.0–1.2)
Bilirubin, Direct: 0.09 mg/dL (ref 0.00–0.40)
Total Protein: 6.9 g/dL (ref 6.0–8.5)

## 2018-07-23 LAB — THYROID PANEL WITH TSH
Free Thyroxine Index: 1.8 (ref 1.2–4.9)
T3 UPTAKE RATIO: 24 % (ref 24–39)
T4 TOTAL: 7.6 ug/dL (ref 4.5–12.0)
TSH: 0.978 u[IU]/mL (ref 0.450–4.500)

## 2018-07-23 LAB — BMP8+EGFR
BUN / CREAT RATIO: 16 (ref 12–28)
BUN: 10 mg/dL (ref 8–27)
CHLORIDE: 102 mmol/L (ref 96–106)
CO2: 26 mmol/L (ref 20–29)
Calcium: 9.5 mg/dL (ref 8.7–10.3)
Creatinine, Ser: 0.61 mg/dL (ref 0.57–1.00)
GFR calc Af Amer: 113 mL/min/{1.73_m2} (ref >59)
GFR calc non Af Amer: 98 mL/min/{1.73_m2} (ref >59)
GLUCOSE: 78 mg/dL (ref 65–99)
Potassium: 4.6 mmol/L (ref 3.5–5.2)
SODIUM: 139 mmol/L (ref 134–144)

## 2018-07-23 LAB — LIPID PANEL
CHOL/HDL RATIO: 2.6 ratio (ref 0.0–4.4)
CHOLESTEROL TOTAL: 158 mg/dL (ref 100–199)
HDL: 61 mg/dL (ref >39)
LDL Calculated: 85 mg/dL (ref 0–99)
TRIGLYCERIDES: 58 mg/dL (ref 0–149)
VLDL Cholesterol Cal: 12 mg/dL (ref 5–40)

## 2018-07-23 LAB — CBC WITH DIFFERENTIAL/PLATELET
BASOS ABS: 0 10*3/uL (ref 0.0–0.2)
Basos: 1 %
EOS (ABSOLUTE): 0 10*3/uL (ref 0.0–0.4)
Eos: 1 %
HEMATOCRIT: 40.6 % (ref 34.0–46.6)
Hemoglobin: 13.6 g/dL (ref 11.1–15.9)
Immature Grans (Abs): 0 10*3/uL (ref 0.0–0.1)
Immature Granulocytes: 0 %
Lymphocytes Absolute: 1.4 10*3/uL (ref 0.7–3.1)
Lymphs: 26 %
MCH: 31.1 pg (ref 26.6–33.0)
MCHC: 33.5 g/dL (ref 31.5–35.7)
MCV: 93 fL (ref 79–97)
MONOS ABS: 0.5 10*3/uL (ref 0.1–0.9)
Monocytes: 9 %
Neutrophils Absolute: 3.4 10*3/uL (ref 1.4–7.0)
Neutrophils: 63 %
Platelets: 264 10*3/uL (ref 150–450)
RBC: 4.37 x10E6/uL (ref 3.77–5.28)
RDW: 12.2 % — AB (ref 12.3–15.4)
WBC: 5.3 10*3/uL (ref 3.4–10.8)

## 2018-07-23 LAB — VITAMIN D 25 HYDROXY (VIT D DEFICIENCY, FRACTURES): Vit D, 25-Hydroxy: 48.7 ng/mL (ref 30.0–100.0)

## 2018-07-23 NOTE — Telephone Encounter (Deleted)
-----   Message from Zannie Cove, LPN sent at 79/02/5830  3:11 PM EDT ----- It would be more convenient for pt to go to [place] = I think she lives closer to there - thank you

## 2018-07-23 NOTE — Telephone Encounter (Signed)
Sueanne Margarita, MD  Chipper Herb, MD; Nuala Alpha, LPN        Please let patient know that with her coronary Ca her LDL goal is < 70. Please encourage her to exercise 45 minutes 5 days weekly along with decreasing sugars, carbs and fats. Repeat FLP in 4 months   Associated Results   Result Notes for BMP8+EGFR   Notes recorded by Karle Plumber, RMA on 07/23/2018 at 3:21 PM EDT lmtcb ------  Notes recorded by Bullins, Cleda Clarks, LPN on 94/10/7917 at 9:57 PM EDT It would be more convenient for pt to go to [place] = I think she lives closer to there - thank you ------  Notes recorded by Chipper Herb, MD on 07/23/2018 at 2:01 PM EDT Please call patient with these recommendations from Dr. Meda Coffee based on the lab work that was done in our office. She wants the LDL-C to be less than 70 for this patient. She should have a repeat fasting lipid panel in 4 months. ------  Notes recorded by Sueanne Margarita, MD on 07/23/2018 at 1:31 PM EDT Please let patient know that with her coronary Ca her LDL goal is < 70. Please encourage her to exercise 45 minutes 5 days weekly along with decreasing sugars, carbs and fats. Repeat FLP in 4 months ------  Notes recorded by Chipper Herb, MD on 07/23/2018 at 1:27 PM EDT The vitamin D level is excellent and she should continue with current treatment All thyroid function tests are within normal limits at the current dose of thyroid medication and she should continue with her current treatment regimen All liver function tests including the direct bilirubin are within normal limits Please call the patient with these results as well as with results with recommendations that have already been reviewed ------  Notes recorded by Chipper Herb, MD on 07/23/2018 at 7:25 AM EDT Blood sugar is good at 78. The creatinine, the most important kidney function test is within normal limits. All of the electrolytes including potassium are good. The CBC has a  normal white blood cell count. Hemoglobin is good at 13.6 and the platelet count is adequate. All cholesterol numbers were traditional lipid testing are good and at goal with the bad cholesterol LDL see cholesterol being good at 85. The good cholesterol remains excellent at 61 and triglycerides are good at 58.----- the patient should continue with her omega-3 fatty acids and with as aggressive therapeutic lifestyle changes as possible which include diet and exercise The vitamin D level result is still pending==== All thyroid function tests are still pending==== All liver function tests returned are within normal limits. The direct bilirubin is still pending====

## 2018-07-23 NOTE — Telephone Encounter (Signed)
Pt aware of lab results and recommendations per Dr Laurance Flatten and Dr Radford Pax (covering for Dr Meda Coffee).  Pt education provided on exercise and diet regimen. Scheduled the pt her 4 month fasting lipid appt for 11/25/2018.  Pt aware to come fasting.  Scheduled the pts 1 year follow-up appt with Dr Meda Coffee, for 10/28/2018 at 2:20 pm, as pt requested this appt date.  Pt is aware to arrive 15 mins prior to that appt.  Pt verbalized understanding and agreed with this plan.

## 2018-08-28 ENCOUNTER — Telehealth: Payer: Self-pay | Admitting: *Deleted

## 2018-08-28 NOTE — Telephone Encounter (Signed)
Fax from [facility] Isometh/D-Chloralph/Apap cap is discontinued. Midrin, Epidrin, Amidrine, Migrazone, Migratine and Duradrin are all discontinued  Please advise on alternative

## 2018-08-28 NOTE — Telephone Encounter (Signed)
Please check with Sharyn Lull about a good alternative

## 2018-08-29 NOTE — Telephone Encounter (Signed)
Discuss with local pharmacy about other options for this medication since Wanda Robertson will not be back for 1 week.  I did ask her to look at this yesterday but I guess she forgot.

## 2018-08-30 NOTE — Telephone Encounter (Signed)
LM for pt - that Sharyn Lull will review on WED

## 2018-09-03 ENCOUNTER — Telehealth: Payer: Self-pay | Admitting: Family Medicine

## 2018-09-03 NOTE — Telephone Encounter (Signed)
Aware - this is waiting on Sharyn Lull to review tomorrow

## 2018-09-03 NOTE — Telephone Encounter (Signed)
Spoke to [place] (pharamsist with [facility]) and she states that Midrin has been d/c.  States that the brand and generic has been d/c and other other medications with the same ingredients.  Please send in something else for patient.

## 2018-09-11 DIAGNOSIS — M797 Fibromyalgia: Secondary | ICD-10-CM | POA: Diagnosis not present

## 2018-09-11 DIAGNOSIS — M47812 Spondylosis without myelopathy or radiculopathy, cervical region: Secondary | ICD-10-CM | POA: Diagnosis not present

## 2018-09-11 DIAGNOSIS — Z79891 Long term (current) use of opiate analgesic: Secondary | ICD-10-CM | POA: Diagnosis not present

## 2018-09-11 DIAGNOSIS — G894 Chronic pain syndrome: Secondary | ICD-10-CM | POA: Diagnosis not present

## 2018-09-13 ENCOUNTER — Telehealth: Payer: Self-pay | Admitting: Family Medicine

## 2018-09-14 ENCOUNTER — Encounter: Payer: Self-pay | Admitting: Family Medicine

## 2018-09-14 NOTE — Telephone Encounter (Signed)
Please address

## 2018-09-16 NOTE — Telephone Encounter (Signed)
Spoke to pt this morning (see phone encounter) this has been resolved.

## 2018-09-16 NOTE — Telephone Encounter (Signed)
Spoke with pt and she states the nurse at pain management clinic and they are taking care of it.

## 2018-09-16 NOTE — Telephone Encounter (Signed)
Please make sure that this additional medicine is refilled as long as it fits are computer records.  Based on the note that I received I am not sure what pain medicine that she is wanting refilled.  Let patient know that this is been done or will be done.

## 2018-10-28 ENCOUNTER — Ambulatory Visit: Payer: BLUE CROSS/BLUE SHIELD | Admitting: Cardiology

## 2018-10-28 ENCOUNTER — Encounter: Payer: Self-pay | Admitting: Cardiology

## 2018-10-28 VITALS — BP 106/70 | HR 89 | Ht 65.0 in | Wt 113.4 lb

## 2018-10-28 DIAGNOSIS — Z8249 Family history of ischemic heart disease and other diseases of the circulatory system: Secondary | ICD-10-CM

## 2018-10-28 DIAGNOSIS — R931 Abnormal findings on diagnostic imaging of heart and coronary circulation: Secondary | ICD-10-CM

## 2018-10-28 DIAGNOSIS — I491 Atrial premature depolarization: Secondary | ICD-10-CM

## 2018-10-28 NOTE — Patient Instructions (Signed)
Medication Instructions:   Your physician recommends that you continue on your current medications as directed. Please refer to the Current Medication list given to you today.  If you need a refill on your cardiac medications before your next appointment, please call your pharmacy.     Follow-Up:  Your physician wants you to follow-up in: [place] will receive a reminder letter in the mail two months in advance. If you don't receive a letter, please call our office to schedule the follow-up appointment.

## 2018-10-28 NOTE — Progress Notes (Signed)
Patient Name: Wanda Robertson Date of Encounter: 10/28/2018  Primary Care Provider:  Chipper Herb, MD Primary Cardiologist:  Dr Aundra Dubin --> Dr Meda Coffee  Problem List   Past Medical History:  Diagnosis Date  . Chronic back pain   . Chronic neck pain   . Family history of heart disease 02/25/2014  . Fibromyalgia 05/15/2013  . GERD (gastroesophageal reflux disease) 05/15/2013  . Hyperlipemia 05/15/2013  . Hypothyroidism 05/15/2013  . IBS (irritable bowel syndrome)   . Osteopenia   . Osteoporosis 05/15/2013   History of past treatment with Forteo   . Raynaud's disease 02/25/2014   Past Surgical History:  Procedure Laterality Date  . TONSILLECTOMY      Allergies  Allergies  Allergen Reactions  . Compazine [Prochlorperazine Edisylate] Other (See Comments)    Severe muscle requiring ER visit  . Penicillins Hives  . Prochlorperazine   . Sulfamethoxazole-Trimethoprim   . Sulfa Antibiotics Rash   HPI  63 year old female previously followed by Dr Aundra Dubin as a preventive cardiology. She is a younger appearing female with very healthy weight and diet - vegan for the last 20 years and moderate amount of exercise including yoga. She denies any symptoms of chest pain, SOB, LE edema, orthopnea, PND.  She has a very significant FH of early CAD, her mother had MI in her 31' and died of another MI in early 74'. Her brother had a stent placed at age 26, still living in his 4'.  She had a calcium score done in 2015 that was 20.25 Agatston units. This was 78th percentile for age and sex matched control. She is not on statin.   10/28/2018 -this is 1 year follow-up, the patient has been doing well, she walks daily and performs yoga as well.  She denies any chest pain shortness of breath, no palpitation claudication no dizziness or syncope.  She has been using 500 mg of red yeast rice a day.  Home Medications  Prior to Admission medications   Medication Sig Start Date End Date Taking?  Authorizing Provider  ALPRAZolam Duanne Moron) 0.5 MG tablet Take 1 tablet (0.5 mg total) by mouth at bedtime as needed. 06/21/17  Yes Chipper Herb, MD  aspirin EC 81 MG tablet Take 1 tablet (81 mg total) by mouth daily. 08/21/14  Yes Larey Dresser, MD  Biotin 1000 MCG tablet Take 1,000 mcg by mouth 3 (three) times daily.   Yes [provider]  Calcium Carbonate-Vitamin D (CALCIUM PLUS VITAMIN D) 300-100 MG-UNIT CAPS Take 4 tablets by mouth daily.   Yes [provider]  carisoprodol (SOMA) 350 MG tablet Take 1 tablet (350 mg total) by mouth 2 (two) times daily as needed. 06/21/17  Yes Chipper Herb, MD  clobetasol (TEMOVATE) 0.05 % GEL Apply to affected area BID PRN 05/24/15  Yes Chipper Herb, MD  Coenzyme Q10 (CO Q 10 PO) Take by mouth.   Yes [provider]  Cyanocobalamin (VITAMIN B 12 PO) Take by mouth.   Yes [provider]  fish oil-omega-3 fatty acids 1000 MG capsule Take 2 g by mouth daily.   Yes [provider]  Flaxseed, Linseed, (FLAX SEED OIL PO) Take by mouth.   Yes [provider]  isometheptene-acetaminophen-dichloralphenazone (MIDRIN) 65-100-325 MG capsule Take 1 capsule by mouth 4 (four) times daily as needed for migraine. Maximum 5 capsules in 12 hours for migraine headaches, 8 capsules in 24 hours for tension headaches. 01/04/17  Yes Laurance Flatten,  Estella Husk, MD  KRILL OIL PO Take by mouth.   Yes [provider]  levothyroxine (SYNTHROID) 50 MCG tablet Take 1 tablet (50 mcg total) by mouth daily. 06/12/16  Yes Chipper Herb, MD  morphine (KADIAN) 80 MG 24 hr capsule Take 80 mg by mouth 2 (two) times daily.   Yes [provider]  Multiple Vitamins-Minerals (CENTRUM SILVER PO) Take 1 tablet by mouth daily.   Yes [provider]  NONFORMULARY OR COMPOUNDED ITEM Apply 1-2 g topically daily. [employer] N[employer] lacquer: Fluconazole 2%, Terbinafine 1%, DMSO 11/30/16  Yes Regal, Tamala Fothergill, DPM  paregoric 2 MG/5ML solution Take  1 teaspoon by mouth every 8-12 hours as needed for severe diarrhea 06/12/16  Yes Chipper Herb, MD  Terbinafine HCl (LAMISIL PO) Take 1 tablet by mouth daily.   Yes [provider]  traMADol (ULTRAM) 50 MG tablet Take 50 mg by mouth 2 (two) times daily.   Yes [provider]  UNABLE TO FIND Med Name: NitroBID cream - from rheumotology - topical for hands   Yes [provider]  zolpidem (AMBIEN CR) 12.5 MG CR tablet Take 1 tablet (12.5 mg total) by mouth at bedtime as needed for sleep. 06/21/17  Yes Chipper Herb, MD   Family History  Family History  Problem Relation Age of Onset  . Heart disease Mother   . Heart disease Father   . Heart disease Unknown        family history   . Colon cancer Unknown        family history    Social History  Social History   Socioeconomic History  . Marital status: Married    Spouse name: Not on file  . Number of children: Not on file  . Years of education: Not on file  . Highest education level: Not on file  Occupational History  . Not on file  Social Needs  . Financial resource strain: Not on file  . Food insecurity:    Worry: Not on file    Inability: Not on file  . Transportation needs:    Medical: Not on file    Non-medical: Not on file  Tobacco Use  . Smoking status: Never Smoker  . Smokeless tobacco: Never Used  Substance and Sexual Activity  . Alcohol use: No  . Drug use: No  . Sexual activity: Not on file  Lifestyle  . Physical activity:    Days per week: Not on file    Minutes per session: Not on file  . Stress: Not on file  Relationships  . Social connections:    Talks on phone: Not on file    Gets together: Not on file    Attends religious service: Not on file    Active member of club or organization: Not on file    Attends meetings of clubs or organizations: Not on file    Relationship status: Not on file  . Intimate partner violence:    Fear of current or ex partner: Not on file     Emotionally abused: Not on file    Physically abused: Not on file    Forced sexual activity: Not on file  Other Topics Concern  . Not on file  Social History Narrative  . Not on file    Review of Systems, as per HPI, otherwise negative General:  No chills, fever, night sweats or weight changes.  Cardiovascular:  No chest pain, dyspnea on exertion, edema, orthopnea,  palpitations, paroxysmal nocturnal dyspnea. Dermatological: No rash, lesions/masses Respiratory: No cough, dyspnea Urologic: No hematuria, dysuria Abdominal:   No nausea, vomiting, diarrhea, bright red blood per rectum, melena, or hematemesis Neurologic:  No visual changes, wkns, changes in mental status. All other systems reviewed and are otherwise negative except as noted above.  Physical Exam  Blood pressure 106/70, pulse 89, height 5\' 5"  (1.651 m), weight 113 lb 6.4 oz (51.4 kg).  General: Pleasant, NAD Psych: Normal affect. Neuro: Alert and oriented X 3. Moves all extremities spontaneously. HEENT: Normal  Neck: Supple without bruits or JVD. Lungs:  Resp regular and unlabored, CTA. Heart: RRR no s3, s4, or murmurs. Abdomen: Soft, non-tender, non-distended, BS + x 4.  Extremities: No clubbing, cyanosis or edema. DP/PT/Radials 2+ and equal bilaterally.  Labs:  No results for input(s): CKTOTAL, CKMB, TROPONINI in the last 72 hours. Lab Results  Component Value Date   WBC 5.3 07/22/2018   HGB 13.6 07/22/2018   HCT 40.6 07/22/2018   MCV 93 07/22/2018   PLT 264 07/22/2018    No results found for: DDIMER Invalid input(s): POCBNP    Component Value Date/Time   NA 139 07/22/2018 1530   K 4.6 07/22/2018 1530   CL 102 07/22/2018 1530   CO2 26 07/22/2018 1530   GLUCOSE 78 07/22/2018 1530   GLUCOSE 92 09/26/2007 0140   BUN 10 07/22/2018 1530   CREATININE 0.61 07/22/2018 1530   CALCIUM 9.5 07/22/2018 1530   PROT 6.9 07/22/2018 1530   ALBUMIN 4.5 07/22/2018 1530   AST 19 07/22/2018 1530   ALT 16 07/22/2018  1530   ALKPHOS 52 07/22/2018 1530   BILITOT 0.3 07/22/2018 1530   GFRNONAA 98 07/22/2018 1530   GFRAA 113 07/22/2018 1530   Lab Results  Component Value Date   CHOL 158 07/22/2018   HDL 61 07/22/2018   LDLCALC 85 07/22/2018   TRIG 58 07/22/2018    Accessory Clinical Findings  Echocardiogram - none  ECG -low atrial rhythm, nonspecific ST-T wave abnormalities, unchanged from prior   Assessment & Plan  1. CAD - Coronary calcium score of 20.25 Agatston units. This was 78th percentile for age and sex matched control.  She has been using red yeast rice 1200 mg daily, she has excellent lipid profile.  She is very active and is eating vegan diet.  We will repeat calcium score next year.  2. Family history of early CAD -as above.  3.  Atrial rhythm -unchanged from prior, this completely asymptomatic.  Ena Dawley, MD, [facility] 10/28/2018, 3:03 PM

## 2018-11-07 DIAGNOSIS — M47812 Spondylosis without myelopathy or radiculopathy, cervical region: Secondary | ICD-10-CM | POA: Diagnosis not present

## 2018-11-07 DIAGNOSIS — M797 Fibromyalgia: Secondary | ICD-10-CM | POA: Diagnosis not present

## 2018-11-07 DIAGNOSIS — G894 Chronic pain syndrome: Secondary | ICD-10-CM | POA: Diagnosis not present

## 2018-11-07 DIAGNOSIS — Z79891 Long term (current) use of opiate analgesic: Secondary | ICD-10-CM | POA: Diagnosis not present

## 2018-11-25 ENCOUNTER — Other Ambulatory Visit: Payer: BLUE CROSS/BLUE SHIELD

## 2019-01-02 DIAGNOSIS — Z79891 Long term (current) use of opiate analgesic: Secondary | ICD-10-CM | POA: Diagnosis not present

## 2019-01-02 DIAGNOSIS — G894 Chronic pain syndrome: Secondary | ICD-10-CM | POA: Diagnosis not present

## 2019-01-02 DIAGNOSIS — M47812 Spondylosis without myelopathy or radiculopathy, cervical region: Secondary | ICD-10-CM | POA: Diagnosis not present

## 2019-01-02 DIAGNOSIS — M797 Fibromyalgia: Secondary | ICD-10-CM | POA: Diagnosis not present

## 2019-02-03 ENCOUNTER — Ambulatory Visit (INDEPENDENT_AMBULATORY_CARE_PROVIDER_SITE_OTHER): Payer: BLUE CROSS/BLUE SHIELD | Admitting: Family Medicine

## 2019-02-03 ENCOUNTER — Other Ambulatory Visit: Payer: Self-pay

## 2019-02-03 DIAGNOSIS — Z800 Family history of malignant neoplasm of digestive organs: Secondary | ICD-10-CM

## 2019-02-03 DIAGNOSIS — M797 Fibromyalgia: Secondary | ICD-10-CM | POA: Diagnosis not present

## 2019-02-03 DIAGNOSIS — E039 Hypothyroidism, unspecified: Secondary | ICD-10-CM

## 2019-02-03 DIAGNOSIS — K219 Gastro-esophageal reflux disease without esophagitis: Secondary | ICD-10-CM

## 2019-02-03 DIAGNOSIS — Z8249 Family history of ischemic heart disease and other diseases of the circulatory system: Secondary | ICD-10-CM

## 2019-02-03 DIAGNOSIS — W57XXXA Bitten or stung by nonvenomous insect and other nonvenomous arthropods, initial encounter: Secondary | ICD-10-CM

## 2019-02-03 DIAGNOSIS — J301 Allergic rhinitis due to pollen: Secondary | ICD-10-CM

## 2019-02-03 DIAGNOSIS — E559 Vitamin D deficiency, unspecified: Secondary | ICD-10-CM | POA: Diagnosis not present

## 2019-02-03 NOTE — Addendum Note (Signed)
Addended by: Zannie Cove on: 02/03/2019 04:42 PM   Modules accepted: Orders

## 2019-02-03 NOTE — Progress Notes (Signed)
Virtual Visit Via telephone Note I connected with@ on 02/03/19 by telephone and verified that I am speaking with the correct person or authorized healthcare agent using two identifiers. Wanda Robertson is currently located at home and there are no unauthorized people in close proximity. I completed this visit while in a private location in my home office.  This visit type was conducted due to national recommendations for restrictions regarding the COVID-19 Pandemic (e.g. social distancing).  This format is felt to be most appropriate for this patient at this time.  All issues noted in this document were discussed and addressed.  No physical exam was performed.    I discussed the limitations, risks, security and privacy concerns of performing an evaluation and management service by telephone and the availability of in person appointments. I also discussed with the patient that there may be a patient responsible charge related to this service. The patient expressed understanding and agreed to proceed.   Date:  02/03/2019    ID:  Wanda Robertson      1956/08/17        [id number]   Patient Care Team Patient Care Team: Chipper Herb, MD as PCP - General (Family Medicine) Dorothy Spark, MD as PCP - Cardiology (Cardiology)  Reason for Visit: Primary Care Follow-up     History of Present Illness & Review of Systems:     Wanda Robertson is a 63 y.o. year old female primary care patient that presents today for a telehealth visit.  The patient is doing well overall and is currently not working because of COVID-19.  She understands all the implications of good pulmonary and hand hygiene.  She is not sure when she will go back to work.  She is waiting for cosmetology board to tell her that.  She denies any chest pain pressure tightness or shortness of breath.  She denies any trouble with her stomach other than irritable bowel syndrome which she has periodically.  She has had no change in  bowel habits.  She denies any trouble with swallowing heartburn or indigestion.  She is due to get her colonoscopy and will call the gastroenterologist so that he can get her on a schedule to get this done.  She promises to do this.  She is passing her water without problems.  She sees Dr. Ena Dawley, her cardiologist on a regular basis.  Her last visit was in October and she will go back again this October.  Dr. Meda Coffee was pleased with her most recent blood work and that was with taking red yeast rice and following a vegan diet.  Patient does indicate that she has had tick bites.  When she comes in for her blood work we will add a titer for [facility] spotted fever and Lyme disease.  She is also needing refills on her Ambien CR her paregoric and Soma.  [facility].  The Ambien CR and Soma go to [facility].  Hopefully she can get in and get her blood work in the next 4 to 6 weeks.  She is due to get her GYN visit and her mammogram soon.  Review of systems as stated otherwise negative for body systems left unmentioned.   The patient does not have symptoms concerning for COVID-19 infection (fever, chills, cough, or new shortness of breath).      Current Medications (Verified) Allergies as of 02/03/2019      Reactions  Compazine [prochlorperazine Edisylate] Other (See Comments)   Severe muscle requiring ER visit   Penicillins Hives   Prochlorperazine    Sulfamethoxazole-trimethoprim    Sulfa Antibiotics Rash      Medication List       Accurate as of February 03, 2019  2:02 PM. Always use your most recent med list.        ALPRAZolam 0.5 MG tablet Commonly known as:  XANAX Take 1 tablet (0.5 mg total) by mouth at bedtime as needed.   Biotin 1000 MCG tablet Take 1,000 mcg by mouth 3 (three) times daily.   Calcium Plus Vitamin D 300-100 MG-UNIT Caps Generic drug:  Calcium Carbonate-Vitamin D Take 4 tablets by mouth daily.   carisoprodol 350 MG  tablet Commonly known as:  SOMA Take 1 tablet (350 mg total) by mouth 2 (two) times daily as needed.   CENTRUM SILVER PO Take 1 tablet by mouth daily.   clobetasol 0.05 % Gel Commonly known as:  TEMOVATE Apply to affected area BID PRN   CO Q 10 PO Take by mouth.   fish oil-omega-3 fatty acids 1000 MG capsule Take 2 g by mouth daily.   FLAX SEED OIL PO Take by mouth.   isometheptene-acetaminophen-dichloralphenazone 65-100-325 MG capsule Commonly known as:  MIDRIN Take 1 capsule by mouth 4 (four) times daily as needed for migraine. Maximum 5 capsules in 12 hours for migraine headaches, 8 capsules in 24 hours for tension headaches.   KRILL OIL PO Take by mouth.   levothyroxine 50 MCG tablet Commonly known as:  Synthroid Take 1 tablet (50 mcg total) by mouth daily.   morphine 60 MG 24 hr capsule Commonly known as:  KADIAN Take 60 mg by mouth 2 (two) times daily.   ondansetron 4 MG tablet Commonly known as:  Zofran Take 1 tablet (4 mg total) by mouth every 8 (eight) hours as needed for nausea or vomiting.   paregoric 2 MG/5ML solution Take 1 teaspoon by mouth every 8-12 hours as needed for severe diarrhea   RED YEAST RICE PO Take 600 mg by mouth 2 (two) times daily.   traMADol 50 MG tablet Commonly known as:  ULTRAM Take 50 mg by mouth 2 (two) times daily.   UNABLE TO FIND Med Name: NitroBID cream - from rheumotology - topical for hands   VITAMIN B 12 PO Take by mouth.   zolpidem 12.5 MG CR tablet Commonly known as:  AMBIEN CR Take 1 tablet (12.5 mg total) by mouth at bedtime as needed for sleep.           Allergies (Verified)    Compazine [prochlorperazine edisylate]; Penicillins; Prochlorperazine; Sulfamethoxazole-trimethoprim; and Sulfa antibiotics  Past Medical History Past Medical History:  Diagnosis Date   Chronic back pain    Chronic neck pain    Family history of heart disease 02/25/2014   Fibromyalgia 05/15/2013   GERD (gastroesophageal  reflux disease) 05/15/2013   Hyperlipemia 05/15/2013   Hypothyroidism 05/15/2013   IBS (irritable bowel syndrome)    Osteopenia    Osteoporosis 05/15/2013   History of past treatment with Forteo    Raynaud's disease 02/25/2014     Past Surgical History:  Procedure Laterality Date   TONSILLECTOMY      Social History   Socioeconomic History   Marital status: Married    Spouse name: Not on file   Number of children: Not on file   Years of education: Not on file   Highest education level: Not on  file  Occupational History   Not on file  Social Needs   Financial resource strain: Not on file   Food insecurity:    Worry: Not on file    Inability: Not on file   Transportation needs:    Medical: Not on file    Non-medical: Not on file  Tobacco Use   Smoking status: Never Smoker   Smokeless tobacco: Never Used  Substance and Sexual Activity   Alcohol use: No   Drug use: No   Sexual activity: Not on file  Lifestyle   Physical activity:    Days per week: Not on file    Minutes per session: Not on file   Stress: Not on file  Relationships   Social connections:    Talks on phone: Not on file    Gets together: Not on file    Attends religious service: Not on file    Active member of club or organization: Not on file    Attends meetings of clubs or organizations: Not on file    Relationship status: Not on file  Other Topics Concern   Not on file  Social History Narrative   Not on file     Family History  Problem Relation Age of Onset   Heart disease Mother    Heart disease Father    Heart disease Unknown        family history    Colon cancer Unknown        family history       Labs/Other Tests and Data Reviewed:    Wt Readings from Last 3 Encounters:  10/28/18 113 lb 6.4 oz (51.4 kg)  07/22/18 110 lb (49.9 kg)  12/20/17 109 lb (49.4 kg)   Temp Readings from Last 3 Encounters:  07/22/18 98.3 F (36.8 C) (Oral)  12/20/17 98.3 F  (36.8 C) (Oral)  06/21/17 98 F (36.7 C) (Oral)   BP Readings from Last 3 Encounters:  10/28/18 106/70  07/22/18 121/73  12/20/17 127/77   Pulse Readings from Last 3 Encounters:  10/28/18 89  07/22/18 95  12/20/17 98     No results found for: HGBA1C Lab Results  Component Value Date   LDLCALC 85 07/22/2018   CREATININE 0.61 07/22/2018       Chemistry      Component Value Date/Time   NA 139 07/22/2018 1530   K 4.6 07/22/2018 1530   CL 102 07/22/2018 1530   CO2 26 07/22/2018 1530   BUN 10 07/22/2018 1530   CREATININE 0.61 07/22/2018 1530      Component Value Date/Time   CALCIUM 9.5 07/22/2018 1530   ALKPHOS 52 07/22/2018 1530   AST 19 07/22/2018 1530   ALT 16 07/22/2018 1530   BILITOT 0.3 07/22/2018 1530         OBSERVATIONS/ OBJECTIVE:     The patient was alert and had a good handle on her current medical situation.  We reviewed her medications and her problem list and everything was in agreement.  She has been at home because of COVID-19 and is not sure when the cosmetology board will allow her to return to work.  She did describe a tick bite last fall and did feel sort of sick after the tick bite and is questioning whether she could have had Lyme disease or [facility] spotted fever.  We will add these test to her lab work when she comes in to get lab work in 4 to 6 weeks.  As far as tick bites are concerned she will continue use sprays like deep [place] Hammers off her colons to help prevent this and will double clothing on her lower extremities.  The patient did have several refills which we are responsible for that included Ambien CR Soma and paregoric.  Physical exam deferred due to nature of telephonic visit.  ASSESSMENT & PLAN    Time:   Today, I have spent 28 minutes with the patient via telephone discussing the above including Covid precautions.     Visit Diagnoses: 1. Vitamin D deficiency -Continue with vitamin D pending results of lab work  2.  Gastroesophageal reflux disease, esophagitis presence not specified -No Complaints today with reflux.  3. Family history of colon cancer -Patient plans to call gastroenterologist to schedule visit for colonoscopy.  4. Fibromyalgia -Continue to follow-up pain management for this.  5. Hypothyroidism, unspecified type -Last thyroid profile in October on current dose of thyroid medicine was good.  6. Seasonal allergic rhinitis due to pollen -Continue with current treatment  7. Family history of heart disease -Follow-up with cardiology regularly in the fall  Patient Instructions  Continue to follow-up with cardiology on a yearly basis as planned Continue aggressive therapeutic lifestyle changes including current diet and continue with red yeast rice Drink plenty of water and stay well-hydrated Continue to practice good hand and respiratory hygiene and staying out of crowds of people and protecting yourself when in the public Follow-up with gastroenterology as planned     The above assessment and management plan was discussed with the patient. The patient verbalized understanding of and has agreed to the management plan. Patient is aware to call the clinic if symptoms persist or worsen. Patient is aware when to return to the clinic for a follow-up visit. Patient educated on when it is appropriate to go to the emergency department.    Chipper Herb, MD [address], [place], [place] [address] Ph [phone]   Arrie Senate MD

## 2019-02-03 NOTE — Patient Instructions (Signed)
Continue to follow-up with cardiology on a yearly basis as planned Continue aggressive therapeutic lifestyle changes including current diet and continue with red yeast rice Drink plenty of water and stay well-hydrated Continue to practice good hand and respiratory hygiene and staying out of crowds of people and protecting yourself when in the public Follow-up with gastroenterology as planned

## 2019-02-04 MED ORDER — PAREGORIC 2 MG/5ML PO TINC: ORAL | 2 refills | Status: DC

## 2019-02-04 MED ORDER — ZOLPIDEM TARTRATE ER 12.5 MG PO TBCR: 12.5000 mg | EXTENDED_RELEASE_TABLET | Freq: Every evening | ORAL | 1 refills | Status: DC | PRN

## 2019-02-04 MED ORDER — CARISOPRODOL 350 MG PO TABS: 350.0000 mg | ORAL_TABLET | Freq: Two times a day (BID) | ORAL | 2 refills | Status: DC | PRN

## 2019-02-04 NOTE — Addendum Note (Signed)
Addended by: Chipper Herb on: 02/04/2019 11:47 AM   Modules accepted: Orders

## 2019-02-24 DIAGNOSIS — Z681 Body mass index (BMI) 19 or less, adult: Secondary | ICD-10-CM | POA: Diagnosis not present

## 2019-02-24 DIAGNOSIS — Z01419 Encounter for gynecological examination (general) (routine) without abnormal findings: Secondary | ICD-10-CM | POA: Diagnosis not present

## 2019-02-24 DIAGNOSIS — Z13820 Encounter for screening for osteoporosis: Secondary | ICD-10-CM | POA: Diagnosis not present

## 2019-02-27 DIAGNOSIS — M797 Fibromyalgia: Secondary | ICD-10-CM | POA: Diagnosis not present

## 2019-02-27 DIAGNOSIS — M47812 Spondylosis without myelopathy or radiculopathy, cervical region: Secondary | ICD-10-CM | POA: Diagnosis not present

## 2019-02-27 DIAGNOSIS — G894 Chronic pain syndrome: Secondary | ICD-10-CM | POA: Diagnosis not present

## 2019-02-27 DIAGNOSIS — Z79891 Long term (current) use of opiate analgesic: Secondary | ICD-10-CM | POA: Diagnosis not present

## 2019-04-08 ENCOUNTER — Encounter: Payer: Self-pay | Admitting: Family Medicine

## 2019-04-10 ENCOUNTER — Ambulatory Visit (INDEPENDENT_AMBULATORY_CARE_PROVIDER_SITE_OTHER): Payer: BC Managed Care – PPO | Admitting: Family Medicine

## 2019-04-10 ENCOUNTER — Other Ambulatory Visit: Payer: Self-pay | Admitting: *Deleted

## 2019-04-10 ENCOUNTER — Encounter: Payer: Self-pay | Admitting: Family Medicine

## 2019-04-10 ENCOUNTER — Other Ambulatory Visit: Payer: Self-pay

## 2019-04-10 DIAGNOSIS — R931 Abnormal findings on diagnostic imaging of heart and coronary circulation: Secondary | ICD-10-CM

## 2019-04-10 DIAGNOSIS — K219 Gastro-esophageal reflux disease without esophagitis: Secondary | ICD-10-CM | POA: Diagnosis not present

## 2019-04-10 DIAGNOSIS — R933 Abnormal findings on diagnostic imaging of other parts of digestive tract: Secondary | ICD-10-CM

## 2019-04-10 DIAGNOSIS — K589 Irritable bowel syndrome without diarrhea: Secondary | ICD-10-CM

## 2019-04-10 DIAGNOSIS — E559 Vitamin D deficiency, unspecified: Secondary | ICD-10-CM

## 2019-04-10 DIAGNOSIS — E034 Atrophy of thyroid (acquired): Secondary | ICD-10-CM

## 2019-04-10 DIAGNOSIS — Z8249 Family history of ischemic heart disease and other diseases of the circulatory system: Secondary | ICD-10-CM

## 2019-04-10 DIAGNOSIS — M542 Cervicalgia: Secondary | ICD-10-CM

## 2019-04-10 DIAGNOSIS — Z800 Family history of malignant neoplasm of digestive organs: Secondary | ICD-10-CM

## 2019-04-10 DIAGNOSIS — W57XXXA Bitten or stung by nonvenomous insect and other nonvenomous arthropods, initial encounter: Secondary | ICD-10-CM

## 2019-04-10 DIAGNOSIS — M797 Fibromyalgia: Secondary | ICD-10-CM

## 2019-04-10 DIAGNOSIS — E782 Mixed hyperlipidemia: Secondary | ICD-10-CM

## 2019-04-10 DIAGNOSIS — G8929 Other chronic pain: Secondary | ICD-10-CM

## 2019-04-10 MED ORDER — ALPRAZOLAM 0.5 MG PO TABS: 0.5000 mg | ORAL_TABLET | Freq: Every evening | ORAL | 1 refills | Status: DC | PRN

## 2019-04-10 NOTE — Progress Notes (Signed)
Virtual Visit Via telephone Note I connected with@ on 04/10/19 by telephone and verified that I am speaking with the correct person or authorized healthcare agent using two identifiers. Wanda Robertson is currently located at home and there are no unauthorized people in close proximity. I completed this visit while in a private location in my home .  This visit type was conducted due to national recommendations for restrictions regarding the COVID-19 Pandemic (e.g. social distancing).  This format is felt to be most appropriate for this patient at this time.  All issues noted in this document were discussed and addressed.  No physical exam was performed.    I discussed the limitations, risks, security and privacy concerns of performing an evaluation and management service by telephone and the availability of in person appointments. I also discussed with the patient that there may be a patient responsible charge related to this service. The patient expressed understanding and agreed to proceed.   Date:  04/10/2019    ID:  Clinton Gallant      October 15, 1962        [id number]   Patient Care Team Patient Care Team: Chipper Herb, MD as PCP - General (Family Medicine) Dorothy Spark, MD as PCP - Cardiology (Cardiology)  Reason for Visit: Primary Care Follow-up     History of Present Illness & Review of Systems:     Wanda Robertson is a 63 y.o. year old female primary care patient that presents today for a telehealth visit.  Patient is doing well overall.  She is followed regularly by Dr. Nicholaus Bloom with [facility].  He handles the majority of her medications for pain management.  She has fibromyalgia and chronic cervicalgia issues.  She also has a strong family history of heart disease no family history of colon cancer.  She sees Dr. Katha Cabal yearly.  She needs to see the gastroenterologist and get her colonoscopy done and she plans to call and make this  appointment.  Today she denies any chest pain pressure tightness or shortness of breath anymore than usual.  She does have occasional nausea and GI discomfort but no more than usual.  She denies any trouble with swallowing heartburn blood in the stool or black tarry bowel movements.  She is passing her water well.  She did have a recent DEXA scan that showed osteopenia and she is very concerned about osteoporosis and my suggestion to her was to get another DEXA scan in a year and if the osteopenia worsens or becomes osteoporosis that she should consider starting Prolia at that time.  In the meantime she will continue with calcium and vitamin D and weightbearing exercise and all efforts to prevent herself from falling.  Review of systems as stated, otherwise negative.  The patient does not have symptoms concerning for COVID-19 infection (fever, chills, cough, or new shortness of breath).      Current Medications (Verified) Allergies as of 04/10/2019      Reactions   Compazine [prochlorperazine Edisylate] Other (See Comments)   Severe muscle requiring ER visit   Penicillins Hives   Prochlorperazine    Sulfamethoxazole-trimethoprim    Sulfa Antibiotics Rash      Medication List       Accurate as of April 10, 2019 10:12 AM. If you have any questions, ask your nurse or doctor.        ALPRAZolam 0.5 MG tablet Commonly known as: XANAX Take 1  tablet (0.5 mg total) by mouth at bedtime as needed.   Biotin 1000 MCG tablet Take 1,000 mcg by mouth 3 (three) times daily.   Calcium Plus Vitamin D 300-100 MG-UNIT Caps Generic drug: Calcium Carbonate-Vitamin D Take 4 tablets by mouth daily.   carisoprodol 350 MG tablet Commonly known as: SOMA Take 1 tablet (350 mg total) by mouth 2 (two) times daily as needed.   CENTRUM SILVER PO Take 1 tablet by mouth daily.   clobetasol 0.05 % Gel Commonly known as: TEMOVATE Apply to affected area BID PRN   CO Q 10 PO Take by mouth.   FLAX SEED OIL  PO Take by mouth.   KRILL OIL PO Take by mouth.   levothyroxine 50 MCG tablet Commonly known as: Synthroid Take 1 tablet (50 mcg total) by mouth daily.   morphine 60 MG 24 hr capsule Commonly known as: KADIAN Take 60 mg by mouth 2 (two) times daily.   ondansetron 4 MG tablet Commonly known as: Zofran Take 1 tablet (4 mg total) by mouth every 8 (eight) hours as needed for nausea or vomiting.   paregoric 2 MG/5ML solution Take 1 teaspoon by mouth every 8-12 hours as needed for severe diarrhea   RED YEAST RICE PO Take 600 mg by mouth 2 (two) times daily.   traMADol 50 MG tablet Commonly known as: ULTRAM Take 50 mg by mouth 2 (two) times daily.   UNABLE TO FIND Med Name: NitroBID cream - from rheumotology - topical for hands   VITAMIN B 12 PO Take by mouth.   zolpidem 12.5 MG CR tablet Commonly known as: AMBIEN CR Take 1 tablet (12.5 mg total) by mouth at bedtime as needed for sleep.           Allergies (Verified)    Compazine [prochlorperazine edisylate], Penicillins, Prochlorperazine, Sulfamethoxazole-trimethoprim, and Sulfa antibiotics  Past Medical History Past Medical History:  Diagnosis Date  . Chronic back pain   . Chronic neck pain   . Family history of heart disease 02/25/2014  . Fibromyalgia 05/15/2013  . GERD (gastroesophageal reflux disease) 05/15/2013  . Hyperlipemia 05/15/2013  . Hypothyroidism 05/15/2013  . IBS (irritable bowel syndrome)   . Osteopenia   . Osteoporosis 05/15/2013   History of past treatment with Forteo   . Raynaud's disease 02/25/2014     Past Surgical History:  Procedure Laterality Date  . TONSILLECTOMY      Social History   Socioeconomic History  . Marital status: Married    Spouse name: Not on file  . Number of children: Not on file  . Years of education: Not on file  . Highest education level: Not on file  Occupational History  . Not on file  Social Needs  . Financial resource strain: Not on file  . Food  insecurity    Worry: Not on file    Inability: Not on file  . Transportation needs    Medical: Not on file    Non-medical: Not on file  Tobacco Use  . Smoking status: Never Smoker  . Smokeless tobacco: Never Used  Substance and Sexual Activity  . Alcohol use: No  . Drug use: No  . Sexual activity: Not on file  Lifestyle  . Physical activity    Days per week: Not on file    Minutes per session: Not on file  . Stress: Not on file  Relationships  . Social connections    Talks on phone: Not on file  Gets together: Not on file    Attends religious service: Not on file    Active member of club or organization: Not on file    Attends meetings of clubs or organizations: Not on file    Relationship status: Not on file  Other Topics Concern  . Not on file  Social History Narrative  . Not on file     Family History  Problem Relation Age of Onset  . Heart disease Mother   . Heart disease Father   . Heart disease Unknown        family history   . Colon cancer Unknown        family history       Labs/Other Tests and Data Reviewed:    Wt Readings from Last 3 Encounters:  10/28/18 113 lb 6.4 oz (51.4 kg)  07/22/18 110 lb (49.9 kg)  12/20/17 109 lb (49.4 kg)   Temp Readings from Last 3 Encounters:  07/22/18 98.3 F (36.8 C) (Oral)  12/20/17 98.3 F (36.8 C) (Oral)  06/21/17 98 F (36.7 C) (Oral)   BP Readings from Last 3 Encounters:  10/28/18 106/70  07/22/18 121/73  12/20/17 127/77   Pulse Readings from Last 3 Encounters:  10/28/18 89  07/22/18 95  12/20/17 98     No results found for: HGBA1C Lab Results  Component Value Date   LDLCALC 85 07/22/2018   CREATININE 0.61 07/22/2018       Chemistry      Component Value Date/Time   NA 139 07/22/2018 1530   K 4.6 07/22/2018 1530   CL 102 07/22/2018 1530   CO2 26 07/22/2018 1530   BUN 10 07/22/2018 1530   CREATININE 0.61 07/22/2018 1530      Component Value Date/Time   CALCIUM 9.5 07/22/2018 1530    ALKPHOS 52 07/22/2018 1530   AST 19 07/22/2018 1530   ALT 16 07/22/2018 1530   BILITOT 0.3 07/22/2018 1530         OBSERVATIONS/ OBJECTIVE:     The patient says that her blood pressures have been good but these are only readings to come from pain management.  Her weight is stable at 110 pounds.  She has not been running any fever.  Her eye exams are up-to-date.  She just had a DEXA scan that showed osteopenia and we recommended as already mentioned continue with calcium and vitamin D and vitamin D replacement.  Physical exam deferred due to nature of telephonic visit.  ASSESSMENT & PLAN    Time:   Today, I have spent 25 minutes with the patient via telephone discussing the above including Covid precautions.     Visit Diagnoses: 1. Gastroesophageal reflux disease, esophagitis presence not specified -Patient has no complaints today with swallowing issues just some occasional nausea.  2. Irritable bowel syndrome, unspecified type -Continue to watch diet closely and avoid caffeine as much as possible  3. Hypothyroidism due to acquired atrophy of thyroid -Continue with levothyroxine pending results of lab work  4. Mixed hyperlipidemia -Continue with aggressive therapeutic lifestyle changes and krill oil pending results of lab work.  Also, continue with red yeast rice.  Follow-up with cardiology as planned  5. Fibromyalgia -Continue current treatment with majority of prescriptions coming from pain management and Dr. Nicholaus Bloom.  6. Family history of heart disease -Follow-up with cardiology regularly and keep cholesterol under best control possible with diet and exercise  7. Chronic neck pain -Pain medicine as needed and muscle relaxants as  needed  8. Abnormal cardiac CT angiography -Follow-up with cardiology as planned  9. Family history of colon cancer -Patient to call and schedule visit with Dr. Billie Lade for colonoscopy because of family history  10. Abnormal  colonoscopy -Schedule visit with Dr. Elmo Putt.  Patient will do this.  Patient Instructions  Would recommend repeating DEXA scan in 1 year and if bone density worsens, would recommend Prolia injections at that time Continue calcium and vitamin D Continue follow-up with cardiology because of family history and keep blood cholesterol under best control possible with red yeast rice and diet and exercise Get mammograms and pelvic exams yearly Schedule visit with gastroenterologist for colonoscopy Continue to follow-up with pain management     The above assessment and management plan was discussed with the patient. The patient verbalized understanding of and has agreed to the management plan. Patient is aware to call the clinic if symptoms persist or worsen. Patient is aware when to return to the clinic for a follow-up visit. Patient educated on when it is appropriate to go to the emergency department.    Chipper Herb, MD [facility], [place], [place] [address] Ph [phone]   Arrie Senate MD

## 2019-04-10 NOTE — Patient Instructions (Signed)
Would recommend repeating DEXA scan in 1 year and if bone density worsens, would recommend Prolia injections at that time Continue calcium and vitamin D Continue follow-up with cardiology because of family history and keep blood cholesterol under best control possible with red yeast rice and diet and exercise Get mammograms and pelvic exams yearly Schedule visit with gastroenterologist for colonoscopy Continue to follow-up with pain management

## 2019-04-10 NOTE — Addendum Note (Signed)
Addended by: Zannie Cove on: 04/10/2019 01:19 PM   Modules accepted: Orders

## 2019-04-11 ENCOUNTER — Other Ambulatory Visit: Payer: Self-pay | Admitting: *Deleted

## 2019-04-11 DIAGNOSIS — Z800 Family history of malignant neoplasm of digestive organs: Secondary | ICD-10-CM

## 2019-04-11 DIAGNOSIS — E034 Atrophy of thyroid (acquired): Secondary | ICD-10-CM

## 2019-04-11 DIAGNOSIS — E782 Mixed hyperlipidemia: Secondary | ICD-10-CM

## 2019-04-11 DIAGNOSIS — Z8249 Family history of ischemic heart disease and other diseases of the circulatory system: Secondary | ICD-10-CM

## 2019-04-11 DIAGNOSIS — E559 Vitamin D deficiency, unspecified: Secondary | ICD-10-CM

## 2019-04-11 DIAGNOSIS — K219 Gastro-esophageal reflux disease without esophagitis: Secondary | ICD-10-CM

## 2019-04-11 DIAGNOSIS — W57XXXA Bitten or stung by nonvenomous insect and other nonvenomous arthropods, initial encounter: Secondary | ICD-10-CM

## 2019-04-14 ENCOUNTER — Telehealth: Payer: Self-pay | Admitting: Family Medicine

## 2019-04-14 NOTE — Telephone Encounter (Signed)
Lab orders refaxed patient aware

## 2019-04-17 ENCOUNTER — Telehealth: Payer: Self-pay | Admitting: Family Medicine

## 2019-04-17 NOTE — Telephone Encounter (Signed)
Orders faxed

## 2019-05-01 DIAGNOSIS — M797 Fibromyalgia: Secondary | ICD-10-CM | POA: Diagnosis not present

## 2019-05-01 DIAGNOSIS — M47812 Spondylosis without myelopathy or radiculopathy, cervical region: Secondary | ICD-10-CM | POA: Diagnosis not present

## 2019-05-01 DIAGNOSIS — G894 Chronic pain syndrome: Secondary | ICD-10-CM | POA: Diagnosis not present

## 2019-05-01 DIAGNOSIS — Z79891 Long term (current) use of opiate analgesic: Secondary | ICD-10-CM | POA: Diagnosis not present

## 2019-06-26 ENCOUNTER — Encounter: Payer: Self-pay | Admitting: Family Medicine

## 2019-07-14 DIAGNOSIS — G894 Chronic pain syndrome: Secondary | ICD-10-CM | POA: Diagnosis not present

## 2019-07-14 DIAGNOSIS — Z79891 Long term (current) use of opiate analgesic: Secondary | ICD-10-CM | POA: Diagnosis not present

## 2019-07-14 DIAGNOSIS — M47812 Spondylosis without myelopathy or radiculopathy, cervical region: Secondary | ICD-10-CM | POA: Diagnosis not present

## 2019-07-14 DIAGNOSIS — M797 Fibromyalgia: Secondary | ICD-10-CM | POA: Diagnosis not present

## 2019-09-04 DIAGNOSIS — M47812 Spondylosis without myelopathy or radiculopathy, cervical region: Secondary | ICD-10-CM | POA: Diagnosis not present

## 2019-09-04 DIAGNOSIS — Z79891 Long term (current) use of opiate analgesic: Secondary | ICD-10-CM | POA: Diagnosis not present

## 2019-09-04 DIAGNOSIS — G894 Chronic pain syndrome: Secondary | ICD-10-CM | POA: Diagnosis not present

## 2019-09-04 DIAGNOSIS — M797 Fibromyalgia: Secondary | ICD-10-CM | POA: Diagnosis not present

## 2019-10-01 ENCOUNTER — Other Ambulatory Visit: Payer: Self-pay | Admitting: Family Medicine

## 2019-10-30 DIAGNOSIS — G894 Chronic pain syndrome: Secondary | ICD-10-CM | POA: Diagnosis not present

## 2019-10-30 DIAGNOSIS — Z79891 Long term (current) use of opiate analgesic: Secondary | ICD-10-CM | POA: Diagnosis not present

## 2019-10-30 DIAGNOSIS — M47812 Spondylosis without myelopathy or radiculopathy, cervical region: Secondary | ICD-10-CM | POA: Diagnosis not present

## 2019-10-30 DIAGNOSIS — M797 Fibromyalgia: Secondary | ICD-10-CM | POA: Diagnosis not present

## 2020-01-01 DIAGNOSIS — Z79891 Long term (current) use of opiate analgesic: Secondary | ICD-10-CM | POA: Diagnosis not present

## 2020-01-01 DIAGNOSIS — G894 Chronic pain syndrome: Secondary | ICD-10-CM | POA: Diagnosis not present

## 2020-01-01 DIAGNOSIS — M47812 Spondylosis without myelopathy or radiculopathy, cervical region: Secondary | ICD-10-CM | POA: Diagnosis not present

## 2020-01-01 DIAGNOSIS — M797 Fibromyalgia: Secondary | ICD-10-CM | POA: Diagnosis not present

## 2020-01-21 ENCOUNTER — Ambulatory Visit (INDEPENDENT_AMBULATORY_CARE_PROVIDER_SITE_OTHER): Payer: BC Managed Care – PPO | Admitting: Family Medicine

## 2020-01-21 ENCOUNTER — Other Ambulatory Visit: Payer: Self-pay

## 2020-01-21 ENCOUNTER — Encounter: Payer: Self-pay | Admitting: Family Medicine

## 2020-01-21 ENCOUNTER — Encounter: Payer: Self-pay | Admitting: Gastroenterology

## 2020-01-21 VITALS — BP 120/77 | HR 86 | Temp 98.1°F | Ht 65.0 in | Wt 110.0 lb

## 2020-01-21 DIAGNOSIS — E559 Vitamin D deficiency, unspecified: Secondary | ICD-10-CM

## 2020-01-21 DIAGNOSIS — E7800 Pure hypercholesterolemia, unspecified: Secondary | ICD-10-CM | POA: Diagnosis not present

## 2020-01-21 DIAGNOSIS — Z7689 Persons encountering health services in other specified circumstances: Secondary | ICD-10-CM

## 2020-01-21 DIAGNOSIS — M810 Age-related osteoporosis without current pathological fracture: Secondary | ICD-10-CM

## 2020-01-21 DIAGNOSIS — M797 Fibromyalgia: Secondary | ICD-10-CM | POA: Diagnosis not present

## 2020-01-21 DIAGNOSIS — F5101 Primary insomnia: Secondary | ICD-10-CM

## 2020-01-21 DIAGNOSIS — E034 Atrophy of thyroid (acquired): Secondary | ICD-10-CM | POA: Diagnosis not present

## 2020-01-21 DIAGNOSIS — Z79899 Other long term (current) drug therapy: Secondary | ICD-10-CM

## 2020-01-21 DIAGNOSIS — K580 Irritable bowel syndrome with diarrhea: Secondary | ICD-10-CM | POA: Diagnosis not present

## 2020-01-21 MED ORDER — ZOLPIDEM TARTRATE ER 12.5 MG PO TBCR: 12.5000 mg | EXTENDED_RELEASE_TABLET | Freq: Every evening | ORAL | 1 refills | Status: DC | PRN

## 2020-01-21 MED ORDER — CARISOPRODOL 350 MG PO TABS: 350.0000 mg | ORAL_TABLET | Freq: Two times a day (BID) | ORAL | 2 refills | Status: DC | PRN

## 2020-01-21 NOTE — Progress Notes (Signed)
Subjective: CC: Establish care, fibromyalgia, insomnia, irritable bowel syndrome/GERD PCP: Baruch Gouty, FNP PHX:[id number] Wanda Robertson is a 64 y.o. female presenting to clinic today for:  1.  Fibromyalgia Patient reports longstanding history of chronic pain/fibromyalgia.  She is currently established with a pain clinic.  She takes Kadian, tramadol and Neurontin through their office.  Her symptoms are moderately controlled with this regimen.  2.  Insomnia/anxiety Patient's previous PCP was prescribing Ambien CR 12.5 mg nightly as needed sleep.  She notes intermittent use of this medication, often only using about 3 days/week because she does not want to develop a tolerance to the medication.  She alternates with Soma 350 mg and sometimes alprazolam 0.5 mg daily.  She apparently has been on this regimen for quite some time and has never had complications with concomitant use of opioids, though she has been counseled on risks.  Sometimes she also use the alprazolam as needed panic or anxiety.  She is not currently on any controller medications for anxiety disorder.  3.  IBS Patient reports intermittent IBS which she describes as severe cramping.  During this time she will use paregoric solution with good relief of symptoms.  Apparently this was initially prescribed by her previous gastroenterologist, Dr. Elicia Lamp, who has since retired.  She plans on establishing care with Dr. Silverio Decamp.  She thinks that she is due for colonoscopy.  Does not report any rectal bleeding.  4.  Thyroid disorder Patient was diagnosed as a borderline/subacute hypothyroidism at some point.  Denies any history of radiation or surgery to the neck.  No known family history of thyroid disorder.  She is unsure of the symptoms are related to a true thyroid disorder or something else that she has going on.  She reports compliance with Synthroid 25 mcg daily.  She also takes biotin 1000 milligrams daily.  She was unaware that the  biotin can interfere with TSH labs through [facility].  She has intermittent diarrhea as above.  Does not report any unplanned weight loss, heart palpitations or tremor.  5.  Osteoporosis Patient reports diagnosis of osteoporosis around 1997 or 1998.  She is initially treated with Forteo for about a year and a half.  She was ultimately transitioned over to just vitamin D, calcium and maintains with weightbearing exercise.  Her most recent DEXA scan showed quite a bit of improvement from initial T score of -3 point something to now in the osteopenic range.  The DEXA scans and Pap smears are done with Linda Hedges at [facility].  She gets her mammograms done at the breast center with [facility] in [place].  ROS: Per HPI  Allergies  Allergen Reactions  . Compazine [Prochlorperazine Edisylate] Other (See Comments)    Severe muscle requiring ER visit  . Penicillins Hives  . Prochlorperazine   . Sulfamethoxazole-Trimethoprim   . Sulfa Antibiotics Rash   Past Medical History:  Diagnosis Date  . Chronic back pain   . Chronic neck pain   . Family history of heart disease 02/25/2014  . Fibromyalgia 05/15/2013  . GERD (gastroesophageal reflux disease) 05/15/2013  . Hyperlipemia 05/15/2013  . Hypothyroidism 05/15/2013  . IBS (irritable bowel syndrome)   . Osteopenia   . Osteoporosis 05/15/2013   History of past treatment with Forteo   . Raynaud's disease 02/25/2014    Current Outpatient Medications:  .  ALPRAZolam (XANAX) 0.5 MG tablet, Take 1 tablet (0.5 mg total) by mouth at bedtime as needed., Disp: 90 tablet,  Rfl: 1 .  Biotin 1000 MCG tablet, Take 1,000 mcg by mouth 3 (three) times daily., Disp: , Rfl:  .  Calcium Carbonate-Vitamin D (CALCIUM PLUS VITAMIN D) 300-100 MG-UNIT CAPS, Take 4 tablets by mouth daily., Disp: , Rfl:  .  carisoprodol (SOMA) 350 MG tablet, Take 1 tablet (350 mg total) by mouth 2 (two) times daily as needed., Disp: 180 tablet, Rfl: 2 .  clobetasol  (TEMOVATE) 0.05 % GEL, Apply to affected area BID PRN, Disp: 30 each, Rfl: 3 .  Coenzyme Q10 (CO Q 10 PO), Take by mouth., Disp: , Rfl:  .  Cyanocobalamin (VITAMIN B 12 PO), Take by mouth., Disp: , Rfl:  .  estradiol (VIVELLE-DOT) 0.05 MG/24HR patch, estradiol 0.05 mg/24 hr semiweekly transdermal patch  APPLY 1 PATCH TWICE A WEEK, Disp: , Rfl:  .  KRILL OIL PO, Take by mouth., Disp: , Rfl:  .  levothyroxine (SYNTHROID) 50 MCG tablet, Take 1 tablet (50 mcg total) by mouth daily., Disp: 90 tablet, Rfl: 3 .  morphine (KADIAN) 60 MG 24 hr capsule, Take 60 mg by mouth 2 (two) times daily., Disp: , Rfl:  .  Multiple Vitamins-Minerals (CENTRUM SILVER PO), Take 1 tablet by mouth daily., Disp: , Rfl:  .  NEURONTIN 100 MG capsule, Take 100-200 mg by mouth at bedtime., Disp: , Rfl:  .  ondansetron (ZOFRAN) 4 MG tablet, TAKE ONE TABLET (4MG TOTAL) BY MOUTH EVERY EIGHT HOURS AS NEEDED FOR NAUSEA OR VOMITING, Disp: 30 tablet, Rfl: 0 .  paregoric 2 MG/5ML solution, Take 1 teaspoon by mouth every 8-12 hours as needed for severe diarrhea, Disp: 240 mL, Rfl: 2 .  Red Yeast Rice Extract (RED YEAST RICE PO), Take 600 mg by mouth 2 (two) times daily. , Disp: , Rfl:  .  traMADol (ULTRAM) 50 MG tablet, Take 50 mg by mouth 2 (two) times daily., Disp: , Rfl:  .  zolpidem (AMBIEN CR) 12.5 MG CR tablet, Take 1 tablet (12.5 mg total) by mouth at bedtime as needed for sleep., Disp: 90 tablet, Rfl: 1 .  UNABLE TO FIND, Med Name: NitroBID cream - from rheumotology - topical for hands, Disp: , Rfl:  Social History   Socioeconomic History  . Marital status: Married    Spouse name: Not on file  . Number of children: Not on file  . Years of education: Not on file  . Highest education level: Not on file  Occupational History  . Not on file  Tobacco Use  . Smoking status: Never Smoker  . Smokeless tobacco: Never Used  Substance and Sexual Activity  . Alcohol use: No  . Drug use: No  . Sexual activity: Not on file  Other  Topics Concern  . Not on file  Social History Narrative  . Not on file   Social Determinants of Health   Financial Resource Strain:   . Difficulty of Paying Living Expenses:   Food Insecurity:   . Worried About Charity fundraiser in the Last Year:   . Arboriculturist in the Last Year:   Transportation Needs:   . Film/video editor (Medical):   Marland Kitchen Lack of Transportation (Non-Medical):   Physical Activity:   . Days of Exercise per Week:   . Minutes of Exercise per Session:   Stress:   . Feeling of Stress :   Social Connections:   . Frequency of Communication with Friends and Family:   . Frequency of Social Gatherings with Friends  and Family:   . Attends Religious Services:   . Active Member of Clubs or Organizations:   . Attends Archivist Meetings:   Marland Kitchen Marital Status:   Intimate Partner Violence:   . Fear of Current or Ex-Partner:   . Emotionally Abused:   Marland Kitchen Physically Abused:   . Sexually Abused:    Family History  Problem Relation Age of Onset  . Heart disease Mother   . Heart disease Father   . Heart disease Other        family history   . Colon cancer Other        family history     Objective: Office vital signs reviewed. BP 120/77   Pulse 86   Temp 98.1 F (36.7 C) (Temporal)   Ht '5\' 5"'  (1.651 m)   Wt 110 lb (49.9 kg)   SpO2 99%   BMI 18.30 kg/m   Physical Examination:  General: Awake, alert, well nourished, well appearing female. No acute distress HEENT: Normal, sclera white, MMM Cardio: regular rate and rhythm, S1S2 heard, no murmurs appreciated Pulm: clear to auscultation bilaterally, no wheezes, rhonchi or rales; normal work of breathing on room air GI: soft, non-tender, non-distended, bowel sounds present x4, no hepatomegaly, no splenomegaly, no masses MSK: Normal gait and station Skin: dry; intact; no rashes or lesions; normal temperature Neuro: No tremor Psych: Mood stable, speech normal, affect appropriate, pleasant,  interactive Depression screen [facility] 2/9 01/21/2020 07/22/2018 12/20/2017  Decreased Interest 0 0 0  Down, Depressed, Hopeless 0 0 0  PHQ - 2 Score 0 0 0  Altered sleeping 0 - -  Tired, decreased energy 0 - -  Change in appetite 0 - -  Feeling bad or failure about yourself  0 - -  Trouble concentrating 0 - -  Moving slowly or fidgety/restless 0 - -  Suicidal thoughts 0 - -  PHQ-9 Score 0 - -   No flowsheet data found.  Assessment/ Plan: 64 y.o. female   1. Hypothyroidism due to acquired atrophy of thyroid Uncertain if this is a true diagnosis.  She will come in for future labs.  I have asked her to hold her biotin for at least 2 weeks prior to coming in for thyroid labs as this can interfere with the [facility] lab.  She voiced good understanding.  If she is within the lower end of TSH, we could consider trial off of the Synthroid.  She seems motivated to be on less medication if possible.  2. Establishing care with new doctor, encounter for I reviewed her chart.  3. Age-related osteoporosis without current pathological fracture Diagnosed with OB/GYN.  She continues to get bone density scans with her OB/GYN.  Release of information form completed today.  Check calcium and vitamin D levels.  Thyroid check as above - CMP14+EGFR - CBC - VITAMIN D 25 Hydroxy (Vit-D Deficiency, Fractures)  4. Vitamin D deficiency - VITAMIN D 25 Hydroxy (Vit-D Deficiency, Fractures)  5. Pure hypercholesterolemia - CMP14+EGFR - Lipid Panel  6. Fibromyalgia She is under the care of pain management.  I did voice some concerns with the intermittent use of benzodiazepine.  She unfortunately is on multiple sedating medications, all of which have potential for dependence.  I do think that her pain is true and she likely needs the medications.  However, I did voiced to her that Xanax is not the best choice for her if she needs something for sleep, which she uses both the [place] and  the Ambien for, and or panic.  I would  sooner prescribe something like Vistaril or BuSpar as these would not have these concerning interactions that are unfortunately leaving to quite a bit of deaths.  I would not plan to continue Xanax going forward. - CBC - carisoprodol (SOMA) 350 MG tablet; Take 1 tablet (350 mg total) by mouth 2 (two) times daily as needed.  Dispense: 180 tablet; Refill: 2  7. Irritable bowel syndrome with diarrhea Previously prescribed paregoric.  I was not actually very familiar with this medication.  Apparently it is used as a treatment for diarrhea.  It is an opioid medication which does cause me concern given her other medications.  From what I can understand this is no longer FDA approved.  I am going to defer to her gastroenterologist to determine if they feel comfortable continuing this medication which apparently has not been shown to be very effective nor recommended.  I suspect there are alternatives that may be less risky.  I have placed a referral to GI as per her request. - CBC  8. Primary insomnia The national narcotic database was reviewed.  She has been established with pain management.  She is using the Ambien responsibly and only as needed, alternating with the soma.  For now, I think that this regimen is okay.  However again I reinforced that the combinations of medications that she is on currently may become an issue as she ages particularly. - carisoprodol (SOMA) 350 MG tablet; Take 1 tablet (350 mg total) by mouth 2 (two) times daily as needed.  Dispense: 180 tablet; Refill: 2 - zolpidem (AMBIEN CR) 12.5 MG CR tablet; Take 1 tablet (12.5 mg total) by mouth at bedtime as needed for sleep.  Dispense: 90 tablet; Refill: 1  9. Controlled substance agreement signed Controlled substance contract completed.  Urine drug screen obtained as per office policy.  Plan to renew annually. - ToxASSURE Select 13 (MW), Urine   Orders Placed This Encounter  Procedures  . CMP14+EGFR  . Lipid Panel  . CBC  .  VITAMIN D 25 Hydroxy (Vit-D Deficiency, Fractures)  . ToxASSURE Select 13 (MW), Urine  . Thyroid Panel With TSH    Standing Status:   Future    Standing Expiration Date:   01/20/2021  . Ambulatory referral to Gastroenterology    Referral Priority:   Routine    Referral Type:   Consultation    Referral Reason:   Specialty Services Required    Number of Visits Requested:   1   Meds ordered this encounter  Medications  . carisoprodol (SOMA) 350 MG tablet    Sig: Take 1 tablet (350 mg total) by mouth 2 (two) times daily as needed.    Dispense:  180 tablet    Refill:  2  . zolpidem (AMBIEN CR) 12.5 MG CR tablet    Sig: Take 1 tablet (12.5 mg total) by mouth at bedtime as needed for sleep.    Dispense:  90 tablet    Refill:  [place], [place] [phone]

## 2020-01-21 NOTE — Patient Instructions (Signed)
We talked about the risks of benzodiazepine use with opioids.  While I recognize that many patients have been on combos of these medications for years, it is just being discovered that this combination carries an extremely high risk.  I do not recommend taking these medications.  I would discontinue the Xanax.  We can discuss other options if needed going forward for anxiety.  I am going to contact Dr Silverio Decamp about IBS-D meds for you.  I have also placed a referral for you today.  If you do not hear for an appointment, please call the office.  I will reach out to [facility] about the DEXA, Mammogram and pap results.  Thank you for taking time to complete this release of information today.  Please hold your Biotin for 2 weeks before coming in for your thyroid lab.  This medication unfortunately interferes with the [facility] thyroid test.  You may resume after lab is collected.  You had labs performed today.  You will be contacted with the results of the labs once they are available, usually in the next 3 business days for routine lab work.  If you have an active my chart account, they will be released to your MyChart.  If you prefer to have these labs released to you via telephone, please let us know.  If you had a pap smear or biopsy performed, expect to be contacted in about 7-10 days.  Controlled Substance Guidelines:  1. You cannot get an early refill, even it is lost.  2. You cannot get controlled medications from any other doctor, unless it is the emergency department and related to a new problem or injury.  3. You cannot use alcohol, marijuana, cocaine or any other recreational drugs while using this medication. This is very dangerous.  4. You are willing to have your urine drug tested at each visit.  5. You will not drive while using this medication, because that can put yourself and others in serious danger of an accident. 6. If any medication is stolen, then there must be a police report  to verify it, or it cannot be refilled.  7. I will not prescribe these medications for longer than 3 months.  8. You must bring your pill bottle to each visit.  9. You must use the same pharmacy for all refills for the medication, unless you clear it with me beforehand.  10. You cannot share or sell this medication.

## 2020-01-22 ENCOUNTER — Telehealth: Payer: Self-pay | Admitting: *Deleted

## 2020-01-22 LAB — CMP14+EGFR
ALT: 14 IU/L (ref 0–32)
AST: 21 IU/L (ref 0–40)
Albumin/Globulin Ratio: 1.6 (ref 1.2–2.2)
Albumin: 4.2 g/dL (ref 3.8–4.8)
Alkaline Phosphatase: 49 IU/L (ref 39–117)
BUN/Creatinine Ratio: 10 — ABNORMAL LOW (ref 12–28)
BUN: 7 mg/dL — ABNORMAL LOW (ref 8–27)
Bilirubin Total: 0.3 mg/dL (ref 0.0–1.2)
CO2: 26 mmol/L (ref 20–29)
Calcium: 8.7 mg/dL (ref 8.7–10.3)
Chloride: 101 mmol/L (ref 96–106)
Creatinine, Ser: 0.68 mg/dL (ref 0.57–1.00)
GFR calc Af Amer: 108 mL/min/{1.73_m2} (ref >59)
GFR calc non Af Amer: 93 mL/min/{1.73_m2} (ref >59)
Globulin, Total: 2.6 g/dL (ref 1.5–4.5)
Glucose: 83 mg/dL (ref 65–99)
Potassium: 4.2 mmol/L (ref 3.5–5.2)
Sodium: 137 mmol/L (ref 134–144)
Total Protein: 6.8 g/dL (ref 6.0–8.5)

## 2020-01-22 LAB — CBC
Hematocrit: 39 % (ref 34.0–46.6)
Hemoglobin: 12.8 g/dL (ref 11.1–15.9)
MCH: 30.9 pg (ref 26.6–33.0)
MCHC: 32.8 g/dL (ref 31.5–35.7)
MCV: 94 fL (ref 79–97)
Platelets: 211 10*3/uL (ref 150–450)
RBC: 4.14 x10E6/uL (ref 3.77–5.28)
RDW: 12.4 % (ref 11.7–15.4)
WBC: 5.2 10*3/uL (ref 3.4–10.8)

## 2020-01-22 LAB — LIPID PANEL
Chol/HDL Ratio: 2.7 ratio (ref 0.0–4.4)
Cholesterol, Total: 154 mg/dL (ref 100–199)
HDL: 58 mg/dL (ref >39)
LDL Chol Calc (NIH): 85 mg/dL (ref 0–99)
Triglycerides: 54 mg/dL (ref 0–149)
VLDL Cholesterol Cal: 11 mg/dL (ref 5–40)

## 2020-01-22 LAB — VITAMIN D 25 HYDROXY (VIT D DEFICIENCY, FRACTURES): Vit D, 25-Hydroxy: 73.6 ng/mL (ref 30.0–100.0)

## 2020-01-22 NOTE — Telephone Encounter (Signed)
Concern about Soma quantity? Usual quantity is forty two.  Medication used for acute case of three weeks or so.  High probability of addition.  Please advise if high quantity correct. Call  [phone] ,reference # [id number]

## 2020-01-23 ENCOUNTER — Telehealth: Payer: Self-pay

## 2020-01-23 MED ORDER — CARISOPRODOL 350 MG PO TABS: 350.0000 mg | ORAL_TABLET | Freq: Two times a day (BID) | ORAL | 2 refills | Status: DC | PRN

## 2020-01-23 NOTE — Telephone Encounter (Signed)
Received a call from [facility] about patient's Soma rx.  Pharmacy states that it is recommenced for only acute for 2-3 weeks of duration.  States that since it is controlled it is more likely to be abused.  Suggesting it get changed to a non controlled medication.  Please advise and send back to pools.

## 2020-01-23 NOTE — Telephone Encounter (Signed)
Done

## 2020-01-23 NOTE — Addendum Note (Signed)
Addended by: Janora Norlander on: 01/23/2020 01:53 PM   Modules accepted: Orders

## 2020-01-25 LAB — TOXASSURE SELECT 13 (MW), URINE

## 2020-02-24 ENCOUNTER — Encounter: Payer: Self-pay | Admitting: Gastroenterology

## 2020-02-24 ENCOUNTER — Ambulatory Visit (INDEPENDENT_AMBULATORY_CARE_PROVIDER_SITE_OTHER): Payer: BC Managed Care – PPO | Admitting: Gastroenterology

## 2020-02-24 VITALS — BP 102/64 | HR 90 | Temp 97.1°F | Ht 65.0 in | Wt 110.2 lb

## 2020-02-24 DIAGNOSIS — R1013 Epigastric pain: Secondary | ICD-10-CM

## 2020-02-24 DIAGNOSIS — K219 Gastro-esophageal reflux disease without esophagitis: Secondary | ICD-10-CM

## 2020-02-24 DIAGNOSIS — Z86010 Personal history of colonic polyps: Secondary | ICD-10-CM | POA: Diagnosis not present

## 2020-02-24 DIAGNOSIS — K582 Mixed irritable bowel syndrome: Secondary | ICD-10-CM

## 2020-02-24 DIAGNOSIS — Z800 Family history of malignant neoplasm of digestive organs: Secondary | ICD-10-CM

## 2020-02-24 DIAGNOSIS — R140 Abdominal distension (gaseous): Secondary | ICD-10-CM

## 2020-02-24 DIAGNOSIS — R109 Unspecified abdominal pain: Secondary | ICD-10-CM

## 2020-02-24 MED ORDER — SUPREP BOWEL PREP KIT 17.5-3.13-1.6 GM/177ML PO SOLN: 1.0000 | ORAL | 0 refills | Status: DC

## 2020-02-24 NOTE — Progress Notes (Signed)
Wanda Robertson    [id number]    [birth year]  Primary Care Physician:Rakes, Connye Burkitt, FNP  Referring Physician: Baruch Gouty, FNP No address on file   Chief complaint:  Gastritis, Nausea, abdominal pain  HPI:  64 year old very pleasant female, previously followed by Dr. Olevia Perches, last seen in 2013 here to reestablish care  She has history of chronic GERD and dyspepsia, takes over-the-counter antacids and is currently taking Zegerid but continues to have breakthrough symptoms.  She feels she is needing to stay on Zegerid longer than she has been in the past.  If she does not take it, has stomach upset indigestion and heartburn.  She was using Paregoric (anhydrous morphine) and Librax for chronic IBS symptoms, was prescribed by Dr. Olevia Perches and primary care physician but previous providers have retired and her local pharmacy does not carry the medications any longer either so she has not had any prescriptions in the past year or so  Has generalized abdominal discomfort, nausea and abdominal cramping associated with bloating  Denies any dysphagia, odynophagia, loss of appetite, weight loss, melena or blood per rectum  She is vegan for the past 7 years and previously was predominantly vegetarian.  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 ALPRAZolam (XANAX) 0.5 MG tablet Take 1 tablet (0.5 mg  total) by mouth at bedtime as needed.  . Biotin 1000 MCG tablet Take 1,000 mcg by mouth daily.   . Calcium Carbonate-Vitamin D (CALCIUM PLUS VITAMIN D) 300-100 MG-UNIT CAPS Take 4 tablets by mouth daily.  . carisoprodol (SOMA) 350 MG tablet Take 1 tablet (350 mg total) by mouth 2 (two) times daily as needed.  . clobetasol (TEMOVATE) 0.05 % GEL Apply to affected area BID PRN  . Coenzyme Q10 (CO Q 10 PO) Take 1 tablet by mouth daily.   . Cyanocobalamin (VITAMIN B 12 PO) Take 1 tablet by mouth daily.   Marland Kitchen estradiol (VIVELLE-DOT) 0.05 MG/24HR patch estradiol 0.05 mg/24 hr semiweekly transdermal patch  APPLY 1 PATCH TWICE A WEEK  . KRILL OIL PO Take 1 tablet by mouth daily.   Marland Kitchen morphine (KADIAN) 50 MG 24 hr capsule Take 50 mg by mouth 2 (two) times daily.   . Multiple Vitamins-Minerals (CENTRUM SILVER PO) Take 1 tablet by mouth daily.  . ondansetron (ZOFRAN) 4 MG tablet TAKE ONE TABLET (4MG  TOTAL) BY MOUTH EVERY EIGHT HOURS AS NEEDED FOR NAUSEA OR VOMITING  . Red Yeast Rice Extract (RED YEAST RICE PO) Take 600 mg by mouth daily.   . traMADol (ULTRAM) 50 MG tablet Take 50 mg by mouth 2 (two) times daily.  Marland Kitchen UNABLE TO FIND Med Name: NitroBID cream - from rheumotology - topical for hands  . zolpidem (AMBIEN CR) 12.5 MG CR tablet Take 1 tablet (12.5 mg total) by mouth at bedtime as needed for sleep.  . [DISCONTINUED] levothyroxine (SYNTHROID) 50 MCG tablet Take 1 tablet (50 mcg total) by mouth daily. (  Patient not taking: Reported on 02/24/2020)  . [DISCONTINUED] NEURONTIN 100 MG capsule Take 100-200 mg by mouth at bedtime.  . [DISCONTINUED] ondansetron (ZOFRAN) 4 MG tablet Take 1 tablet (4 mg total) by mouth every 8 (eight) hours as needed for nausea or vomiting.  . [DISCONTINUED] paregoric 2 MG/5ML solution Take 1 teaspoon by mouth every 8-12 hours as needed for severe diarrhea (Patient not taking: Reported on 02/24/2020)   No facility-administered encounter medications on file as of 02/24/2020.     Allergies as of 02/24/2020 - Review Complete 02/24/2020  Allergen Reaction Noted  . Compazine [prochlorperazine edisylate] Other (See Comments) 05/15/2013  . Penicillins Hives 05/15/2013  . Prochlorperazine  08/21/2014  . Sulfamethoxazole-trimethoprim  08/21/2014  . Sulfa antibiotics Rash 05/15/2013    Past Medical History:  Diagnosis Date  . Chronic back pain   . Chronic neck pain   . Family history of heart disease 02/25/2014  . Fibromyalgia 05/15/2013  . GERD (gastroesophageal reflux disease) 05/15/2013  . Hyperlipemia 05/15/2013  . Hypothyroidism 05/15/2013  . IBS (irritable bowel syndrome)   . Osteopenia   . Osteoporosis 05/15/2013   History of past treatment with Forteo   . Raynaud's disease 02/25/2014    Past Surgical History:  Procedure Laterality Date  . COLONOSCOPY    . TONSILLECTOMY      Family History  Problem Relation Age of Onset  . Heart disease Mother   . Heart disease Father   . Colon cancer Father   . Heart disease Other        family history   . Esophageal cancer Neg Hx   . Pancreatic cancer Neg Hx   . Stomach cancer Neg Hx     Social History   Socioeconomic History  . Marital status: Married    Spouse name: Not on file  . Number of children: Not on file  . Years of education: Not on file  . Highest education level: Not on file  Occupational History  . Not on file  Tobacco Use  . Smoking status: Never Smoker  . Smokeless tobacco: Never Used  Substance and Sexual Activity  . Alcohol use: No  . Drug use: No  . Sexual activity: Not on file  Other Topics Concern  . Not on file  Social History Narrative  . Not on file   Social Determinants of Health   Financial Resource Strain:   . Difficulty of Paying Living Expenses:   Food Insecurity:   . Worried About Charity fundraiser in the Last Year:   . Arboriculturist in the Last Year:   Transportation Needs:   . Film/video editor (Medical):   Marland Kitchen Lack of Transportation  (Non-Medical):   Physical Activity:   . Days of Exercise per Week:   . Minutes of Exercise per Session:   Stress:   . Feeling of Stress :   Social Connections:   . Frequency of Communication with Friends and Family:   . Frequency of Social Gatherings with Friends and Family:   . Attends Religious Services:   . Active Member of Clubs or Organizations:   . Attends Archivist Meetings:   Marland Kitchen Marital Status:   Intimate Partner Violence:   . Fear of Current or Ex-Partner:   . Emotionally Abused:   Marland Kitchen Physically Abused:   . Sexually Abused:       Review of systems: All other review of systems negative except as mentioned in the HPI.  Physical Exam: Vitals:   02/24/20 1405  BP: 102/64  Pulse: 90  Temp: (!) 97.1 F (36.2 C)  SpO2: 100%   Body mass index is 18.35 kg/m. Gen:      No acute distress Abd:      Soft, non-tender; no palpable masses, no distension Neuro: alert and oriented x 3 Psych: normal mood and affect  Data Reviewed:  Reviewed labs, radiology imaging, old records and pertinent past GI work up   Assessment and Plan/Recommendations:  64 year old very pleasant female with history of fibromyalgia, chronic irritable bowel syndrome and chronic GERD  GERD, dyspepsia: Recent exacerbation of symptoms, also has intermittent epigastric abdominal discomfort and pain Scheduled for EGD for further evaluation, exclude peptic ulcer disease, gastritis or erosive esophagitis Continue Zegerid daily Antireflux measures  Family history of colon cancer and personal history of large tubulovillous adenoma removed in 2003 Past due for surveillance colonoscopy Schedule for colonoscopy along with EGD  The risks and benefits as well as alternatives of endoscopic procedure(s) have been discussed and reviewed. All questions answered. The patient agrees to proceed.  IBS with alternating constipation and diarrhea Continue with high-fiber diet and increase fluid  intake Benefiber 1 tablespoon twice daily with meals MiraLAX half capful daily  Abdominal bloating and cramping: Trial of IBgard 1 capsule up to 3 times daily as needed    The patient was provided an opportunity to ask questions and all were answered. The patient agreed with the plan and demonstrated an understanding of the instructions.  Damaris Hippo , MD    CC: Baruch Gouty, FNP

## 2020-02-24 NOTE — Patient Instructions (Addendum)
Continue Zegrid daily as needed.   IBgard(over the counter) -1 capsule three times daily as needed.   Miralax-1/2 capful daily.   A high fiber diet with plenty of fluids (up to 8 glasses of water daily) is suggested to relieve these symptoms.  Benefiber, 1 tablespoon twice daily can be used to keep bowels regular if needed.  You have been scheduled for an endoscopy and colonoscopy. Please follow the written instructions given to you at your visit today. Please pick up your prep supplies at the pharmacy within the next 1-3 days. If you use inhalers (even only as needed), please bring them with you on the day of your procedure.  We have sent the following medications to your pharmacy for you to pick up at your convenience: Suprep    If you are age 91 or older, your body mass index should be between 23-30. Your Body mass index is 18.35 kg/m. If this is out of the aforementioned range listed, please consider follow up with your Primary Care Provider.  If you are age 23 or younger, your body mass index should be between 19-25. Your Body mass index is 18.35 kg/m. If this is out of the aformentioned range listed, please consider follow up with your Primary Care Provider.   Due to recent changes in healthcare laws, you may see the results of your imaging and laboratory studies on MyChart before your provider has had a chance to review them.  We understand that in some cases there may be results that are confusing or concerning to you. Not all laboratory results come back in the same time frame and the provider may be waiting for multiple results in order to interpret others.  Please give [place] 48 hours in order for your provider to thoroughly review all the results before contacting the office for clarification of your results.   Thank you for choosing me and [facility].  Dr.Nandigam

## 2020-02-25 ENCOUNTER — Encounter: Payer: Self-pay | Admitting: Gastroenterology

## 2020-03-04 DIAGNOSIS — Z79891 Long term (current) use of opiate analgesic: Secondary | ICD-10-CM | POA: Diagnosis not present

## 2020-03-04 DIAGNOSIS — M47812 Spondylosis without myelopathy or radiculopathy, cervical region: Secondary | ICD-10-CM | POA: Diagnosis not present

## 2020-03-04 DIAGNOSIS — M797 Fibromyalgia: Secondary | ICD-10-CM | POA: Diagnosis not present

## 2020-03-04 DIAGNOSIS — G894 Chronic pain syndrome: Secondary | ICD-10-CM | POA: Diagnosis not present

## 2020-04-01 DIAGNOSIS — R87619 Unspecified abnormal cytological findings in specimens from cervix uteri: Secondary | ICD-10-CM

## 2020-04-01 DIAGNOSIS — Z681 Body mass index (BMI) 19 or less, adult: Secondary | ICD-10-CM | POA: Diagnosis not present

## 2020-04-01 DIAGNOSIS — K469 Unspecified abdominal hernia without obstruction or gangrene: Secondary | ICD-10-CM

## 2020-04-01 DIAGNOSIS — Z01419 Encounter for gynecological examination (general) (routine) without abnormal findings: Secondary | ICD-10-CM | POA: Diagnosis not present

## 2020-04-01 HISTORY — DX: Unspecified abdominal hernia without obstruction or gangrene: K46.9

## 2020-04-01 HISTORY — DX: Unspecified abnormal cytological findings in specimens from cervix uteri: R87.619

## 2020-04-15 ENCOUNTER — Encounter: Payer: BC Managed Care – PPO | Admitting: Gastroenterology

## 2020-05-06 DIAGNOSIS — M47812 Spondylosis without myelopathy or radiculopathy, cervical region: Secondary | ICD-10-CM | POA: Diagnosis not present

## 2020-05-06 DIAGNOSIS — G894 Chronic pain syndrome: Secondary | ICD-10-CM | POA: Diagnosis not present

## 2020-05-06 DIAGNOSIS — M797 Fibromyalgia: Secondary | ICD-10-CM | POA: Diagnosis not present

## 2020-05-06 DIAGNOSIS — Z79891 Long term (current) use of opiate analgesic: Secondary | ICD-10-CM | POA: Diagnosis not present

## 2020-05-10 DIAGNOSIS — N8110 Cystocele, unspecified: Secondary | ICD-10-CM | POA: Diagnosis not present

## 2020-05-19 DIAGNOSIS — Z1231 Encounter for screening mammogram for malignant neoplasm of breast: Secondary | ICD-10-CM | POA: Diagnosis not present

## 2020-06-10 ENCOUNTER — Encounter: Payer: Self-pay | Admitting: Cardiology

## 2020-06-10 ENCOUNTER — Ambulatory Visit
Admission: RE | Admit: 2020-06-10 | Discharge: 2020-06-10 | Disposition: A | Discharge status: Home / Self Care | Payer: Self-pay | Source: Ambulatory Visit | Attending: Cardiology | Admitting: Cardiology

## 2020-06-10 ENCOUNTER — Telehealth: Payer: Self-pay | Admitting: Radiology

## 2020-06-10 ENCOUNTER — Other Ambulatory Visit: Payer: Self-pay

## 2020-06-10 ENCOUNTER — Ambulatory Visit (INDEPENDENT_AMBULATORY_CARE_PROVIDER_SITE_OTHER): Payer: BC Managed Care – PPO | Admitting: Cardiology

## 2020-06-10 VITALS — BP 112/70 | HR 95 | Ht 64.5 in | Wt 104.4 lb

## 2020-06-10 DIAGNOSIS — Z8249 Family history of ischemic heart disease and other diseases of the circulatory system: Secondary | ICD-10-CM

## 2020-06-10 DIAGNOSIS — R931 Abnormal findings on diagnostic imaging of heart and coronary circulation: Secondary | ICD-10-CM

## 2020-06-10 DIAGNOSIS — E782 Mixed hyperlipidemia: Secondary | ICD-10-CM

## 2020-06-10 DIAGNOSIS — I491 Atrial premature depolarization: Secondary | ICD-10-CM

## 2020-06-10 NOTE — Patient Instructions (Signed)
Medication Instructions:   Your physician recommends that you continue on your current medications as directed. Please refer to the Current Medication list given to you today.  *If you need a refill on your cardiac medications before your next appointment, please call your pharmacy*  Testing/Procedures:  Your physician has requested that you have an echocardiogram. Echocardiography is a painless test that uses sound waves to create images of your heart. It provides your doctor with information about the size and shape of your heart and how well your heart's chambers and valves are working. This procedure takes approximately one hour. There are no restrictions for this procedure.  CARDIAC CALCIUM SCORE DONE HERE IN THE OFFICE   ZIO XT- Long Term Monitor Instructions   Your physician has requested you wear your ZIO patch monitor___7____days.   This is a single patch monitor.  [employer] supplies one patch monitor per enrollment.  Additional stickers are not available.   Please do not apply patch if you will be having a Nuclear Stress Test, Echocardiogram, Cardiac CT, MRI, or Chest Xray during the time frame you would be wearing the monitor. The patch cannot be worn during these tests.  You cannot remove and re-apply the ZIO XT patch monitor.   Your ZIO patch monitor will be sent USPS Priority mail from [facility] directly to your home address. The monitor may also be mailed to a PO BOX if home delivery is not available.   It may take 3-5 days to receive your monitor after you have been enrolled.   Once you have received you monitor, please review enclosed instructions.  Your monitor has already been registered assigning a specific monitor serial # to you.   Applying the monitor   Shave hair from upper left chest.   Hold abrader disc by orange tab.  Rub abrader in 40 strokes over left upper chest as indicated in your monitor instructions.   Clean area with 4 enclosed alcohol pads .   Use all pads to assure are is cleaned thoroughly.  Let dry.   Apply patch as indicated in monitor instructions.  Patch will be place under collarbone on left side of chest with arrow pointing upward.   Rub patch adhesive wings for 2 minutes.Remove white label marked "1".  Remove white label marked "2".  Rub patch adhesive wings for 2 additional minutes.   While looking in a mirror, press and release button in center of patch.  A small green light will flash 3-4 times .  This will be your only indicator the monitor has been turned on.     Do not shower for the first 24 hours.  You may shower after the first 24 hours.   Press button if you feel a symptom. You will hear a small click.  Record Date, Time and Symptom in the Patient Log Book.   When you are ready to remove patch, follow instructions on last 2 pages of Patient Log Book.  Stick patch monitor onto last page of Patient Log Book.   Place Patient Log Book in [place] box.  Use locking tab on box and tape box closed securely.  The [employer] has [employer] on it.  Please place in mailbox as soon as possible.  Your physician should have your test results approximately 7 days after the monitor has been mailed back to [facility].   Call [facility] at [phone] if you have questions regarding your ZIO XT patch monitor.  Call them immediately if you see an orange light blinking on your monitor.   If your monitor falls off in less than 4 days contact our Monitor department at [phone].  If your monitor becomes loose or falls off after 4 days call [employer] at [phone] for suggestions on securing your monitor.   Follow-Up:  4 MONTHS OR AROUND THAT TIME, WITH DR. Meda Coffee IN THE OFFICE

## 2020-06-10 NOTE — Progress Notes (Signed)
Patient Name: Wanda Robertson Date of Encounter: 06/10/2020  Primary Care Provider:  Baruch Gouty, [place] Primary Cardiologist:  Dr Aundra Dubin --> Dr Meda Coffee  Problem List   Past Medical History:  Diagnosis Date  . Chronic back pain   . Chronic neck pain   . Family history of heart disease 02/25/2014  . Fibromyalgia 05/15/2013  . GERD (gastroesophageal reflux disease) 05/15/2013  . Hyperlipemia 05/15/2013  . Hypothyroidism 05/15/2013  . IBS (irritable bowel syndrome)   . Osteopenia   . Osteoporosis 05/15/2013   History of past treatment with Forteo   . Raynaud's disease 02/25/2014   Past Surgical History:  Procedure Laterality Date  . COLONOSCOPY    . TONSILLECTOMY      Allergies  Allergies  Allergen Reactions  . Compazine [Prochlorperazine Edisylate] Other (See Comments)    Severe muscle requiring ER visit  . Penicillins Hives  . Prochlorperazine   . Sulfamethoxazole-Trimethoprim   . Sulfa Antibiotics Rash   HPI  64 year old female previously followed by Dr Aundra Dubin as a preventive cardiology. She is a younger appearing female with very healthy weight and diet - vegan for the last 20 years and moderate amount of exercise including yoga. She denies any symptoms of chest pain, SOB, LE edema, orthopnea, PND.  She has a very significant FH of early CAD, her mother had MI in her 31' and died of another MI in early 27'. Her brother had a stent placed at age 51, still living in his 80'.  She had a calcium score done in 2015 that was 20.25 Agatston units. This was 78th percentile for age and sex matched control. She is not on statin.   Thank patient is coming for follow-up, she is doing very well, she describes palpitations after sugary foods in the morning that can last 10 to 15 minutes and are not associated with chest pain or shortness of breath.  Otherwise she has no palpitation chest pain or shortness of breath with exertion, she continues very strict vegan diet and exercises on a  regular basis including yoga.  She has been compliant with her meds.  Home Medications  Prior to Admission medications   Medication Sig Start Date End Date Taking? Authorizing Provider  ALPRAZolam Duanne Moron) 0.5 MG tablet Take 1 tablet (0.5 mg total) by mouth at bedtime as needed. 06/21/17  Yes Chipper Herb, MD  aspirin EC 81 MG tablet Take 1 tablet (81 mg total) by mouth daily. 08/21/14  Yes Larey Dresser, MD  Biotin 1000 MCG tablet Take 1,000 mcg by mouth 3 (three) times daily.   Yes [provider]  Calcium Carbonate-Vitamin D (CALCIUM PLUS VITAMIN D) 300-100 MG-UNIT CAPS Take 4 tablets by mouth daily.   Yes [provider]  carisoprodol (SOMA) 350 MG tablet Take 1 tablet (350 mg total) by mouth 2 (two) times daily as needed. 06/21/17  Yes Chipper Herb, MD  clobetasol (TEMOVATE) 0.05 % GEL Apply to affected area BID PRN 05/24/15  Yes Chipper Herb, MD  Coenzyme Q10 (CO Q 10 PO) Take by mouth.   Yes [provider]  Cyanocobalamin (VITAMIN B 12 PO) Take by mouth.   Yes [provider]  fish oil-omega-3 fatty acids 1000 MG capsule Take 2 g by mouth daily.   Yes [provider]  Flaxseed, Linseed, (FLAX SEED OIL PO) Take by mouth.   Yes [provider]  isometheptene-acetaminophen-dichloralphenazone (MIDRIN) 65-100-325 MG capsule Take 1 capsule by  mouth 4 (four) times daily as needed for migraine. Maximum 5 capsules in 12 hours for migraine headaches, 8 capsules in 24 hours for tension headaches. 01/04/17  Yes Chipper Herb, MD  KRILL OIL PO Take by mouth.   Yes [provider]  levothyroxine (SYNTHROID) 50 MCG tablet Take 1 tablet (50 mcg total) by mouth daily. 06/12/16  Yes Chipper Herb, MD  morphine (KADIAN) 80 MG 24 hr capsule Take 80 mg by mouth 2 (two) times daily.   Yes [provider]  Multiple Vitamins-Minerals (CENTRUM SILVER PO) Take 1 tablet by mouth daily.   Yes [provider]  NONFORMULARY OR  COMPOUNDED ITEM Apply 1-2 g topically daily. [employer] Nail lacquer: Fluconazole 2%, Terbinafine 1%, DMSO 11/30/16  Yes Regal, Tamala Fothergill, DPM  paregoric 2 MG/5ML solution Take 1 teaspoon by mouth every 8-12 hours as needed for severe diarrhea 06/12/16  Yes Chipper Herb, MD  Terbinafine HCl (LAMISIL PO) Take 1 tablet by mouth daily.   Yes [provider]  traMADol (ULTRAM) 50 MG tablet Take 50 mg by mouth 2 (two) times daily.   Yes [provider]  UNABLE TO FIND Med Name: NitroBID cream - from rheumotology - topical for hands   Yes [provider]  zolpidem (AMBIEN CR) 12.5 MG CR tablet Take 1 tablet (12.5 mg total) by mouth at bedtime as needed for sleep. 06/21/17  Yes Chipper Herb, MD   Family History  Family History  Problem Relation Age of Onset  . Heart disease Mother   . Heart disease Father   . Colon cancer Father   . Heart disease Other        family history   . Esophageal cancer Neg Hx   . Pancreatic cancer Neg Hx   . Stomach cancer Neg Hx    Social History  Social History   Socioeconomic History  . Marital status: Married    Spouse name: Not on file  . Number of children: Not on file  . Years of education: Not on file  . Highest education level: Not on file  Occupational History  . Not on file  Tobacco Use  . Smoking status: Never Smoker  . Smokeless tobacco: Never Used  Vaping Use  . Vaping Use: Never used  Substance and Sexual Activity  . Alcohol use: No  . Drug use: No  . Sexual activity: Not on file  Other Topics Concern  . Not on file  Social History Narrative  . Not on file   Social Determinants of Health   Financial Resource Strain:   . Difficulty of Paying Living Expenses: Not on file  Food Insecurity:   . Worried About Charity fundraiser in the Last Year: Not on file  . Ran Out of Food in the Last Year: Not on file  Transportation Needs:   . Lack of Transportation (Medical): Not on file  . Lack of Transportation  (Non-Medical): Not on file  Physical Activity:   . Days of Exercise per Week: Not on file  . Minutes of Exercise per Session: Not on file  Stress:   . Feeling of Stress : Not on file  Social Connections:   . Frequency of Communication with Friends and Family: Not on file  . Frequency of Social Gatherings with Friends and Family: Not on file  . Attends Religious Services: Not on file  . Active Member of Clubs or Organizations: Not on file  . Attends Club  or Organization Meetings: Not on file  . Marital Status: Not on file  Intimate Partner Violence:   . Fear of Current or Ex-Partner: Not on file  . Emotionally Abused: Not on file  . Physically Abused: Not on file  . Sexually Abused: Not on file    Review of Systems, as per HPI, otherwise negative General:  No chills, fever, night sweats or weight changes.  Cardiovascular:  No chest pain, dyspnea on exertion, edema, orthopnea, palpitations, paroxysmal nocturnal dyspnea. Dermatological: No rash, lesions/masses Respiratory: No cough, dyspnea Urologic: No hematuria, dysuria Abdominal:   No nausea, vomiting, diarrhea, bright red blood per rectum, melena, or hematemesis Neurologic:  No visual changes, wkns, changes in mental status. All other systems reviewed and are otherwise negative except as noted above.  Physical Exam  Blood pressure 112/70, pulse 95, height 5' 4.5" (1.638 m), weight 104 lb 6.4 oz (47.4 kg), SpO2 97 %.  General: Pleasant, NAD Psych: Normal affect. Neuro: Alert and oriented X 3. Moves all extremities spontaneously. HEENT: Normal  Neck: Supple without bruits or JVD. Lungs:  Resp regular and unlabored, CTA. Heart: RRR no s3, s4, or murmurs. Abdomen: Soft, non-tender, non-distended, BS + x 4.  Extremities: No clubbing, cyanosis or edema. DP/PT/Radials 2+ and equal bilaterally.  Labs:  No results for input(s): CKTOTAL, CKMB, TROPONINI in the last 72 hours. Lab Results  Component Value Date   WBC 5.2 01/21/2020    HGB 12.8 01/21/2020   HCT 39.0 01/21/2020   MCV 94 01/21/2020   PLT 211 01/21/2020    No results found for: DDIMER Invalid input(s): POCBNP    Component Value Date/Time   NA 137 01/21/2020 1352   K 4.2 01/21/2020 1352   CL 101 01/21/2020 1352   CO2 26 01/21/2020 1352   GLUCOSE 83 01/21/2020 1352   GLUCOSE 92 09/26/2007 0140   BUN 7 (L) 01/21/2020 1352   CREATININE 0.68 01/21/2020 1352   CALCIUM 8.7 01/21/2020 1352   PROT 6.8 01/21/2020 1352   ALBUMIN 4.2 01/21/2020 1352   AST 21 01/21/2020 1352   ALT 14 01/21/2020 1352   ALKPHOS 49 01/21/2020 1352   BILITOT 0.3 01/21/2020 1352   GFRNONAA 93 01/21/2020 1352   GFRAA 108 01/21/2020 1352   Lab Results  Component Value Date   CHOL 154 01/21/2020   HDL 58 01/21/2020   LDLCALC 85 01/21/2020   TRIG 54 01/21/2020    Accessory Clinical Findings  Echocardiogram - none  ECG -low atrial rhythm, nonspecific ST-T wave abnormalities, unchanged from prior, this was personally reviewed   Assessment & Plan  1. CAD - Coronary calcium score of 20.25 Agatston units. This was 78th percentile for age and sex matched control.  She has been using red yeast rice 1200 mg daily, she has excellent lipid profile.  She is very active and is eating vegan diet.  We will repeat calcium score today based on most recent CBC CT recommendation about rescanning Patient with prior calcium scoring exam.  Patient 10-year CVD risk is calculated at 4.2%.  2. Family history of early CAD -as above.  3.  Atrial rhythm -she has new palpitation post sugary foods in the morning, will obtain a 7-day Zio patch monitor and echocardiogram as she never had any.  Follow-up in 4 months.  Ena Dawley, MD, [facility] 06/10/2020, 2:08 PM

## 2020-06-10 NOTE — Telephone Encounter (Signed)
Enrolled patient for a 7 day Zio XT monitor to be mailed to patients home.  

## 2020-06-11 ENCOUNTER — Telehealth: Payer: Self-pay | Admitting: *Deleted

## 2020-06-11 MED ORDER — ROSUVASTATIN CALCIUM 10 MG PO TABS: 10.0000 mg | ORAL_TABLET | Freq: Every day | ORAL | 1 refills | Status: DC

## 2020-06-11 NOTE — Telephone Encounter (Signed)
-----   Message from Dorothy Spark, MD sent at 06/11/2020  4:15 PM EDT ----- Regarding: FW: Calcium score is ready to be read  Her calcium score is now 503 that represents 97 percentile for age/sex. I strongly recommend that she start taking rosuvastatin 10 mg po daily and discontinues red yeast rice.  ----- Message ----- From: Fonda Kinder Sent: 06/10/2020   3:44 PM EDT To: Dorothy Spark, MD Subject: Calcium score is ready to be read

## 2020-06-11 NOTE — Telephone Encounter (Signed)
Spoke with the pt and informed her that per Dr. Meda Coffee, her Calcium score showed she is now at 503 that represents 13 percentile for her age/sex.  Informed the pt that she strongly suggest that she start taking rosuvastatin 10 mg po daily, and D/C her red yeast rice.  Confirmed the pharmacy of choice with the pt.  Informed her that I will only send in a month supply of this med, to make sure she tolerates this appropriately.   Pt verbalized understanding and agrees with this plan.

## 2020-06-14 ENCOUNTER — Ambulatory Visit (INDEPENDENT_AMBULATORY_CARE_PROVIDER_SITE_OTHER): Payer: BC Managed Care – PPO

## 2020-06-14 DIAGNOSIS — R931 Abnormal findings on diagnostic imaging of heart and coronary circulation: Secondary | ICD-10-CM | POA: Diagnosis not present

## 2020-06-14 DIAGNOSIS — I491 Atrial premature depolarization: Secondary | ICD-10-CM | POA: Diagnosis not present

## 2020-06-14 DIAGNOSIS — Z8249 Family history of ischemic heart disease and other diseases of the circulatory system: Secondary | ICD-10-CM

## 2020-06-14 DIAGNOSIS — E782 Mixed hyperlipidemia: Secondary | ICD-10-CM

## 2020-07-01 ENCOUNTER — Telehealth: Payer: Self-pay | Admitting: *Deleted

## 2020-07-01 MED ORDER — ROSUVASTATIN CALCIUM 5 MG PO TABS: 5.0000 mg | ORAL_TABLET | Freq: Every day | ORAL | 1 refills | Status: DC

## 2020-07-01 NOTE — Telephone Encounter (Signed)
Yes, 5 mg of crestor daily is appropriate

## 2020-07-01 NOTE — Telephone Encounter (Signed)
-----   Message from Dorothy Spark, MD sent at 06/29/2020 11:01 PM EDT ----- These are very benign arrhythmias, if she is symptomatic with them, I would start Metoprolol 12.5 mg po BID

## 2020-07-01 NOTE — Telephone Encounter (Signed)
Endorsed to the pt her monitor results and recommendations per Dr. Meda Coffee.  Pt states she is having no symptoms at this time, and will hold off on starting metoprolol, and touch base with [place] if she were to develop any symptoms, to have this medicine initiated. Pt verbalized understanding and agrees with this plan.  Pt did however want to let Dr. Meda Coffee know that since she started taking new regimen rosuvastatin 10 mg po daily, this has caused her some stomach issues.  Pt states she took it upon herself to decrease the dose of her rosuvastatin to taking 1/2, at 5 mg po daily, and is responding well to this, and stomach issues are better. Pt wanted to check with Dr. Meda Coffee and see if taking Rosuvastatin 5 mg po daily is beneficial to her lipid management, and if so, would we call in a 90 day supply of this.  Pt states if Dr. Meda Coffee feels this is not suitable for her cardiac hx/lipids, then she would like for her to advise on a different regimen.  Informed the pt that I will route this message to Dr. Meda Coffee to further review and advise on statin regimen, and I will follow-up with the pt thereafter, once further advisement is provided. Pt verbalized understanding and agrees with this plan.

## 2020-07-01 NOTE — Addendum Note (Signed)
Addended by: Nuala Alpha on: 07/01/2020 01:25 PM   Modules accepted: Orders

## 2020-07-01 NOTE — Telephone Encounter (Signed)
Spoke with the pt and informed her that per Dr. Meda Coffee, yes she can decrease her crestor to 5 mg po daily. Confirmed the pharmacy of choice with the pt.  Sent in a 90 day supply of this med, as requested by the pt. Pt verbalized understanding and agrees with this plan.

## 2020-07-05 DIAGNOSIS — G894 Chronic pain syndrome: Secondary | ICD-10-CM | POA: Diagnosis not present

## 2020-07-05 DIAGNOSIS — Z79891 Long term (current) use of opiate analgesic: Secondary | ICD-10-CM | POA: Diagnosis not present

## 2020-07-05 DIAGNOSIS — M797 Fibromyalgia: Secondary | ICD-10-CM | POA: Diagnosis not present

## 2020-07-05 DIAGNOSIS — M47812 Spondylosis without myelopathy or radiculopathy, cervical region: Secondary | ICD-10-CM | POA: Diagnosis not present

## 2020-07-12 ENCOUNTER — Ambulatory Visit (HOSPITAL_COMMUNITY): Payer: BC Managed Care – PPO | Attending: Internal Medicine

## 2020-07-12 ENCOUNTER — Other Ambulatory Visit: Payer: Self-pay

## 2020-07-12 DIAGNOSIS — I491 Atrial premature depolarization: Secondary | ICD-10-CM | POA: Diagnosis not present

## 2020-07-12 DIAGNOSIS — R931 Abnormal findings on diagnostic imaging of heart and coronary circulation: Secondary | ICD-10-CM | POA: Insufficient documentation

## 2020-07-12 DIAGNOSIS — E782 Mixed hyperlipidemia: Secondary | ICD-10-CM | POA: Diagnosis not present

## 2020-07-12 DIAGNOSIS — Z8249 Family history of ischemic heart disease and other diseases of the circulatory system: Secondary | ICD-10-CM

## 2020-07-12 LAB — ECHOCARDIOGRAM COMPLETE
Area-P 1/2: 4.94 cm2
S' Lateral: 2.3 cm

## 2020-07-16 ENCOUNTER — Other Ambulatory Visit: Payer: Self-pay

## 2020-07-16 ENCOUNTER — Ambulatory Visit (INDEPENDENT_AMBULATORY_CARE_PROVIDER_SITE_OTHER): Payer: BC Managed Care – PPO | Admitting: Family Medicine

## 2020-07-16 ENCOUNTER — Encounter: Payer: Self-pay | Admitting: Family Medicine

## 2020-07-16 VITALS — BP 120/75 | HR 86 | Temp 98.0°F | Ht 64.0 in | Wt 104.0 lb

## 2020-07-16 DIAGNOSIS — Z0000 Encounter for general adult medical examination without abnormal findings: Secondary | ICD-10-CM | POA: Diagnosis not present

## 2020-07-16 DIAGNOSIS — F5101 Primary insomnia: Secondary | ICD-10-CM

## 2020-07-16 DIAGNOSIS — E039 Hypothyroidism, unspecified: Secondary | ICD-10-CM

## 2020-07-16 DIAGNOSIS — M542 Cervicalgia: Secondary | ICD-10-CM

## 2020-07-16 DIAGNOSIS — Z23 Encounter for immunization: Secondary | ICD-10-CM | POA: Diagnosis not present

## 2020-07-16 DIAGNOSIS — Z79899 Other long term (current) drug therapy: Secondary | ICD-10-CM

## 2020-07-16 DIAGNOSIS — G8929 Other chronic pain: Secondary | ICD-10-CM

## 2020-07-16 DIAGNOSIS — M549 Dorsalgia, unspecified: Secondary | ICD-10-CM

## 2020-07-16 LAB — COMPREHENSIVE METABOLIC PANEL
ALT: 15 U/L (ref 0–35)
AST: 21 U/L (ref 0–37)
Albumin: 4.4 g/dL (ref 3.5–5.2)
Alkaline Phosphatase: 40 U/L (ref 39–117)
BUN: 10 mg/dL (ref 6–23)
CO2: 28 mEq/L (ref 19–32)
Calcium: 9.9 mg/dL (ref 8.4–10.5)
Chloride: 101 mEq/L (ref 96–112)
Creatinine, Ser: 0.8 mg/dL (ref 0.40–1.20)
GFR: 72.23 mL/min (ref >60.00)
Glucose, Bld: 86 mg/dL (ref 70–99)
Potassium: 4.2 mEq/L (ref 3.5–5.1)
Sodium: 138 mEq/L (ref 135–145)
Total Bilirubin: 0.5 mg/dL (ref 0.2–1.2)
Total Protein: 7.3 g/dL (ref 6.0–8.3)

## 2020-07-16 LAB — T4, FREE: Free T4: 1.03 ng/dL (ref 0.60–1.60)

## 2020-07-16 LAB — T3, FREE: T3, Free: 3.1 pg/mL (ref 2.3–4.2)

## 2020-07-16 LAB — TSH: TSH: 3.26 u[IU]/mL (ref 0.35–4.50)

## 2020-07-16 MED ORDER — ALPRAZOLAM 0.5 MG PO TABS
0.5000 mg | ORAL_TABLET | Freq: Every evening | ORAL | 1 refills | Status: DC | PRN
Start: 2020-07-16 — End: 2021-06-15

## 2020-07-16 MED ORDER — ZOLPIDEM TARTRATE ER 12.5 MG PO TBCR: 12.5000 mg | EXTENDED_RELEASE_TABLET | Freq: Every evening | ORAL | 1 refills | Status: DC | PRN

## 2020-07-16 NOTE — Patient Instructions (Addendum)
Great to meet you today.  I have refilled the  meds we discussed.  shingrix #2 provided today.   Next appt 5.5 mos.   We will call you with lab results.   Please help [place] help you:  We are honored you have chosen [facility] for your Primary Care home. Below you will find basic instructions that you may need to access in the future. Please help [place] help you by reading the instructions, which cover many of the frequent questions we experience.   Prescription refills and request:  -In order to allow more efficient response time, please call your pharmacy for all refills. They will forward the request electronically to [place]. This allows for the quickest possible response. Request left on a nurse line can take longer to refill, since these are checked as time allows between office patients and other phone calls.  - refill request can take up to 3-5 working days to complete.  - If request is sent electronically and request is appropiate, it is usually completed in 1-2 business days.  - all patients will need to be seen routinely for all chronic medical conditions requiring prescription medications (see follow-up below). If you are overdue for follow up on your condition, you will be asked to make an appointment and we will call in enough medication to cover you until your appointment (up to 30 days).  - all controlled substances will require a face to face visit to request/refill.  - if you desire your prescriptions to go through a new pharmacy, and have an active script at original pharmacy, you will need to call your pharmacy and have scripts transferred to new pharmacy. This is completed between the pharmacy locations and not by your provider.    Results: Our office handles many outgoing and incoming calls daily. If we have not contacted you within 1 week about your results, please check your mychart to see if there is a message first and if not, then contact our office.  In helping with this  matter, you help decrease call volume, and therefore allow [place] to be able to respond to patients needs more efficiently.  We will always attempt to call you with results,  normal or abnormal. However, if we are unable to reach you we will send a message in your my chart with results.   Acute office visits (sick visit):  An acute visit is intended for a new problem and are scheduled in shorter time slots to allow schedule openings for patients with new problems. This is the appropriate visit to discuss a new problem. Problems will not be addressed by phone call or Echart message. Appointment is needed if requesting treatment. In order to provide you with excellent quality medical care with proper time for you to explain your problem, have an exam and receive treatment with instructions, these appointments should be limited to one new problem per visit. If you experience a new problem, in which you desire to be addressed, please make an acute office visit, we save openings on the schedule to accommodate you. Please do not save your new problem for any other type of visit, let us take care of it properly and quickly for you.   Follow up visits:  Depending on your condition(s) your provider will need to see you routinely in order to provide you with quality care and prescribe medication(s). Most chronic conditions (Example: hypertension, Diabetes, depression/anxiety... etc), require visits a couple times a year. Your provider will  instruct you on proper follow up for your personal medical conditions and history. Please make certain to make follow up appointments for your condition as instructed. Failing to do so could result in lapse in your medication treatment/refills. If you request a refill, and are overdue to be seen on a condition, we will always provide you with a 30 day script (once) to allow you time to schedule.    Medicare wellness (well visit): - we have a wonderful Nurse Maudie Mercury), that will meet with  you and provide you will yearly medicare wellness visits. These visits should occur yearly (can not be scheduled less than 1 calendar year apart) and cover preventive health, immunizations, advance directives and screenings you are entitled to yearly through your medicare benefits. Do not miss out on your entitled benefits, this is when medicare will pay for these benefits to be ordered for you.  These are strongly encouraged by your provider and is the appropriate type of visit to make certain you are up to date with all preventive health benefits. If you have not had your medicare wellness exam in the last 12 months, please make certain to schedule one by calling the office and schedule your medicare wellness with Maudie Mercury as soon as possible.   Yearly physical (well visit):  - Adults are recommended to be seen yearly for physicals. Check with your insurance and date of your last physical, most insurances require one calendar year between physicals. Physicals include all preventive health topics, screenings, medical exam and labs that are appropriate for gender/age and history. You may have fasting labs needed at this visit. This is a well visit (not a sick visit), new problems should not be covered during this visit (see acute visit).  - Pediatric patients are seen more frequently when they are younger. Your provider will advise you on well child visit timing that is appropriate for your their age. - This is not a medicare wellness visit. Medicare wellness exams do not have an exam portion to the visit. Some medicare companies allow for a physical, some do not allow a yearly physical. If your medicare allows a yearly physical you can schedule the medicare wellness with our nurse Maudie Mercury and have your physical with your provider after, on the same day. Please check with insurance for your full benefits.   Late Policy/No Shows:  - all new patients should arrive 15-30 minutes earlier than appointment to allow [place] time   to  obtain all personal demographics,  insurance information and for you to complete office paperwork. - All established patients should arrive 10-15 minutes earlier than appointment time to update all information and be checked in .  - In our best efforts to run on time, if you are late for your appointment you will be asked to either reschedule or if able, we will work you back into the schedule. There will be a wait time to work you back in the schedule,  depending on availability.  - If you are unable to make it to your appointment as scheduled, please call 24 hours ahead of time to allow [place] to fill the time slot with someone else who needs to be seen. If you do not cancel your appointment ahead of time, you may be charged a no show fee.

## 2020-07-16 NOTE — Progress Notes (Signed)
Patient ID: Wanda Robertson, female  DOB: 11-01-1955, 64 y.o.   MRN: [id number] Patient Care Team    Relationship Specialty Notifications Start End  Ma Hillock, DO PCP - General Family Medicine  07/16/20   Dorothy Spark, MD PCP - Cardiology Cardiology Admissions 10/15/18     Chief Complaint  Patient presents with  . Establish Care    [facility]    Subjective:  Wanda Robertson is a 64 y.o.  female present for new patient establishment. All past medical history, surgical history, allergies, family history, immunizations, medications and social history were updated in the electronic medical record today. All recent labs, ED visits and hospitalizations within the last year were reviewed.  Acquired hypothyroidism Patient reports she had been on low-dose levothyroxine that has been discontinued a few months ago. She has not had her levels rechecked since discontinuation of medication  Need for shingles vaccine Patient reports Shingrix No. 1 provided 10/05/2019. She would like to have her Shingrix No. 2 vaccine administered today despite being greater than 6 months.  Primary insomnia Patient reports she has been prescribed Ambien 12.5 mg nightly as needed for insomnia for many years. She also is prescribed Xanax 0.5 mg nightly as needed for insomnia. She states she does not take both of these at the same time typically. Depending upon her sleep pattern. She is prescribed tramadol and morphine through her pain management specialist. She states they are aware of her Ambien and benzodiazepine use. She denies any sedation with these medications.  Chronic neck pain/Chronic back pain, unspecified back location, unspecified back pain laterality/Cervicalgia Patient is prescribed morphine and tramadol through her pain management specialist.   Depression screen [facility] 2/9 01/21/2020 07/22/2018 12/20/2017 12/22/2016 06/12/2016  Decreased Interest 0 0 0 0 0  Down, Depressed, Hopeless 0 0 0 0 0  PHQ -  2 Score 0 0 0 0 0  Altered sleeping 0 - - - -  Tired, decreased energy 0 - - - -  Change in appetite 0 - - - -  Feeling bad or failure about yourself  0 - - - -  Trouble concentrating 0 - - - -  Moving slowly or fidgety/restless 0 - - - -  Suicidal thoughts 0 - - - -  PHQ-9 Score 0 - - - -   No flowsheet data found.      Fall Risk  07/22/2018 12/20/2017 12/22/2016 06/12/2016 12/13/2015  Falls in the past year? No No No No No     Immunization History  Administered Date(s) Administered  . Janssen (J&J) SARS-COV-2 Vaccination 01/16/2020  . Zoster 10/05/2019  . Zoster Recombinat (Shingrix) 07/16/2020    No exam data present  Past Medical History:  Diagnosis Date  . Abdominal hernia 04/01/2020  . Abnormal cervical Papanicolaou smear 04/01/2020  . Chicken pox   . Chronic back pain   . Chronic neck pain   . Family history of heart disease 02/25/2014  . Fibromyalgia 05/15/2013  . GERD (gastroesophageal reflux disease) 05/15/2013  . Heart murmur   . Hyperlipemia 05/15/2013  . Hypothyroidism 05/15/2013  . IBS (irritable bowel syndrome)   . Osteopenia   . Osteoporosis 05/15/2013   History of past treatment with Forteo   . Raynaud's disease 02/25/2014   Allergies  Allergen Reactions  . Compazine [Prochlorperazine Edisylate] Other (See Comments)    Severe muscle requiring ER visit  . Penicillins Hives  . Prochlorperazine   . Sulfamethoxazole-Trimethoprim   . Sulfa Antibiotics  Rash   Past Surgical History:  Procedure Laterality Date  . COLONOSCOPY    . TONSILLECTOMY     Family History  Problem Relation Age of Onset  . Heart disease Mother   . Arthritis Mother   . Early death Mother 29  . Heart attack Mother   . Heart disease Father   . Colon cancer Father   . Hyperlipidemia Father   . Hypertension Father   . Heart disease Other        family history   . Brain cancer Sister   . Early death Sister 72  . Liver cancer Brother   . Early death Brother 84  . Kidney disease  Paternal Grandmother   . Hypertension Paternal Grandfather   . Heart disease Brother   . Heart attack Brother   . Esophageal cancer Neg Hx   . Pancreatic cancer Neg Hx   . Stomach cancer Neg Hx    Social History   Social History Narrative  . Not on file    Allergies as of 07/16/2020      Reactions   Compazine [prochlorperazine Edisylate] Other (See Comments)   Severe muscle requiring ER visit   Penicillins Hives   Prochlorperazine    Sulfamethoxazole-trimethoprim    Sulfa Antibiotics Rash      Medication List       Accurate as of July 16, 2020 11:59 PM. If you have any questions, ask your nurse or doctor.        STOP taking these medications   carisoprodol 350 MG tablet Commonly known as: SOMA Stopped by: Howard Pouch, DO   ondansetron 4 MG tablet Commonly known as: ZOFRAN Stopped by: Howard Pouch, DO   Suprep Bowel Prep Kit 17.5-3.13-1.6 GM/177ML Soln Generic drug: Na Sulfate-K Sulfate-Mg Sulf Stopped by: Howard Pouch, DO     TAKE these medications   ALPRAZolam 0.5 MG tablet Commonly known as: XANAX Take 1 tablet (0.5 mg total) by mouth at bedtime as needed.   Biotin 1000 MCG tablet Take 1,000 mcg by mouth daily.   Calcium Plus Vitamin D 300-100 MG-UNIT Caps Generic drug: Calcium Carbonate-Vitamin D Take 4 tablets by mouth daily.   CENTRUM SILVER PO Take 1 tablet by mouth daily.   clobetasol 0.05 % Gel Commonly known as: TEMOVATE Apply to affected area BID PRN   CO Q 10 PO Take 1 tablet by mouth daily.   estradiol 0.05 MG/24HR patch Commonly known as: VIVELLE-DOT estradiol 0.05 mg/24 hr semiweekly transdermal patch  APPLY 1 PATCH TWICE A WEEK   KRILL OIL PO Take 1 tablet by mouth daily.   morphine 50 MG 24 hr capsule Commonly known as: KADIAN Take 50 mg by mouth 2 (two) times daily.   rosuvastatin 5 MG tablet Commonly known as: CRESTOR Take 1 tablet (5 mg total) by mouth daily.   traMADol 50 MG tablet Commonly known as:  ULTRAM Take 50 mg by mouth 2 (two) times daily.   UNABLE TO FIND Med Name: NitroBID cream - from rheumotology - topical for hands   VITAMIN B 12 PO Take 1 tablet by mouth daily.   zolpidem 12.5 MG CR tablet Commonly known as: AMBIEN CR Take 1 tablet (12.5 mg total) by mouth at bedtime as needed for sleep.       All past medical history, surgical history, allergies, family history, immunizations andmedications were updated in the EMR today and reviewed under the history and medication portions of their EMR.  ROS: 14 pt review of systems performed and negative (unless mentioned in an HPI)  Objective: BP 120/75   Pulse 86   Temp 98 F (36.7 C) (Oral)   Ht '5\' 4"'  (1.626 m)   Wt 104 lb (47.2 kg)   SpO2 100%   BMI 17.85 kg/m  Gen: Afebrile. No acute distress. Nontoxic in appearance, well-developed, well-nourished, pleasant, thin female HENT: AT. [place]. No cough or hoarseness on exam Eyes:Pupils Equal Round Reactive to light, Extraocular movements intact,  Conjunctiva without redness, discharge or icterus. Neck/lymp/endocrine: Supple, no lymphadenopathy, no thyromegaly CV: RRR no murmur, no edema, +2/4 P posterior tibialis pulses.  Chest: CTAB, no wheeze, rhonchi or crackles. Normal respiratory effort. Good air movement. Skin:Warm and well-perfused. Skin intact. Neuro/Msk: Normal gait. PERLA. EOMi. Alert. Oriented x3.   Psych: Normal affect, dress and demeanor. Normal speech. Normal thought content and judgment.   Assessment/plan: Wanda Robertson is a 64 y.o. female present for  New patient establishment Acquired hypothyroidism Patient reports she had been on very low-dose levothyroxine which has been discontinued a few months ago. We will repeat thyroid panel today to ensure levels were able to maintain without supplementation. - TSH - T3, free - T4, free  High risk medication use - Comp Met (CMET)  Primary insomnia Stable. Discussed Ambien use with her today. She is  on multiple sedating medications. She denies any sedation and reports her pain management team are aware of her Ambien and her benzodiazepine use. She reports she typically does not use the benzodiazepine and the Ambien at the same time. Continue Xanax 0.5 mg nightly as needed Continue zolpidem (AMBIEN CR) 12.5 MG CR tablet [place] controlled substance database was reviewed.  Chronic neck pain/Chronic back pain, unspecified back location, unspecified back pain laterality/Cervicalgia Patient is prescribed tramadol and [place] through her pain management team.  Shingrix No. 2 vaccine provided today.  *Return in about 6 months (around 01/10/2021) for [place] (30 min).  Orders Placed This Encounter  Procedures  . Varicella-zoster vaccine IM  . TSH  . T3, free  . T4, free  . Comp Met (CMET)   Meds ordered this encounter  Medications  . zolpidem (AMBIEN CR) 12.5 MG CR tablet    Sig: Take 1 tablet (12.5 mg total) by mouth at bedtime as needed for sleep.    Dispense:  90 tablet    Refill:  1    Discontinue any prior scripts for ambien on file please.  Marland Kitchen ALPRAZolam (XANAX) 0.5 MG tablet    Sig: Take 1 tablet (0.5 mg total) by mouth at bedtime as needed.    Dispense:  90 tablet    Refill:  1   Referral Orders  No referral(s) requested today    > 45 Minutes was dedicated to this patient's encounter to include pre-visit review of chart, face-to-face time with patient and post-visit work- which include documentation and prescribing medications and/or ordering test when necessary.     Note is dictated utilizing voice recognition software. Although note has been proof read prior to signing, occasional typographical errors still can be missed. If any questions arise, please do not hesitate to call for verification.  Electronically signed by: Howard Pouch, DO [place]

## 2020-07-21 ENCOUNTER — Encounter: Payer: Self-pay | Admitting: Family Medicine

## 2020-07-21 DIAGNOSIS — N812 Incomplete uterovaginal prolapse: Secondary | ICD-10-CM | POA: Diagnosis not present

## 2020-07-22 DIAGNOSIS — N819 Female genital prolapse, unspecified: Secondary | ICD-10-CM | POA: Diagnosis not present

## 2020-08-03 DIAGNOSIS — N8110 Cystocele, unspecified: Secondary | ICD-10-CM | POA: Diagnosis not present

## 2020-08-03 DIAGNOSIS — N812 Incomplete uterovaginal prolapse: Secondary | ICD-10-CM | POA: Diagnosis not present

## 2020-08-30 DIAGNOSIS — G894 Chronic pain syndrome: Secondary | ICD-10-CM | POA: Diagnosis not present

## 2020-08-30 DIAGNOSIS — M797 Fibromyalgia: Secondary | ICD-10-CM | POA: Diagnosis not present

## 2020-08-30 DIAGNOSIS — M47812 Spondylosis without myelopathy or radiculopathy, cervical region: Secondary | ICD-10-CM | POA: Diagnosis not present

## 2020-08-30 DIAGNOSIS — Z79891 Long term (current) use of opiate analgesic: Secondary | ICD-10-CM | POA: Diagnosis not present

## 2020-09-13 ENCOUNTER — Encounter: Payer: Self-pay | Admitting: Family Medicine

## 2020-09-13 DIAGNOSIS — U071 COVID-19: Secondary | ICD-10-CM | POA: Diagnosis not present

## 2020-09-13 NOTE — Telephone Encounter (Signed)
Patient returned call. I relayed Dr. Lucita Lora message below. Patient will get tested today at 3:00. I encouraged her to get the regular Covid test and she was not sure which one [facility] is offering. If it comes back positive, she will call back to move forward with the infusion treatment.

## 2020-09-13 NOTE — Telephone Encounter (Signed)
LVM for pt to CB and sched an appt. Pt could be sched at 4 pm slot per [facility] for further tx

## 2020-09-13 NOTE — Telephone Encounter (Signed)
I would recommend she go through with the testing today at [facility] at [facility].  I would encourage her a Covid test that takes 24 hours to read, and not the rapid test.  Since she is starting to have symptoms-she most likely does have Covid.  The monoclonal injection is for prevention-not symptomatic patients. If she does have a positive Covid test we can consider getting her set up for the infusion treatment for Covid.  Therefore, I would encourage her to have the test completed and schedule an appointment with [place] tomorrow afternoon virtually.

## 2020-09-13 NOTE — Telephone Encounter (Signed)
LVM for pt to CB regarding sx

## 2020-09-13 NOTE — Telephone Encounter (Signed)
Please advise if pt should proceed with testing. Pt started having sx yesterday and tested neg. Could it be too soon?

## 2020-09-14 ENCOUNTER — Encounter: Payer: Self-pay | Admitting: Family Medicine

## 2020-09-14 ENCOUNTER — Telehealth: Payer: BC Managed Care – PPO | Admitting: Family Medicine

## 2020-09-14 DIAGNOSIS — U071 COVID-19: Secondary | ICD-10-CM

## 2020-09-14 MED ORDER — BENZONATATE 200 MG PO CAPS: 200.0000 mg | ORAL_CAPSULE | Freq: Two times a day (BID) | ORAL | 0 refills | Status: DC | PRN

## 2020-09-14 NOTE — Patient Instructions (Signed)
COVID-19 COVID-19 is a respiratory infection that is caused by a virus called severe acute respiratory syndrome coronavirus 2 (SARS-CoV-2). The disease is also known as coronavirus disease or novel coronavirus. In some people, the virus may not cause any symptoms. In others, it may cause a serious infection. The infection can get worse quickly and can lead to complications, such as:  Pneumonia, or infection of the lungs.  Acute respiratory distress syndrome or ARDS. This is a condition in which fluid build-up in the lungs prevents the lungs from filling with air and passing oxygen into the blood.  Acute respiratory failure. This is a condition in which there is not enough oxygen passing from the lungs to the body or when carbon dioxide is not passing from the lungs out of the body.  Sepsis or septic shock. This is a serious bodily reaction to an infection.  Blood clotting problems.  Secondary infections due to bacteria or fungus.  Organ failure. This is when your body's organs stop working. The virus that causes COVID-19 is contagious. This means that it can spread from person to person through droplets from coughs and sneezes (respiratory secretions). What are the causes? This illness is caused by a virus. You may catch the virus by:  Breathing in droplets from an infected person. Droplets can be spread by a person breathing, speaking, singing, coughing, or sneezing.  Touching something, like a table or a doorknob, that was exposed to the virus (contaminated) and then touching your mouth, nose, or eyes. What increases the risk? Risk for infection You are more likely to be infected with this virus if you:  Are within 6 feet (2 meters) of a person with COVID-19.  Provide care for or live with a person who is infected with COVID-19.  Spend time in crowded indoor spaces or live in shared housing. Risk for serious illness You are more likely to become seriously ill from the virus if you:   Are 50 years of age or older. The higher your age, the more you are at risk for serious illness.  Live in a nursing home or long-term care facility.  Have cancer.  Have a long-term (chronic) disease such as: ? Chronic lung disease, including chronic obstructive pulmonary disease or asthma. ? A long-term disease that lowers your body's ability to fight infection (immunocompromised). ? Heart disease, including heart failure, a condition in which the arteries that lead to the heart become narrow or blocked (coronary artery disease), a disease which makes the heart muscle thick, weak, or stiff (cardiomyopathy). ? Diabetes. ? Chronic kidney disease. ? Sickle cell disease, a condition in which red blood cells have an abnormal "sickle" shape. ? Liver disease.  Are obese. What are the signs or symptoms? Symptoms of this condition can range from mild to severe. Symptoms may appear any time from 2 to 14 days after being exposed to the virus. They include:  A fever or chills.  A cough.  Difficulty breathing.  Headaches, body aches, or muscle aches.  Runny or stuffy (congested) nose.  A sore throat.  New loss of taste or smell. Some people may also have stomach problems, such as nausea, vomiting, or diarrhea. Other people may not have any symptoms of COVID-19. How is this diagnosed? This condition may be diagnosed based on:  Your signs and symptoms, especially if: ? You live in an area with a COVID-19 outbreak. ? You recently traveled to or from an area where the virus is common. ? You   provide care for or live with a person who was diagnosed with COVID-19. ? You were exposed to a person who was diagnosed with COVID-19.  A physical exam.  Lab tests, which may include: ? Taking a sample of fluid from the back of your nose and throat (nasopharyngeal fluid), your nose, or your throat using a swab. ? A sample of mucus from your lungs (sputum). ? Blood tests.  Imaging tests, which  may include, X-rays, CT scan, or ultrasound. How is this treated? At present, there is no medicine to treat COVID-19. Medicines that treat other diseases are being used on a trial basis to see if they are effective against COVID-19. Your health care provider will talk with you about ways to treat your symptoms. For most people, the infection is mild and can be managed at home with rest, fluids, and over-the-counter medicines. Treatment for a serious infection usually takes places in a hospital intensive care unit (ICU). It may include one or more of the following treatments. These treatments are given until your symptoms improve.  Receiving fluids and medicines through an IV.  Supplemental oxygen. Extra oxygen is given through a tube in the nose, a face mask, or a hood.  Positioning you to lie on your stomach (prone position). This makes it easier for oxygen to get into the lungs.  Continuous positive airway pressure (CPAP) or bi-level positive airway pressure (BPAP) machine. This treatment uses mild air pressure to keep the airways open. A tube that is connected to a motor delivers oxygen to the body.  Ventilator. This treatment moves air into and out of the lungs by using a tube that is placed in your windpipe.  Tracheostomy. This is a procedure to create a hole in the neck so that a breathing tube can be inserted.  Extracorporeal membrane oxygenation (ECMO). This procedure gives the lungs a chance to recover by taking over the functions of the heart and lungs. It supplies oxygen to the body and removes carbon dioxide. Follow these instructions at home: Lifestyle  If you are sick, stay home except to get medical care. Your health care provider will tell you how long to stay home. Call your health care provider before you go for medical care.  Rest at home as told by your health care provider.  Do not use any products that contain nicotine or tobacco, such as cigarettes, e-cigarettes, and  chewing tobacco. If you need help quitting, ask your health care provider.  Return to your normal activities as told by your health care provider. Ask your health care provider what activities are safe for you. General instructions  Take over-the-counter and prescription medicines only as told by your health care provider.  Drink enough fluid to keep your urine pale yellow.  Keep all follow-up visits as told by your health care provider. This is important. How is this prevented?  There is no vaccine to help prevent COVID-19 infection. However, there are steps you can take to protect yourself and others from this virus. To protect yourself:   Do not travel to areas where COVID-19 is a risk. The areas where COVID-19 is reported change often. To identify high-risk areas and travel restrictions, check the CDC travel website: wwwnc.cdc.gov/travel/notices  If you live in, or must travel to, an area where COVID-19 is a risk, take precautions to avoid infection. ? Stay away from people who are sick. ? Wash your hands often with soap and water for 20 seconds. If soap and water   are not available, use an alcohol-based hand sanitizer. ? Avoid touching your mouth, face, eyes, or nose. ? Avoid going out in public, follow guidance from your state and local health authorities. ? If you must go out in public, wear a cloth face covering or face mask. Make sure your mask covers your nose and mouth. ? Avoid crowded indoor spaces. Stay at least 6 feet (2 meters) away from others. ? Disinfect objects and surfaces that are frequently touched every day. This may include:  Counters and tables.  Doorknobs and light switches.  Sinks and faucets.  Electronics, such as phones, remote controls, keyboards, computers, and tablets. To protect others: If you have symptoms of COVID-19, take steps to prevent the virus from spreading to others.  If you think you have a COVID-19 infection, contact your health care  provider right away. Tell your health care team that you think you may have a COVID-19 infection.  Stay home. Leave your house only to seek medical care. Do not use public transport.  Do not travel while you are sick.  Wash your hands often with soap and water for 20 seconds. If soap and water are not available, use alcohol-based hand sanitizer.  Stay away from other members of your household. Let healthy household members care for children and pets, if possible. If you have to care for children or pets, wash your hands often and wear a mask. If possible, stay in your own room, separate from others. Use a different bathroom.  Make sure that all people in your household wash their hands well and often.  Cough or sneeze into a tissue or your sleeve or elbow. Do not cough or sneeze into your hand or into the air.  Wear a cloth face covering or face mask. Make sure your mask covers your nose and mouth. Where to find more information  Centers for Disease Control and Prevention: www.cdc.gov/coronavirus/2019-ncov/index.html  World Health Organization: www.who.int/health-topics/coronavirus Contact a health care provider if:  You live in or have traveled to an area where COVID-19 is a risk and you have symptoms of the infection.  You have had contact with someone who has COVID-19 and you have symptoms of the infection. Get help right away if:  You have trouble breathing.  You have pain or pressure in your chest.  You have confusion.  You have bluish lips and fingernails.  You have difficulty waking from sleep.  You have symptoms that get worse. These symptoms may represent a serious problem that is an emergency. Do not wait to see if the symptoms will go away. Get medical help right away. Call your local emergency services (911 in the [place] Do not drive yourself to the hospital. Let the emergency medical personnel know if you think you have COVID-19. Summary  COVID-19 is a  respiratory infection that is caused by a virus. It is also known as coronavirus disease or novel coronavirus. It can cause serious infections, such as pneumonia, acute respiratory distress syndrome, acute respiratory failure, or sepsis.  The virus that causes COVID-19 is contagious. This means that it can spread from person to person through droplets from breathing, speaking, singing, coughing, or sneezing.  You are more likely to develop a serious illness if you are 50 years of age or older, have a weak immune system, live in a nursing home, or have chronic disease.  There is no medicine to treat COVID-19. Your health care provider will talk with you about ways to treat your symptoms.    Take steps to protect yourself and others from infection. Wash your hands often and disinfect objects and surfaces that are frequently touched every day. Stay away from people who are sick and wear a mask if you are sick. This information is not intended to replace advice given to you by your health care provider. Make sure you discuss any questions you have with your health care provider. Document Revised: 08/01/2019 Document Reviewed: 11/07/2018 [employer]vier Patient Education  2020 [employer]  

## 2020-09-14 NOTE — Progress Notes (Signed)
VIRTUAL VISIT VIA VIDEO  I connected with Floydene Flock Ungerer on 09/14/20 at  2:30 PM EST by elemedicine application and verified that I am speaking with the correct person using two identifiers. Location patient: Home Location provider: [facility], Office Persons participating in the virtual visit: Patient, Dr. Raoul Pitch and Samul Dada, CMA  I discussed the limitations of evaluation and management by telemedicine and the availability of in person appointments. The patient expressed understanding and agreed to proceed.   SUBJECTIVE Chief Complaint  Patient presents with  . Cough    pt c/o cough headache, chill, body aches; pt sx started 11/28 and tested pos 11/29    HPI: WINDA SUMMERALL is a 64 y.o. for acute covid illness. Sx onset 09/12/2020 and positive test 09/13/2020. Her husband also diagnosed w/ covid this week. She reports dry cough, headache, chills, body aches. She denies known fever, shortness of breath, nausea, vomit or GI sx. She has not lost sense of taste or smell. She had her J&J vaccine 7.5 months ago.   ROS: See pertinent positives and negatives per HPI.  Patient Active Problem List   Diagnosis Date Noted  . Family history of early CAD 09/17/2017  . Abnormal EKG 09/17/2017  . Abnormal cardiac CT angiography 09/17/2017  . Osteopenia   . IBS (irritable bowel syndrome)   . Chronic neck pain   . Chronic back pain   . 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 Family history of heart disease 02/25/2014  . Raynaud's disease 02/25/2014  . Hyperlipemia 05/15/2013  . GERD (gastroesophageal reflux disease) 05/15/2013  . Fibromyalgia 05/15/2013  . Cervicalgia 05/15/2013  . Osteoporosis 05/15/2013  . Post menopausal syndrome 08/23/2011  . Post-menopause on HRT (hormone replacement therapy) 08/23/2011    Social History   Tobacco Use  . Smoking status: Former Research scientist (life sciences)  . Smokeless tobacco: Never Used  . Tobacco comment: socially  Substance Use Topics  . Alcohol use:  No    Current Outpatient Medications:  .  ALPRAZolam (XANAX) 0.5 MG tablet, Take 1 tablet (0.5 mg total) by mouth at bedtime as needed., Disp: 90 tablet, Rfl: 1 .  Biotin 1000 MCG tablet, Take 1,000 mcg by mouth daily. , Disp: , Rfl:  .  Calcium Carbonate-Vitamin D (CALCIUM PLUS VITAMIN D) 300-100 MG-UNIT CAPS, Take 4 tablets by mouth daily., Disp: , Rfl:  .  clobetasol (TEMOVATE) 0.05 % GEL, Apply to affected area BID PRN, Disp: 30 each, Rfl: 3 .  Coenzyme Q10 (CO Q 10 PO), Take 1 tablet by mouth daily. , Disp: , Rfl:  .  Cyanocobalamin (VITAMIN B 12 PO), Take 1 tablet by mouth daily. , Disp: , Rfl:  .  estradiol (VIVELLE-DOT) 0.05 MG/24HR patch, estradiol 0.05 mg/24 hr semiweekly transdermal patch  APPLY 1 PATCH TWICE A WEEK, Disp: , Rfl:  .  KRILL OIL PO, Take 1 tablet by mouth daily. , Disp: , Rfl:  .  morphine (KADIAN) 50 MG 24 hr capsule, Take 50 mg by mouth 2 (two) times daily. , Disp: , Rfl:  .  Multiple Vitamins-Minerals (CENTRUM SILVER PO), Take 1 tablet by mouth daily., Disp: , Rfl:  .  rosuvastatin (CRESTOR) 5 MG tablet, Take 1 tablet (5 mg total) by mouth daily., Disp: 90 tablet, Rfl: 1 .  traMADol (ULTRAM) 50 MG tablet, Take 50 mg by mouth 2 (two) times daily., Disp: , Rfl:  .  UNABLE TO FIND, Med Name: NitroBID cream - from rheumotology - topical for hands, Disp: ,  Rfl:  .  zolpidem (AMBIEN CR) 12.5 MG CR tablet, Take 1 tablet (12.5 mg total) by mouth at bedtime as needed for sleep., Disp: 90 tablet, Rfl: 1 .  benzonatate (TESSALON) 200 MG capsule, Take 1 capsule (200 mg total) by mouth 2 (two) times daily as needed for cough., Disp: 20 capsule, Rfl: 0  Allergies  Allergen Reactions  . Compazine [Prochlorperazine Edisylate] Other (See Comments)    Severe muscle requiring ER visit  . Penicillins Hives  . Prochlorperazine   . Sulfamethoxazole-Trimethoprim   . Sulfa Antibiotics Rash    OBJECTIVE: There were no vitals taken for this visit. Gen: No acute distress. Nontoxic  in appearance.  HENT: AT. [place].  MMM.  Eyes:Pupils Equal Round Reactive to light, Extraocular movements intact,  Conjunctiva without redness, discharge or icterus. Chest: Cough not present on exam. No shortness of breath. Skin: no rashes, purpura or petechiae.  Neuro: Normal gait. Alert. Oriented x3  Psych: Normal affect and demeanor. Normal speech. Normal thought content and judgment.  ASSESSMENT AND PLAN: MINERVIA OSSO is a 64 y.o. female present for  COVID-19 virus infection Rest, hydrate.  Start  mucinex (DM if cough) OTC Tessalon perles called in for cough.  Self isolate 14 days from onset of sx and fever free for 48 hours.  Referred to infusion clinic for treatment (husband also referred for his +covid test).  F/U 2 weeks of not improved- sooner if worsening.   Howard Pouch, DO 09/14/2020   Return in about 1 week (around 09/21/2020), or if symptoms worsen or fail to improve.  No orders of the defined types were placed in this encounter.  Meds ordered this encounter  Medications  . benzonatate (TESSALON) 200 MG capsule    Sig: Take 1 capsule (200 mg total) by mouth 2 (two) times daily as needed for cough.    Dispense:  20 capsule    Refill:  0   Referral Orders  No referral(s) requested today

## 2020-09-15 ENCOUNTER — Telehealth: Payer: Self-pay | Admitting: Unknown Physician Specialty

## 2020-09-15 ENCOUNTER — Other Ambulatory Visit: Payer: Self-pay | Admitting: Unknown Physician Specialty

## 2020-09-15 ENCOUNTER — Encounter: Payer: Self-pay | Admitting: Unknown Physician Specialty

## 2020-09-15 ENCOUNTER — Telehealth: Payer: Self-pay | Admitting: Family Medicine

## 2020-09-15 DIAGNOSIS — M797 Fibromyalgia: Secondary | ICD-10-CM

## 2020-09-15 DIAGNOSIS — U071 COVID-19: Secondary | ICD-10-CM

## 2020-09-15 NOTE — Telephone Encounter (Signed)
Patient states at her last visit, Dr. Raoul Pitch referred her and her husband to receive infusion therapy for her Covid. They have not been called to schedule and I was unable to find orders or referral in patients chart. Please call patient to advise.

## 2020-09-15 NOTE — Telephone Encounter (Signed)
I connected by phone with Wanda Robertson on 09/15/2020 at 3:21 PM to discuss the potential use of a new treatment for mild to moderate COVID-19 viral infection in non-hospitalized patients.  This patient is a 64 y.o. female that meets the FDA criteria for Emergency Use Authorization of COVID monoclonal antibody casirivimab/imdevimab, bamlanivimab/eteseviamb, or sotrovimab.  Has a (+) direct SARS-CoV-2 viral test result  Has mild or moderate COVID-19   Is NOT hospitalized due to COVID-19  Is within 10 days of symptom onset  Has at least one of the high risk factor(s) for progression to severe COVID-19 and/or hospitalization as defined in EUA. Specific high risk criteria : Fibromyalgia  I have spoken and communicated the following to the patient or parent/caregiver regarding COVID monoclonal antibody treatment:  1. FDA has authorized the emergency use for the treatment of mild to moderate COVID-19 in adults and pediatric patients with positive results of direct SARS-CoV-2 viral testing who are 81 years of age and older weighing at least 40 kg, and who are at high risk for progressing to severe COVID-19 and/or hospitalization.  2. The significant known and potential risks and benefits of COVID monoclonal antibody, and the extent to which such potential risks and benefits are unknown.  3. Information on available alternative treatments and the risks and benefits of those alternatives, including clinical trials.  4. Patients treated with COVID monoclonal antibody should continue to self-isolate and use infection control measures (e.g., wear mask, isolate, social distance, avoid sharing personal items, clean and disinfect "high touch" surfaces, and frequent handwashing) according to CDC guidelines.   5. The patient or parent/caregiver has the option to accept or refuse COVID monoclonal antibody treatment.  After reviewing this information with the patient, the patient has agreed to receive one  of the available covid 19 monoclonal antibodies and will be provided an appropriate fact sheet prior to infusion. Kathrine Haddock, NP 09/15/2020 3:21 PM  Sx onset 11/28

## 2020-09-15 NOTE — Telephone Encounter (Signed)
Patient has been contacted since phone note to be set up for infusion. Nothing further needed.

## 2020-09-15 NOTE — Telephone Encounter (Signed)
Nothing further is needed. Patient was under the impression she would be contacted the same day referral was entered. Miscommunication regarding infusion clinic process.

## 2020-09-16 ENCOUNTER — Ambulatory Visit (HOSPITAL_COMMUNITY)
Admission: RE | Admit: 2020-09-16 | Discharge: 2020-09-16 | Disposition: A | Discharge status: Home / Self Care | Payer: BC Managed Care – PPO | Source: Ambulatory Visit | Attending: Pulmonary Disease | Admitting: Pulmonary Disease

## 2020-09-16 DIAGNOSIS — M797 Fibromyalgia: Secondary | ICD-10-CM

## 2020-09-16 DIAGNOSIS — U071 COVID-19: Secondary | ICD-10-CM | POA: Insufficient documentation

## 2020-09-16 MED ORDER — FAMOTIDINE IN NACL 20-0.9 MG/50ML-% IV SOLN: 20.0000 mg | Freq: Once | INTRAVENOUS | Status: DC | PRN

## 2020-09-16 MED ORDER — SOTROVIMAB 500 MG/8ML IV SOLN
500.0000 mg | Freq: Once | INTRAVENOUS | Status: AC
  Administered 2020-09-16: 500 mg via INTRAVENOUS

## 2020-09-16 MED ORDER — DIPHENHYDRAMINE HCL 50 MG/ML IJ SOLN: 50.0000 mg | Freq: Once | INTRAMUSCULAR | Status: DC | PRN

## 2020-09-16 MED ORDER — EPINEPHRINE 0.3 MG/0.3ML IJ SOAJ: 0.3000 mg | Freq: Once | INTRAMUSCULAR | Status: DC | PRN

## 2020-09-16 MED ORDER — ALBUTEROL SULFATE HFA 108 (90 BASE) MCG/ACT IN AERS: 2.0000 | INHALATION_SPRAY | Freq: Once | RESPIRATORY_TRACT | Status: DC | PRN

## 2020-09-16 MED ORDER — METHYLPREDNISOLONE SODIUM SUCC 125 MG IJ SOLR: 125.0000 mg | Freq: Once | INTRAMUSCULAR | Status: DC | PRN

## 2020-09-16 MED ORDER — SODIUM CHLORIDE 0.9 % IV SOLN: INTRAVENOUS | Status: DC | PRN

## 2020-09-16 NOTE — Progress Notes (Signed)
Patient reviewed Fact Sheet for Patients, Parents, and Caregivers for Emergency Use Authorization (EUA) of Sotrovimab for the Treatment of Coronavirus. Patient also reviewed and is agreeable to the estimated cost of treatment. Patient is agreeable to proceed.   

## 2020-09-16 NOTE — Discharge Instructions (Signed)
10 Things You Can Do to Manage Your COVID-19 Symptoms at Home If you have possible or confirmed COVID-19: 1. Stay home from work and school. And stay away from other public places. If you must go out, avoid using any kind of public transportation, ridesharing, or taxis. 2. Monitor your symptoms carefully. If your symptoms get worse, call your healthcare provider immediately. 3. Get rest and stay hydrated. 4. If you have a medical appointment, call the healthcare provider ahead of time and tell them that you have or may have COVID-19. 5. For medical emergencies, call 911 and notify the dispatch personnel that you have or may have COVID-19. 6. Cover your cough and sneezes with a tissue or use the inside of your elbow. 7. Wash your hands often with soap and water for at least 20 seconds or clean your hands with an alcohol-based hand sanitizer that contains at least 60% alcohol. 8. As much as possible, stay in a specific room and away from other people in your home. Also, you should use a separate bathroom, if available. If you need to be around other people in or outside of the home, wear a mask. 9. Avoid sharing personal items with other people in your household, like dishes, towels, and bedding. 10. Clean all surfaces that are touched often, like counters, tabletops, and doorknobs. Use household cleaning sprays or wipes according to the label instructions. cdc.gov/coronavirus 04/16/2019 This information is not intended to replace advice given to you by your health care provider. Make sure you discuss any questions you have with your health care provider. Document Revised: 09/18/2019 Document Reviewed: 09/18/2019 Elsevier Patient Education  2020 Elsevier Inc. What types of side effects do monoclonal antibody drugs cause?  Common side effects  In general, the more common side effects caused by monoclonal antibody drugs include: . Allergic reactions, such as hives or itching . Flu-like signs and  symptoms, including chills, fatigue, fever, and muscle aches and pains . Nausea, vomiting . Diarrhea . Skin rashes . Low blood pressure   The CDC is recommending patients who receive monoclonal antibody treatments wait at least 90 days before being vaccinated.  Currently, there are no data on the safety and efficacy of mRNA COVID-19 vaccines in persons who received monoclonal antibodies or convalescent plasma as part of COVID-19 treatment. Based on the estimated half-life of such therapies as well as evidence suggesting that reinfection is uncommon in the 90 days after initial infection, vaccination should be deferred for at least 90 days, as a precautionary measure until additional information becomes available, to avoid interference of the antibody treatment with vaccine-induced immune responses. If you have any questions or concerns after the infusion please call the Advanced Practice Provider on call at [phone]. This number is ONLY intended for your use regarding questions or concerns about the infusion post-treatment side-effects.  Please do not provide this number to others for use. For return to work notes please contact your primary care provider.   If someone you know is interested in receiving treatment please have them call the COVID hotline at [phone].   

## 2020-09-16 NOTE — Progress Notes (Signed)
Diagnosis: COVID-19  Physician: Dr. Patrick Wright  Procedure: Covid Infusion Clinic Med: Sotrovimab infusion - Provided patient with sotrovimab fact sheet for patients, parents, and caregivers prior to infusion.   Complications: No immediate complications noted  Discharge: Discharged home    

## 2020-10-18 ENCOUNTER — Encounter: Payer: Self-pay | Admitting: Cardiology

## 2020-10-18 ENCOUNTER — Other Ambulatory Visit: Payer: Self-pay

## 2020-10-18 ENCOUNTER — Ambulatory Visit (INDEPENDENT_AMBULATORY_CARE_PROVIDER_SITE_OTHER): Payer: BC Managed Care – PPO | Admitting: Cardiology

## 2020-10-18 VITALS — BP 118/70 | HR 95 | Ht 64.0 in | Wt 105.0 lb

## 2020-10-18 DIAGNOSIS — Z789 Other specified health status: Secondary | ICD-10-CM

## 2020-10-18 DIAGNOSIS — I491 Atrial premature depolarization: Secondary | ICD-10-CM | POA: Diagnosis not present

## 2020-10-18 DIAGNOSIS — Z8249 Family history of ischemic heart disease and other diseases of the circulatory system: Secondary | ICD-10-CM | POA: Diagnosis not present

## 2020-10-18 DIAGNOSIS — R931 Abnormal findings on diagnostic imaging of heart and coronary circulation: Secondary | ICD-10-CM

## 2020-10-18 DIAGNOSIS — E782 Mixed hyperlipidemia: Secondary | ICD-10-CM | POA: Diagnosis not present

## 2020-10-18 NOTE — Patient Instructions (Signed)
Medication Instructions:   Your physician recommends that you continue on your current medications as directed. Please refer to the Current Medication list given to you today.  *If you need a refill on your cardiac medications before your next appointment, please call your pharmacy*   Lab Work:  TODAY--CMET, CBC, TSH, AND LIPIDS  If you have labs (blood work) drawn today and your tests are completely normal, you will receive your results only by: Marland Kitchen MyChart Message (if you have MyChart) OR . A paper copy in the mail If you have any lab test that is abnormal or we need to change your treatment, we will call you to review the results.   You have been referred to OUR LIPID CLINIC HERE TO SEE OUR PHARMACIST   Follow-Up: At [facility], you and your health needs are our priority.  As part of our continuing mission to provide you with exceptional heart care, we have created designated Provider Care Teams.  These Care Teams include your primary Cardiologist (physician) and Advanced Practice Providers (APPs -  Physician Assistants and Nurse Practitioners) who all work together to provide you with the care you need, when you need it.  We recommend signing up for the patient portal called "MyChart".  Sign up information is provided on this After Visit Summary.  MyChart is used to connect with patients for Virtual Visits (Telemedicine).  Patients are able to view lab/test results, encounter notes, upcoming appointments, etc.  Non-urgent messages can be sent to your provider as well.   To learn more about what you can do with MyChart, go to [email or web address].    Your next appointment:   6 month(s)  The format for your next appointment:   In Person  Provider:   Tobias Alexander, MD

## 2020-10-18 NOTE — Progress Notes (Signed)
Cardiology Office Note:    Date:  10/19/2020   ID:  Wanda Robertson, DOB [birth year], MRN [id number]  PCP:  Ma Hillock, DO  [place] Cardiologist:  Ena Dawley, MD  [place] Electrophysiologist:  None   Referring MD: Baruch Gouty, FNP   Reason for visit: 4 months follow-up  History of Present Illness:    Wanda Robertson is a 65 y.o. female who was previously followed by Dr Aundra Dubin as a preventive cardiology. She is a younger appearing female with very healthy weight and diet - vegan for the last 20 years and moderate amount of exercise including yoga. She denies any symptoms of chest pain, SOB, LE edema, orthopnea, PND.  She has a very significant FH of early CAD, her mother had MI in her 35' and died of another MI in early 103'. Her brother had a stent placed at age 32, still living in his 28'.  She had a calcium score done in 2015 that was 20.25 Agatston units. This was 78th percentile for age and sex matched control.  Her repeat calcium scoring performed in August 2021 was 503 that represents 97 percentile for age/sex. She was started on Crestor 5 mg daily that she is tolerating well.  She also underwent 7-day Zio patch monitoring for palpitations that showed 2 very short runs of SVT with the longest lasting 12 beats. She continues to be very active exercising almost daily, she continues to eat very healthy diet and denies any symptoms of chest pain or shortness of breath.  She also denies palpitations, dizziness or syncope.  Past Medical History:  Diagnosis Date  . Abdominal hernia 04/01/2020  . Abnormal cervical Papanicolaou smear 04/01/2020  . Chicken pox   . Chronic back pain   . Chronic neck pain   . Family history of heart disease 02/25/2014  . Fibromyalgia 05/15/2013  . GERD (gastroesophageal reflux disease) 05/15/2013  . Heart murmur   . Hyperlipemia 05/15/2013  . Hypothyroidism 05/15/2013  . IBS (irritable bowel syndrome)   . Osteopenia   . Osteoporosis  05/15/2013   History of past treatment with Forteo   . Raynaud's disease 02/25/2014    Past Surgical History:  Procedure Laterality Date  . COLONOSCOPY    . TONSILLECTOMY      Current Medications: Current Meds  Medication Sig  . ALPRAZolam (XANAX) 0.5 MG tablet Take 1 tablet (0.5 mg total) by mouth at bedtime as needed.  . Biotin 1000 MCG tablet Take 1,000 mcg by mouth daily.   . Calcium Carbonate-Vitamin D 300-100 MG-UNIT CAPS Take 4 tablets by mouth daily.  . clobetasol (TEMOVATE) 0.05 % GEL Apply to affected area BID PRN  . Coenzyme Q10 (CO Q 10 PO) Take 1 tablet by mouth daily.   . Cyanocobalamin (VITAMIN B 12 PO) Take 1 tablet by mouth daily.   Marland Kitchen estradiol (VIVELLE-DOT) 0.05 MG/24HR patch estradiol 0.05 mg/24 hr semiweekly transdermal patch  APPLY 1 PATCH TWICE A WEEK  . KRILL OIL PO Take 1 tablet by mouth daily.   Marland Kitchen morphine (KADIAN) 50 MG 24 hr capsule Take 50 mg by mouth 2 (two) times daily.   . Multiple Vitamins-Minerals (CENTRUM SILVER PO) Take 1 tablet by mouth daily.  . rosuvastatin (CRESTOR) 5 MG tablet Take 1 tablet (5 mg total) by mouth daily.  . traMADol (ULTRAM) 50 MG tablet Take 50 mg by mouth 2 (two) times daily.  Marland Kitchen UNABLE TO FIND Med Name: NitroBID cream - from rheumotology -  topical for hands  . zolpidem (AMBIEN CR) 12.5 MG CR tablet Take 1 tablet (12.5 mg total) by mouth at bedtime as needed for sleep.     Allergies:   Compazine [prochlorperazine edisylate], Penicillins, Prochlorperazine, Sulfamethoxazole-trimethoprim, and Sulfa antibiotics   Social History   Socioeconomic History  . Marital status: Married    Spouse name: Not on file  . Number of children: 1  . Years of education: Not on file  . Highest education level: Not on file  Occupational History  . Not on file  Tobacco Use  . Smoking status: Former Research scientist (life sciences)  . Smokeless tobacco: Never Used  . Tobacco comment: socially  Vaping Use  . Vaping Use: Never used  Substance and Sexual Activity  .  Alcohol use: No  . Drug use: Yes    Types: Benzodiazepines, Morphine    Comment: prescribed tramadol, xanax, ambien  and morphine  . Sexual activity: Yes    Partners: Male  Other Topics Concern  . Not on file  Social History Narrative   Marital status/children/pets: Married. One child.   Education/employment: Works as a Hospital doctor:      -Wears a bicycle helmet riding a bike: Yes     -smoke alarm in the home:Yes     - wears seatbelt: Yes     - Feels safe in their relationships: Yes   Social Determinants of Radio broadcast assistant Strain: Not on file  Food Insecurity: Not on file  Transportation Needs: Not on file  Physical Activity: Not on file  Stress: Not on file  Social Connections: Not on file     Family History: The patient's family history includes Arthritis in her mother; Brain cancer in her sister; Colon cancer in her father; Early death (age of onset: 67) in her mother; Early death (age of onset: 68) in her brother; Early death (age of onset: 47) in her sister; Heart attack in her brother and mother; Heart disease in her brother, father, mother, and another family member; Hyperlipidemia in her father; Hypertension in her father and paternal grandfather; Kidney disease in her paternal grandmother; Liver cancer in her brother. There is no history of Esophageal cancer, Pancreatic cancer, or Stomach cancer.  ROS:   Please see the history of present illness.    All other systems reviewed and are negative.  EKGs/Labs/Other Studies Reviewed:    The following studies were reviewed today:  EKG:  EKG is not ordered today.    Recent Labs: 10/18/2020: ALT 16; BUN 15; Creatinine, Ser 0.73; Hemoglobin 12.2; Platelets 241; Potassium 5.0; Sodium 140; TSH 2.290  Recent Lipid Panel    Component Value Date/Time   CHOL 128 10/18/2020 1530   TRIG 61 10/18/2020 1530   TRIG 72 05/15/2013 1748   HDL 61 10/18/2020 1530   HDL 43 05/15/2013 1748   CHOLHDL 2.1 10/18/2020  1530   LDLCALC 54 10/18/2020 1530   LDLCALC 88 05/15/2013 1748    Physical Exam:    VS:  BP 118/70   Pulse 95   Ht _0  (1.626 m)   Wt 105 lb (47.6 kg)   SpO2 97%   BMI 18.02 kg/m     Wt Readings from Last 3 Encounters:  10/18/20 105 lb (47.6 kg)  07/16/20 104 lb (47.2 kg)  06/10/20 104 lb 6.4 oz (47.4 kg)     GEN:  Well nourished, well developed in no acute distress HEENT: Normal NECK: No JVD; No carotid bruits LYMPHATICS:  No lymphadenopathy CARDIAC: RRR, no murmurs, rubs, gallops RESPIRATORY:  Clear to auscultation without rales, wheezing or rhonchi  ABDOMEN: Soft, non-tender, non-distended MUSCULOSKELETAL:  No edema; No deformity  SKIN: Warm and dry NEUROLOGIC:  Alert and oriented x 3 PSYCHIATRIC:  Normal affect   ASSESSMENT:    1. Abnormal cardiac CT angiography   2. Family history of early CAD   3. Mixed hyperlipidemia   4. Statin intolerance   5. Ectopic atrial rhythm    PLAN:    In order of problems listed above:  1. CAD -  - calcium score done in 2015 that was 20/ 78th percentile for age and sex - repeat calcium scoring in August 2021 was 71 / 97 percentile for age/sex.  - She was started on Crestor 5 mg daily that she is tolerating well.   -She is asymptomatic, we will treat aggressively with lipid lowering therapies, will repeat CMP and lipids today.  2.  Palpitations  -  underwent 7-day Zio patch monitoring in September 2021 -  showed 2 very short runs of SVT with the longest lasting 12 beats.  She has been asymptomatic since then.  3. Family history of early CAD -as above.  4. Hyperlipidemia  -as above, given healthy diet and regular exercise there is hereditary component.  Medication Adjustments/Labs and Tests Ordered: Current medicines are reviewed at length with the patient today.  Concerns regarding medicines are outlined above.  Orders Placed This Encounter  Procedures  . Comp Met (CMET)  . CBC  . TSH  . Lipid Profile  . AMB  Referral to [facility]   No orders of the defined types were placed in this encounter.   Patient Instructions  Medication Instructions:   Your physician recommends that you continue on your current medications as directed. Please refer to the Current Medication list given to you today.  *If you need a refill on your cardiac medications before your next appointment, please call your pharmacy*   Lab Work:  TODAY--CMET, CBC, TSH, AND LIPIDS  If you have labs (blood work) drawn today and your tests are completely normal, you will receive your results only by: Marland Kitchen MyChart Message (if you have MyChart) OR . A paper copy in the mail If you have any lab test that is abnormal or we need to change your treatment, we will call you to review the results.   You have been referred to [place] TO SEE OUR PHARMACIST   Follow-Up: At [facility], you and your health needs are our priority.  As part of our continuing mission to provide you with exceptional heart care, we have created designated Provider Care Teams.  These Care Teams include your primary Cardiologist (physician) and Advanced Practice Providers (APPs -  Physician Assistants and Nurse Practitioners) who all work together to provide you with the care you need, when you need it.  We recommend signing up for the patient portal called "MyChart".  Sign up information is provided on this After Visit Summary.  MyChart is used to connect with patients for Virtual Visits (Telemedicine).  Patients are able to view lab/test results, encounter notes, upcoming appointments, etc.  Non-urgent messages can be sent to your provider as well.   To learn more about what you can do with MyChart, go to [email or web address].    Your next appointment:   6 month(s)  The format for your next appointment:   In Person  Provider:   Ena Dawley, MD  Signed, Ena Dawley, MD  10/19/2020 12:45 PM    [place]

## 2020-10-19 LAB — COMPREHENSIVE METABOLIC PANEL
ALT: 16 IU/L (ref 0–32)
AST: 20 IU/L (ref 0–40)
Albumin/Globulin Ratio: 1.5 (ref 1.2–2.2)
Albumin: 4.2 g/dL (ref 3.8–4.8)
Alkaline Phosphatase: 50 IU/L (ref 44–121)
BUN/Creatinine Ratio: 21 (ref 12–28)
BUN: 15 mg/dL (ref 8–27)
Bilirubin Total: 0.2 mg/dL (ref 0.0–1.2)
CO2: 25 mmol/L (ref 20–29)
Calcium: 9.5 mg/dL (ref 8.7–10.3)
Chloride: 103 mmol/L (ref 96–106)
Creatinine, Ser: 0.73 mg/dL (ref 0.57–1.00)
GFR calc Af Amer: 101 mL/min/{1.73_m2} (ref >59)
GFR calc non Af Amer: 87 mL/min/{1.73_m2} (ref >59)
Globulin, Total: 2.8 g/dL (ref 1.5–4.5)
Glucose: 76 mg/dL (ref 65–99)
Potassium: 5 mmol/L (ref 3.5–5.2)
Sodium: 140 mmol/L (ref 134–144)
Total Protein: 7 g/dL (ref 6.0–8.5)

## 2020-10-19 LAB — CBC
Hematocrit: 37.6 % (ref 34.0–46.6)
Hemoglobin: 12.2 g/dL (ref 11.1–15.9)
MCH: 30.8 pg (ref 26.6–33.0)
MCHC: 32.4 g/dL (ref 31.5–35.7)
MCV: 95 fL (ref 79–97)
Platelets: 241 10*3/uL (ref 150–450)
RBC: 3.96 x10E6/uL (ref 3.77–5.28)
RDW: 12.7 % (ref 11.7–15.4)
WBC: 5.7 10*3/uL (ref 3.4–10.8)

## 2020-10-19 LAB — LIPID PANEL
Chol/HDL Ratio: 2.1 ratio (ref 0.0–4.4)
Cholesterol, Total: 128 mg/dL (ref 100–199)
HDL: 61 mg/dL (ref >39)
LDL Chol Calc (NIH): 54 mg/dL (ref 0–99)
Triglycerides: 61 mg/dL (ref 0–149)
VLDL Cholesterol Cal: 13 mg/dL (ref 5–40)

## 2020-10-19 LAB — TSH: TSH: 2.29 u[IU]/mL (ref 0.450–4.500)

## 2020-10-28 DIAGNOSIS — Z79891 Long term (current) use of opiate analgesic: Secondary | ICD-10-CM | POA: Diagnosis not present

## 2020-10-28 DIAGNOSIS — M47812 Spondylosis without myelopathy or radiculopathy, cervical region: Secondary | ICD-10-CM | POA: Diagnosis not present

## 2020-10-28 DIAGNOSIS — M797 Fibromyalgia: Secondary | ICD-10-CM | POA: Diagnosis not present

## 2020-10-28 DIAGNOSIS — G894 Chronic pain syndrome: Secondary | ICD-10-CM | POA: Diagnosis not present

## 2020-11-01 ENCOUNTER — Other Ambulatory Visit: Payer: Self-pay

## 2020-11-01 ENCOUNTER — Telehealth (INDEPENDENT_AMBULATORY_CARE_PROVIDER_SITE_OTHER): Payer: BC Managed Care – PPO | Admitting: Pharmacist

## 2020-11-01 ENCOUNTER — Ambulatory Visit: Payer: BC Managed Care – PPO

## 2020-11-01 DIAGNOSIS — E782 Mixed hyperlipidemia: Secondary | ICD-10-CM

## 2020-11-01 MED ORDER — EVOLOCUMAB 140 MG/ML ~~LOC~~ SOAJ: 1.0000 mL | SUBCUTANEOUS | 3 refills | Status: DC

## 2020-11-01 NOTE — Progress Notes (Signed)
Patient ID: Wanda Robertson                 DOB: 09-Jul-1956                    MRN: [id number]     HPI: Wanda Robertson is a 65 y.o. female patient referred to lipid clinic by Dr. Meda Coffee. PMH is significant for IBS, GERD, Osteoporosis, fibromyalgia, family history of heart disease. She has a very significant family history of early CAD with mother having MI in 56's and died of another MI in her 18's. Brother had a stent placed at 74 and is still living at 98. She had a calcium score done in 2015 that was 20.25 Agatson units which matched with the 78th percentile for age/sex. Repeat calcium score was done August 2021 which was 77 representing 97th percentile for age/sex. She was started on rosuvastatin 5 mg daily. She underwent 7-day Zio patch monitoring for palpitations that showed 2 short runs of SVT with longest lasting 12 beats.   Due to clinic being virtual, called patient over the phone to discuss cholesterol.  Patient takes rosuvastatin 5mg  at bedtime but reports it is causing her muscle pain and sometimes sleep disturbances.  Is interested in discontinuing and starting PCSK9i  Current Medications: rosuvastatin 5 mg daily  Intolerances: rosuvastatin causing muscle pain  Risk Factors: family history, elevated calcium score  LDL goal: < 70 mg/dL  Diet: vegan diet for 20 years  Exercise: moderate amount of exercise with yoga  Family History: The patient's family history includes Arthritis in her mother; Brain cancer in her sister; Colon cancer in her father; Early death (age of onset: 31) in her mother; Early death (age of onset: 62) in her brother; Early death (age of onset: 74) in her sister; Heart attack in her brother and mother; Heart disease in her brother, father, mother, and another family member; Hyperlipidemia in her father; Hypertension in her father and paternal grandfather; Kidney disease in her paternal grandmother; Liver cancer in her brother.  Social History: former  smoker  Labs: 10/18/20: TC 128, TG 61, HDL 61, LDL 54 (on crestor 5 mg) 01/21/20: TC 154, TG 54, HDL 58, LDL 85   Past Medical History:  Diagnosis Date  . Abdominal hernia 04/01/2020  . Abnormal cervical Papanicolaou smear 04/01/2020  . Chicken pox   . Chronic back pain   . Chronic neck pain   . Family history of heart disease 02/25/2014  . Fibromyalgia 05/15/2013  . GERD (gastroesophageal reflux disease) 05/15/2013  . Heart murmur   . Hyperlipemia 05/15/2013  . Hypothyroidism 05/15/2013  . IBS (irritable bowel syndrome)   . Osteopenia   . Osteoporosis 05/15/2013   History of past treatment with Forteo   . Raynaud's disease 02/25/2014    Current Outpatient Medications on File Prior to Visit  Medication Sig Dispense Refill  . ALPRAZolam (XANAX) 0.5 MG tablet Take 1 tablet (0.5 mg total) by mouth at bedtime as needed. 90 tablet 1  . Biotin 1000 MCG tablet Take 1,000 mcg by mouth daily.     . Calcium Carbonate-Vitamin D 300-100 MG-UNIT CAPS Take 4 tablets by mouth daily.    . clobetasol (TEMOVATE) 0.05 % GEL Apply to affected area BID PRN 30 each 3  . Coenzyme Q10 (CO Q 10 PO) Take 1 tablet by mouth daily.     . Cyanocobalamin (VITAMIN B 12 PO) Take 1 tablet by mouth daily.     Marland Kitchen  estradiol (VIVELLE-DOT) 0.05 MG/24HR patch estradiol 0.05 mg/24 hr semiweekly transdermal patch  APPLY 1 PATCH TWICE A WEEK    . KRILL OIL PO Take 1 tablet by mouth daily.     Marland Kitchen morphine (KADIAN) 50 MG 24 hr capsule Take 50 mg by mouth 2 (two) times daily.     . Multiple Vitamins-Minerals (CENTRUM SILVER PO) Take 1 tablet by mouth daily.    . rosuvastatin (CRESTOR) 5 MG tablet Take 1 tablet (5 mg total) by mouth daily. 90 tablet 1  . traMADol (ULTRAM) 50 MG tablet Take 50 mg by mouth 2 (two) times daily.    Marland Kitchen UNABLE TO FIND Med Name: NitroBID cream - from rheumotology - topical for hands    . zolpidem (AMBIEN CR) 12.5 MG CR tablet Take 1 tablet (12.5 mg total) by mouth at bedtime as needed for sleep. 90 tablet 1    No current facility-administered medications on file prior to visit.    Allergies  Allergen Reactions  . Compazine [Prochlorperazine Edisylate] Other (See Comments)    Severe muscle requiring ER visit  . Penicillins Hives  . Prochlorperazine   . Sulfamethoxazole-Trimethoprim   . Sulfa Antibiotics Rash    Assessment/Plan:  1. Hyperlipidemia - Patient's last LDL was 54 which is at goal.  However is having difficulty tolerating rosuvastatin.  Recommended she d/c rosuvastatin for now due to muscle pain and will start PCSK9i due to patient's significant family history of early CAD.  Discussed over the phone storage, site selection and administration of Repatha and offered to teach patient how to administer in office.  Patient reports she is confident she will be able to administer it herself but will call with any questions.  Repeat lipid panel scheduled for end of March.  Karren Cobble, PharmD, BCACP, [place], [place] [address], [place], [place] [address] Phone: [phone]; Fax: [phone] 11/01/2020 3:03 PM

## 2020-11-01 NOTE — Patient Instructions (Addendum)
It was good speaking with you today  Your LDL (bad cholesterol) is already at goal level however if you are not able to tolerate the rosuvastatin we can discontinue it for now.    We will start you an injectable medication which you will inject once every 2 weeks  We will recheck your cholesterol at the end of March provided the medication is approved  Please call with any questions!  Thank you,  Karren Cobble, PharmD, BCACP, [place], [place]. [address] [place], [place] [address] Phone: [phone]; Fax: [phone] 11/01/2020 3:07 PM

## 2020-11-03 ENCOUNTER — Telehealth: Payer: Self-pay | Admitting: Pharmacist

## 2020-11-03 NOTE — Telephone Encounter (Signed)
PA for Repatha submitted.  Key: [id number]

## 2020-11-17 ENCOUNTER — Telehealth: Payer: Self-pay | Admitting: Pharmacist

## 2020-11-17 DIAGNOSIS — Z8249 Family history of ischemic heart disease and other diseases of the circulatory system: Secondary | ICD-10-CM

## 2020-11-17 DIAGNOSIS — E782 Mixed hyperlipidemia: Secondary | ICD-10-CM

## 2020-11-17 MED ORDER — PRALUENT 75 MG/ML ~~LOC~~ SOAJ: 1.0000 mL | SUBCUTANEOUS | 3 refills | Status: DC

## 2020-11-17 NOTE — Telephone Encounter (Signed)
Patient called and left message.  Insurance plan denied Repatha but hinted that perhaps Praluent would be covered.  Will send Rx and complete PA

## 2020-11-24 DIAGNOSIS — N838 Other noninflammatory disorders of ovary, fallopian tube and broad ligament: Secondary | ICD-10-CM | POA: Diagnosis not present

## 2020-11-24 DIAGNOSIS — N813 Complete uterovaginal prolapse: Secondary | ICD-10-CM | POA: Diagnosis not present

## 2020-11-24 DIAGNOSIS — M549 Dorsalgia, unspecified: Secondary | ICD-10-CM | POA: Diagnosis not present

## 2020-11-24 DIAGNOSIS — M797 Fibromyalgia: Secondary | ICD-10-CM | POA: Diagnosis not present

## 2020-11-30 NOTE — Telephone Encounter (Signed)
Patient called and left message stating that her husband retired on Feb 5th. They are in the process of getting a new insurance plan. Most likely with [employer].  I called pt back and let her know that we will need to submit a PA for PCKS9i to her new plan. She will call us in March when she has the information for their new plan.

## 2021-01-10 ENCOUNTER — Other Ambulatory Visit: Payer: BC Managed Care – PPO

## 2021-01-29 ENCOUNTER — Other Ambulatory Visit: Payer: Self-pay | Admitting: Cardiology

## 2021-02-03 NOTE — Telephone Encounter (Signed)
Spoke with patient.  Rosuvastatin 5mg  continues to make her nauseas with muscle pain.  Interested in starting [place].  Completed PA

## 2021-02-15 ENCOUNTER — Telehealth: Payer: Self-pay | Admitting: Pharmacist

## 2021-02-15 NOTE — Telephone Encounter (Signed)
Praluent has been approved through 02/13/22  [employer] card activated  BIN# [id number] PCN# [id number] GRP# [id number] ID# [id number]  Info called to pharmacy, cost now $25. They have to order. Pt made aware. Will get labs around apt with Dr. Johney Frame.

## 2021-03-08 IMAGING — CT CT CARDIAC CORONARY ARTERY CALCIUM SCORE
3 series · 14 of 20 positions shown, 15 images · non-contrast
Comparison: Coronary artery calcium score 08/21/2014.
COMPARISON: Coronary artery calcium score 08/21/2014.

Addendum:
EXAM:
OVER-READ INTERPRETATION  CT CHEST

The following report is an over-read performed by radiologist Dr.
Ge Shakespeare [REDACTED] on 06/10/2020. This
over-read does not include interpretation of cardiac or coronary
anatomy or pathology. The coronary calcium score interpretation by
the cardiologist is attached.
CLINICAL DATA: Risk stratification
Coronary Calcium Score
TECHNIQUE: The patient was scanned on a Siemens Force scanner. Axial
non-contrast 3 mm slices were carried out through the heart. The
data set was analyzed on a dedicated work station and scored using
the Agatson method.

[Series 2: casc 3.0 bv41 2 bestsyst 40 % · axial · 0.29mm/px · z∈[-249,-159]mm · 4 of 50 slices shown, 5 images]
[im 10/50  vessel]
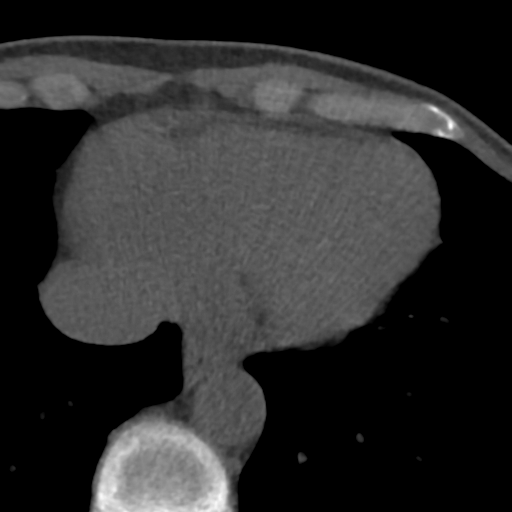
[im 10/50  lung]
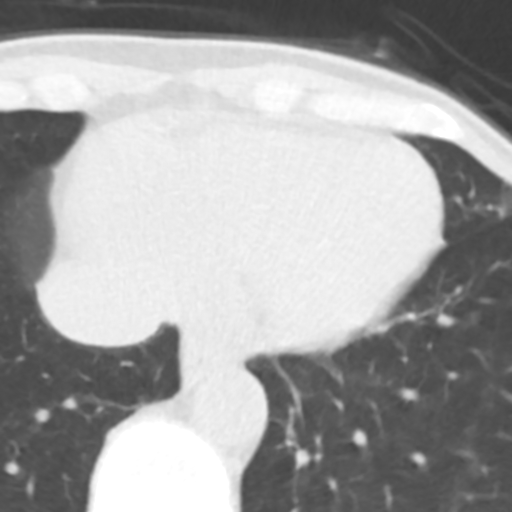
[im 20/50  vessel]
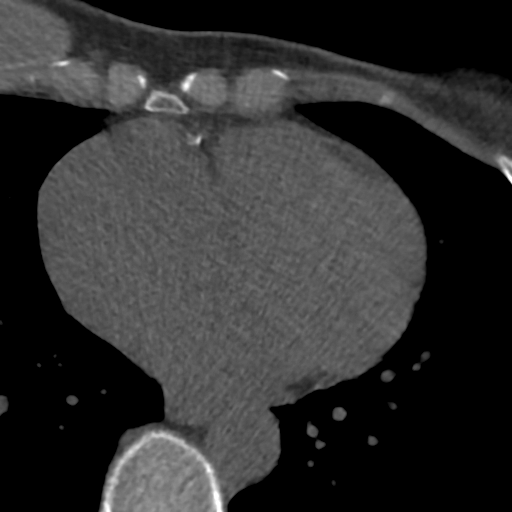
[im 30/50  vessel]
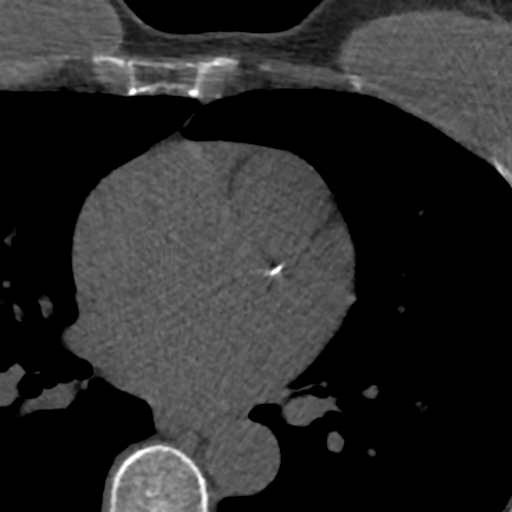
[im 40/50  vessel]
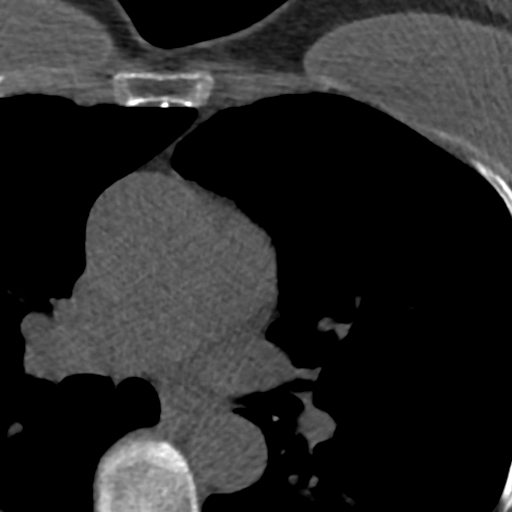

[Series 3: lung 39 % · axial · 0.62mm/px · z∈[-252,-156]mm · 5 of 50 slices shown]
[im 9/50  lung]
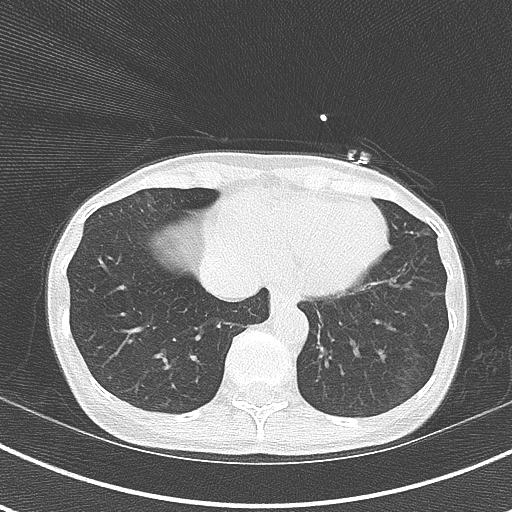
[im 17/50  lung]
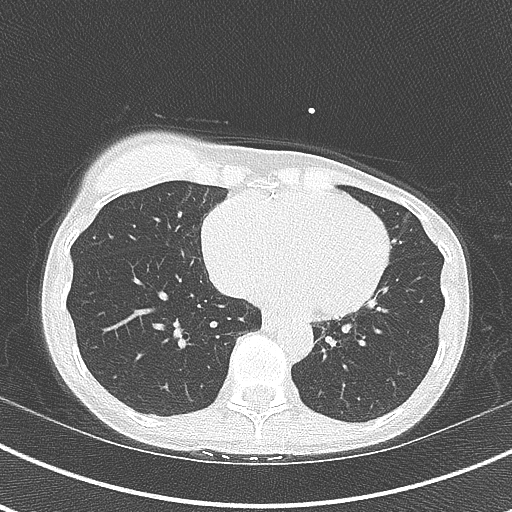
[im 25/50  lung]
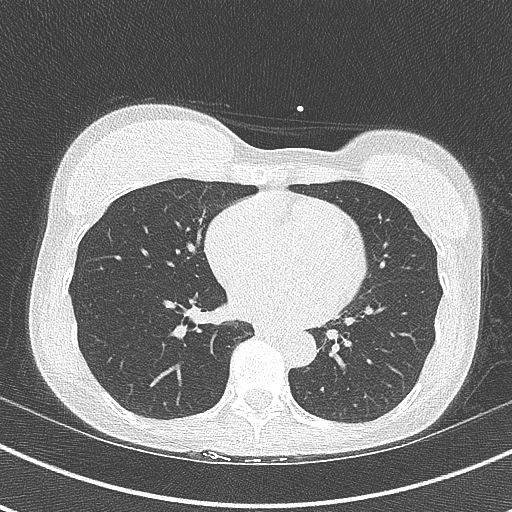
[im 33/50  lung]
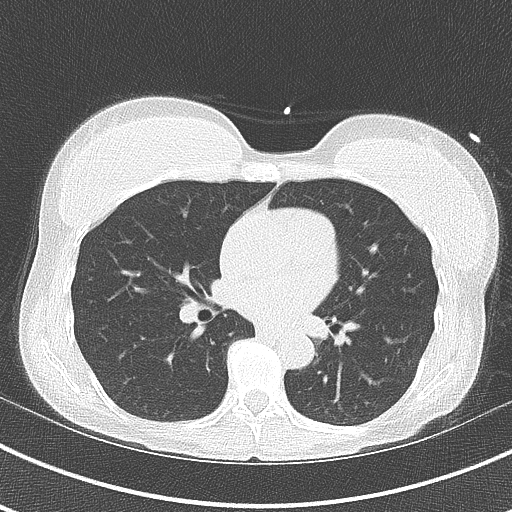
[im 41/50  lung]
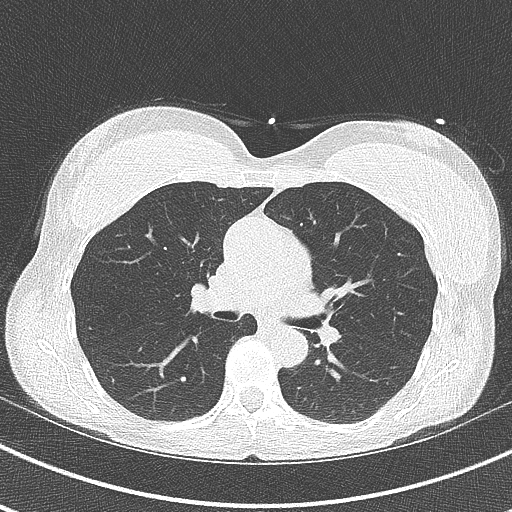

[Series 4: lung st 39 % · axial · 0.62mm/px · z∈[-252,-156]mm · 5 of 50 slices shown]
[im 9/50  lung]
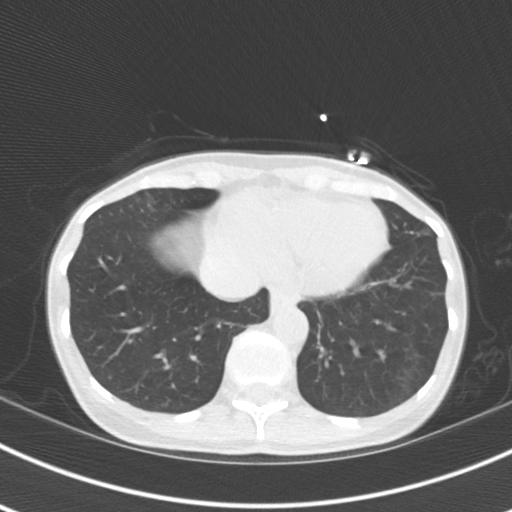
[im 17/50  lung]
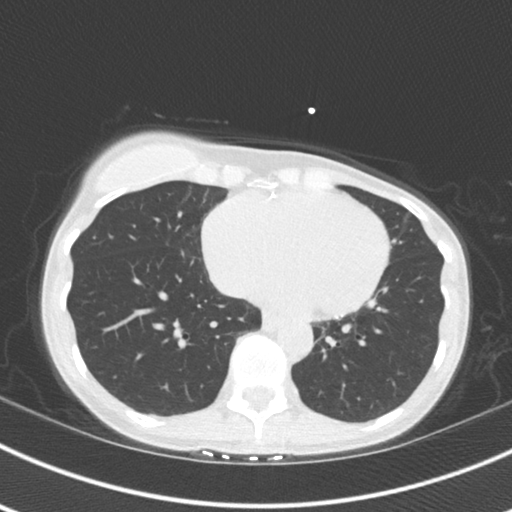
[im 25/50  lung]
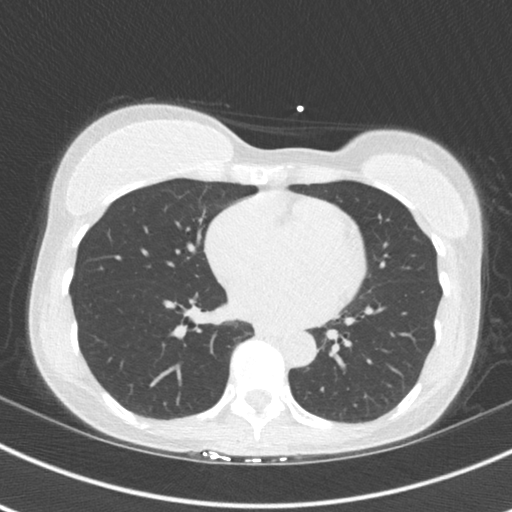
[im 33/50  lung]
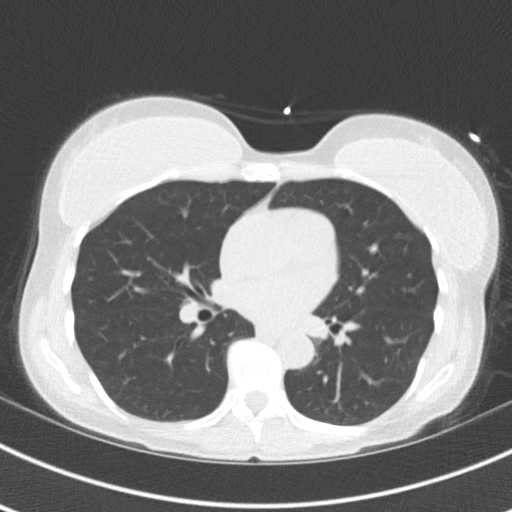
[im 41/50  lung]
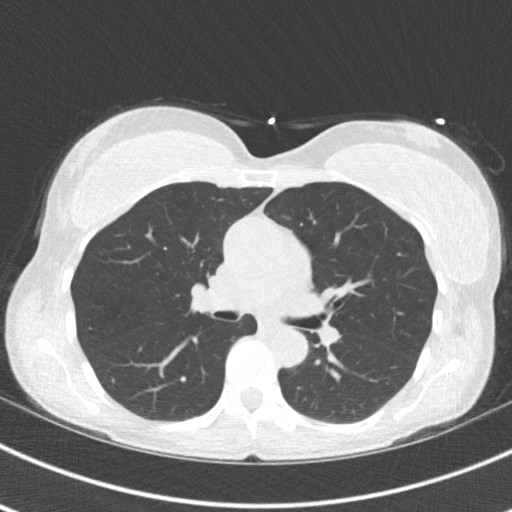

[14 of 20 positions shown; findings below may reference images not displayed]

FINDINGS: Within the visualized portions of the thorax there are no suspicious
appearing pulmonary nodules or masses, there is no acute
consolidative airspace disease, no pleural effusions, no
pneumothorax and no lymphadenopathy. Visualized portions of the
upper abdomen are unremarkable. There are no aggressive appearing
lytic or blastic lesions noted in the visualized portions of the
skeleton. Bilateral breast implants are incidentally noted.
IMPRESSION: No significant incidental noncardiac findings are noted.
FINDINGS: Non-cardiac: See separate report from [REDACTED].

Ascending Aorta: Normal size, no calcifications.

Pericardium: Normal.

Coronary arteries: Normal origin.
IMPRESSION: c

*** End of Addendum ***
EXAM:
OVER-READ INTERPRETATION  CT CHEST

The following report is an over-read performed by radiologist Dr.
Ge Shakespeare [REDACTED] on 06/10/2020. This
over-read does not include interpretation of cardiac or coronary
anatomy or pathology. The coronary calcium score interpretation by
the cardiologist is attached.
FINDINGS: Within the visualized portions of the thorax there are no suspicious
appearing pulmonary nodules or masses, there is no acute
consolidative airspace disease, no pleural effusions, no
pneumothorax and no lymphadenopathy. Visualized portions of the
upper abdomen are unremarkable. There are no aggressive appearing
lytic or blastic lesions noted in the visualized portions of the
skeleton. Bilateral breast implants are incidentally noted.
IMPRESSION: No significant incidental noncardiac findings are noted.

## 2021-04-20 ENCOUNTER — Other Ambulatory Visit: Payer: Self-pay | Admitting: Pharmacist Clinician (PhC)/ Clinical Pharmacy Specialist

## 2021-04-20 ENCOUNTER — Other Ambulatory Visit: Payer: Self-pay

## 2021-04-20 DIAGNOSIS — E782 Mixed hyperlipidemia: Secondary | ICD-10-CM

## 2021-04-21 LAB — LIPID PANEL
Chol/HDL Ratio: 1.8 ratio (ref 0.0–4.4)
Cholesterol, Total: 118 mg/dL (ref 100–199)
HDL: 65 mg/dL (ref >39)
LDL Chol Calc (NIH): 42 mg/dL (ref 0–99)
Triglycerides: 46 mg/dL (ref 0–149)
VLDL Cholesterol Cal: 11 mg/dL (ref 5–40)

## 2021-04-21 LAB — HEPATIC FUNCTION PANEL
ALT: 14 IU/L (ref 0–32)
AST: 20 IU/L (ref 0–40)
Albumin: 4.5 g/dL (ref 3.8–4.8)
Alkaline Phosphatase: 47 IU/L (ref 44–121)
Bilirubin Total: 0.2 mg/dL (ref 0.0–1.2)
Bilirubin, Direct: <0.1 mg/dL (ref 0.00–0.40)
Total Protein: 7 g/dL (ref 6.0–8.5)

## 2021-04-25 NOTE — Progress Notes (Deleted)
Cardiology Office Note:    Date:  04/25/2021   ID:  Wanda Robertson, DOB 1956/04/18, MRN [id number]  PCP:  Ma Hillock, DO   [facility] Cardiologist:  Ena Dawley, MD (Inactive) {   Referring MD: Ma Hillock, DO     History of Present Illness:    Wanda Robertson is a 65 y.o. female with a hx of CAD with calcium score 503 (97% for age, gender matched controls), fibromyalgia, and HLD who was previously followed by Dr. Meda Coffee who now returns to clinic for follow-up.  Per review of the record, the patient's calcium core was 20 in 2015 and 503 in 2021. She has been on crestor which she has tolerated well.   Last saw Dr. Meda Coffee in 10/2020 where she was doing well. Remained active. Adheres to vegan diet.  Today,  Past Medical History:  Diagnosis Date   Abdominal hernia 04/01/2020   Abnormal cervical Papanicolaou smear 04/01/2020   Chicken pox    Chronic back pain    Chronic neck pain    Family history of heart disease 02/25/2014   Fibromyalgia 05/15/2013   GERD (gastroesophageal reflux disease) 05/15/2013   Heart murmur    Hyperlipemia 05/15/2013   Hypothyroidism 05/15/2013   IBS (irritable bowel syndrome)    Osteopenia    Osteoporosis 05/15/2013   History of past treatment with Forteo    Raynaud's disease 02/25/2014    Past Surgical History:  Procedure Laterality Date   COLONOSCOPY     TONSILLECTOMY      Current Medications: No outpatient medications have been marked as taking for the 04/28/21 encounter (Appointment) with Freada Bergeron, MD.     Allergies:   Compazine [prochlorperazine edisylate], Penicillins, Prochlorperazine, Sulfamethoxazole-trimethoprim, and Sulfa antibiotics   Social History   Socioeconomic History   Marital status: Married    Spouse name: Not on file   Number of children: 1   Years of education: Not on file   Highest education level: Not on file  Occupational History   Not on file  Tobacco Use   Smoking  status: Former    Pack years: 0.00   Smokeless tobacco: Never   Tobacco comments:    socially  Scientific laboratory technician Use: Never used  Substance and Sexual Activity   Alcohol use: No   Drug use: Yes    Types: Benzodiazepines, Morphine    Comment: prescribed tramadol, xanax, ambien  and morphine   Sexual activity: Yes    Partners: Male  Other Topics Concern   Not on file  Social History Narrative   Marital status/children/pets: Married. One child.   Education/employment: Works as a Hospital doctor:      -Wears a bicycle helmet riding a bike: Yes     -smoke alarm in the home:Yes     - wears seatbelt: Yes     - Feels safe in their relationships: Yes   Social Determinants of Radio broadcast assistant Strain: Not on file  Food Insecurity: Not on file  Transportation Needs: Not on file  Physical Activity: Not on file  Stress: Not on file  Social Connections: Not on file     Family History: The patient's ***family history includes Arthritis in her mother; Brain cancer in her sister; Colon cancer in her father; Early death (age of onset: 23) in her mother; Early death (age of onset: 69) in her brother; Early death (age of onset: 41) in her sister;  Heart attack in her brother and mother; Heart disease in her brother, father, mother, and another family member; Hyperlipidemia in her father; Hypertension in her father and paternal grandfather; Kidney disease in her paternal grandmother; Liver cancer in her brother. There is no history of Esophageal cancer, Pancreatic cancer, or Stomach cancer.  ROS:   Please see the history of present illness.    *** All other systems reviewed and are negative.  EKGs/Labs/Other Studies Reviewed:    The following studies were reviewed today: TTE 2020-07-25: IMPRESSIONS     1. Left ventricular ejection fraction, by estimation, is 55 to 60%. The  left ventricle has normal function. The left ventricle has no regional  wall motion abnormalities.  There is mild left ventricular hypertrophy.  Left ventricular diastolic parameters  were normal.   2. In the apical 4 chamber view, the RVEF appears mildly down, but the S'  and TAPSE are normal. IN other windows, function appears normal. The right  ventricular size is normal. There is mildly elevated pulmonary artery  systolic pressure.   3. Right atrial size was mildly dilated.   4. The mitral valve is normal in structure. Mild mitral valve  regurgitation.   5. TR jet is directed anterior into RA . Tricuspid valve regurgitation is  mild to moderate.   6. The aortic valve is abnormal. Aortic valve regurgitation is not  visualized. Mild aortic valve sclerosis is present, with no evidence of  aortic valve stenosis.   7. The inferior vena cava is normal in size with greater than 50%  respiratory variability, suggesting right atrial pressure of 3 mmHg.   Cardiac monitor 06/2020: Patient had a min HR of 61 bpm, max HR of 143 bpm, and avg HR of 80 BPM. 2 Supraventricular Tachycardia runs occurred, the longest run lasting 12 beats. Rare PACs and PVC.   Predominant underlying rhythm was Sinus Rhythm. 2 very short runs of Supraventricular Tachycardia occurred. The patient didn't record any symptoms.  Coronary Calcium Score 2021: 503 EXAM: Coronary Calcium Score   TECHNIQUE: The patient was scanned on a [employer]. Axial non-contrast 3 mm slices were carried out through the heart. The data set was analyzed on a dedicated work station and scored using the [place].   FINDINGS: Non-cardiac: See separate report from [facility].   Ascending Aorta: Normal size, no calcifications.   Pericardium: Normal.   Coronary arteries: Normal origin.  EKG:  EKG is *** ordered today.  The ekg ordered today demonstrates ***  Recent Labs: 10/18/2020: BUN 15; Creatinine, Ser 0.73; Hemoglobin 12.2; Platelets 241; Potassium 5.0; Sodium 140; TSH 2.290 04/20/2021: ALT 14  Recent  Lipid Panel    Component Value Date/Time   CHOL 118 04/20/2021 1643   TRIG 46 04/20/2021 1643   TRIG 72 05/15/2013 1748   HDL 65 04/20/2021 1643   HDL 43 05/15/2013 1748   CHOLHDL 1.8 04/20/2021 1643   LDLCALC 42 04/20/2021 1643   LDLCALC 88 05/15/2013 1748     Risk Assessment/Calculations:   {Does this patient have ATRIAL FIBRILLATION?[phone]}       Physical Exam:    VS:  There were no vitals taken for this visit.    Wt Readings from Last 3 Encounters:  10/18/20 105 lb (47.6 kg)  07/16/20 104 lb (47.2 kg)  06/10/20 104 lb 6.4 oz (47.4 kg)     GEN: *** Well nourished, well developed in no acute distress HEENT: Normal NECK: No JVD; No carotid bruits LYMPHATICS: No  lymphadenopathy CARDIAC: ***RRR, no murmurs, rubs, gallops RESPIRATORY:  Clear to auscultation without rales, wheezing or rhonchi  ABDOMEN: Soft, non-tender, non-distended MUSCULOSKELETAL:  No edema; No deformity  SKIN: Warm and dry NEUROLOGIC:  Alert and oriented x 3 PSYCHIATRIC:  Normal affect   ASSESSMENT:    No diagnosis found. PLAN:    In order of problems listed above:  #Coronary artery disease without angina: #Coronary Artery Calcification: Calcium score 503 (97% for age, gender matched controls). Doing well with no anginal symptoms. Remains active. -Continue praulent 75mg  q14days  #HLD: -Continue praulent 75mg  q14days  {Are you ordering a CV Procedure (e.g. stress test, cath, DCCV, TEE, etc)?   Press F2        :[id number]}    Medication Adjustments/Labs and Tests Ordered: Current medicines are reviewed at length with the patient today.  Concerns regarding medicines are outlined above.  No orders of the defined types were placed in this encounter.  No orders of the defined types were placed in this encounter.   There are no Patient Instructions on file for this visit.   Signed, Freada Bergeron, MD  04/25/2021 7:17 AM    [place]

## 2021-04-27 NOTE — Progress Notes (Signed)
Cardiology Office Note:    Date:  04/28/2021   ID:  Wanda Robertson, DOB 01/30/1956, MRN [id number]  PCP:  Ma Hillock, DO   [place] Providers Cardiologist:  Ena Dawley, MD (Inactive) {   Referring MD: Ma Hillock, DO     History of Present Illness:    Wanda Robertson is a 65 y.o. female with a hx of CAD with calcium score 503 (97% for age, gender matched controls), fibromyalgia, and HLD who was previously followed by Dr. Meda Coffee who now returns to clinic for follow-up.  Per review of the record, the patient's calcium core was 20 in 2015 and 503 in 2021. She has been on crestor which she has tolerated well.   Last saw Dr. Meda Coffee in 10/2020 where she was doing well. Remained active. Adheres to vegan diet.  Today, patient is feeling okay with no complications. She says she feels better with the praluent over the crestor and has been on the medication for 2 months. She denies chest pain and shortness of breath.Has been adherent to vegan diet. Remains very active. She says she is fairly active. She does yoga, walks her dog and attends to her yard.  Her family history, her mother has died from CVD at 65 yo and her father died from CVD at 38 yo. Of note, her mothers siblings, has a history of CVD.  Patient denies abdominal pain, diarrhea, palpitations, cramping, nausea, headaches, lightheadedness, LE Edema, syncope,orthopnea or PND.   Past Medical History:  Diagnosis Date   Abdominal hernia 04/01/2020   Abnormal cervical Papanicolaou smear 04/01/2020   Chicken pox    Chronic back pain    Chronic neck pain    Family history of heart disease 02/25/2014   Fibromyalgia 05/15/2013   GERD (gastroesophageal reflux disease) 05/15/2013   Heart murmur    Hyperlipemia 05/15/2013   Hypothyroidism 05/15/2013   IBS (irritable bowel syndrome)    Osteopenia    Osteoporosis 05/15/2013   History of past treatment with Forteo    Raynaud's disease 02/25/2014    Past Surgical  History:  Procedure Laterality Date   COLONOSCOPY     TONSILLECTOMY      Current Medications: Current Meds  Medication Sig   Alirocumab (PRALUENT) 75 MG/ML SOAJ Inject 1 mL into the skin every 14 (fourteen) days.   ALPRAZolam (XANAX) 0.5 MG tablet Take 1 tablet (0.5 mg total) by mouth at bedtime as needed.   Biotin 1000 MCG tablet Take 1,000 mcg by mouth daily.    Calcium Carbonate-Vitamin D 300-100 MG-UNIT CAPS Take 4 tablets by mouth daily.   clobetasol (TEMOVATE) 0.05 % GEL Apply to affected area BID PRN   Coenzyme Q10 (CO Q 10 PO) Take 1 tablet by mouth daily.    Cyanocobalamin (VITAMIN B 12 PO) Take 1 tablet by mouth daily.    estradiol (VIVELLE-DOT) 0.05 MG/24HR patch estradiol 0.05 mg/24 hr semiweekly transdermal patch  APPLY 1 PATCH TWICE A WEEK   KRILL OIL PO Take 1 tablet by mouth daily.    morphine (KADIAN) 30 MG 24 hr capsule Take 30 mg by mouth in the morning, at noon, and at bedtime.   morphine (KADIAN) 50 MG 24 hr capsule Take 50 mg by mouth 2 (two) times daily.    traMADol (ULTRAM) 50 MG tablet Take 50 mg by mouth 2 (two) times daily.   UNABLE TO FIND Med Name: NitroBID cream - from rheumotology - topical for hands   zolpidem (AMBIEN CR)  12.5 MG CR tablet Take 1 tablet (12.5 mg total) by mouth at bedtime as needed for sleep.     Allergies:   Compazine [prochlorperazine edisylate], Penicillins, Prochlorperazine, Sulfamethoxazole-trimethoprim, and Sulfa antibiotics   Social History   Socioeconomic History   Marital status: Married    Spouse name: Not on file   Number of children: 1   Years of education: Not on file   Highest education level: Not on file  Occupational History   Not on file  Tobacco Use   Smoking status: Former   Smokeless tobacco: Never   Tobacco comments:    socially  Vaping Use   Vaping Use: Never used  Substance and Sexual Activity   Alcohol use: No   Drug use: Yes    Types: Benzodiazepines, Morphine    Comment: prescribed tramadol,  xanax, ambien  and morphine   Sexual activity: Yes    Partners: Male  Other Topics Concern   Not on file  Social History Narrative   Marital status/children/pets: Married. One child.   Education/employment: Works as a Hospital doctor:      -Wears a bicycle helmet riding a bike: Yes     -smoke alarm in the home:Yes     - wears seatbelt: Yes     - Feels safe in their relationships: Yes   Social Determinants of Radio broadcast assistant Strain: Not on file  Food Insecurity: Not on file  Transportation Needs: Not on file  Physical Activity: Not on file  Stress: Not on file  Social Connections: Not on file     Family History: The patient's family history includes Arthritis in her mother; Brain cancer in her sister; Colon cancer in her father; Early death (age of onset: 22) in her mother; Early death (age of onset: 78) in her brother; Early death (age of onset: 56) in her sister; Heart attack in her brother and mother; Heart disease in her brother, father, mother, and another family member; Hyperlipidemia in her father; Hypertension in her father and paternal grandfather; Kidney disease in her paternal grandmother; Liver cancer in her brother. There is no history of Esophageal cancer, Pancreatic cancer, or Stomach cancer.  ROS:   Review of Systems  Constitutional:  Negative for chills and malaise/fatigue.  HENT:  Negative for ear pain.   Eyes:  Negative for blurred vision and redness.  Respiratory:  Negative for cough and shortness of breath.   Cardiovascular:  Negative for chest pain, palpitations, orthopnea, claudication, leg swelling and PND.  Gastrointestinal:  Negative for abdominal pain, blood in stool, constipation, heartburn, nausea and vomiting.  Genitourinary:  Negative for dysuria.  Musculoskeletal:  Negative for back pain and joint pain.  Neurological:  Negative for dizziness, tingling, tremors and headaches.  Endo/Heme/Allergies:  Negative for polydipsia. Does not  bruise/bleed easily.  Psychiatric/Behavioral:  Negative for depression. The patient does not have insomnia.     EKGs/Labs/Other Studies Reviewed:    The following studies were reviewed today:  ECHO 07/12/2020:  IMPRESSIONS  1. Left ventricular ejection fraction, by estimation, is 55 to 60%. The  left ventricle has normal function. The left ventricle has no regional  wall motion abnormalities. There is mild left ventricular hypertrophy.  Left ventricular diastolic parameters  were normal.   2. In the apical 4 chamber view, the RVEF appears mildly down, but the S'  and TAPSE are normal. IN other windows, function appears normal. The right  ventricular size is normal. There is mildly  elevated pulmonary artery  systolic pressure.   3. Right atrial size was mildly dilated.   4. The mitral valve is normal in structure. Mild mitral valve  regurgitation.   5. TR jet is directed anterior into RA . Tricuspid valve regurgitation is  mild to moderate.   6. The aortic valve is abnormal. Aortic valve regurgitation is not  visualized. Mild aortic valve sclerosis is present, with no evidence of  aortic valve stenosis.   7. The inferior vena cava is normal in size with greater than 50%  respiratory variability, suggesting right atrial pressure of 3 mmHg.   TTE 07/12/20: IMPRESSIONS   1. Left ventricular ejection fraction, by estimation, is 55 to 60%. The  left ventricle has normal function. The left ventricle has no regional  wall motion abnormalities. There is mild left ventricular hypertrophy.  Left ventricular diastolic parameters  were normal.   2. In the apical 4 chamber view, the RVEF appears mildly down, but the S'  and TAPSE are normal. IN other windows, function appears normal. The right  ventricular size is normal. There is mildly elevated pulmonary artery  systolic pressure.   3. Right atrial size was mildly dilated.   4. The mitral valve is normal in structure. Mild mitral valve   regurgitation.   5. TR jet is directed anterior into RA . Tricuspid valve regurgitation is  mild to moderate.   6. The aortic valve is abnormal. Aortic valve regurgitation is not  visualized. Mild aortic valve sclerosis is present, with no evidence of  aortic valve stenosis.   7. The inferior vena cava is normal in size with greater than 50%  respiratory variability, suggesting right atrial pressure of 3 mmHg.   Cardiac monitor 06/2020: Patient had a min HR of 61 bpm, max HR of 143 bpm, and avg HR of 80 BPM. 2 Supraventricular Tachycardia runs occurred, the longest run lasting 12 beats. Rare PACs and PVC.   Predominant underlying rhythm was Sinus Rhythm. 2 very short runs of Supraventricular Tachycardia occurred. The patient didn't record any symptoms.  Coronary Calcium Score 2021: 503 EXAM: Coronary Calcium Score   TECHNIQUE: The patient was scanned on a [employer]. Axial non-contrast 3 mm slices were carried out through the heart. The data set was analyzed on a dedicated work station and scored using the [place].   FINDINGS: Non-cardiac: See separate report from [facility].   Ascending Aorta: Normal size, no calcifications.   Pericardium: Normal.   Coronary arteries: Normal origin.  EKG:   04/28/2021: NSR, HR 92 bpm  Recent Labs: 10/18/2020: BUN 15; Creatinine, Ser 0.73; Hemoglobin 12.2; Platelets 241; Potassium 5.0; Sodium 140; TSH 2.290 04/20/2021: ALT 14  Recent Lipid Panel    Component Value Date/Time   CHOL 118 04/20/2021 1643   TRIG 46 04/20/2021 1643   TRIG 72 05/15/2013 1748   HDL 65 04/20/2021 1643   HDL 43 05/15/2013 1748   CHOLHDL 1.8 04/20/2021 1643   LDLCALC 42 04/20/2021 1643   LDLCALC 88 05/15/2013 1748     Risk Assessment/Calculations:           Physical Exam:    VS:  BP 112/70   Pulse 92   Ht 5\' 5"  (1.651 m)   Wt 110 lb (49.9 kg)   SpO2 99%   BMI 18.30 kg/m     Wt Readings from Last 3 Encounters:   04/28/21 110 lb (49.9 kg)  10/18/20 105 lb (47.6 kg)  07/16/20 104 lb (  47.2 kg)     GEN:  Well nourished, well developed in no acute distress HEENT: Normal NECK: No JVD; No carotid bruits CARDIAC: RRR, no murmurs, rubs, gallops RESPIRATORY:  Clear to auscultation without rales, wheezing or rhonchi  ABDOMEN: Soft, non-tender, non-distended MUSCULOSKELETAL:  No edema; No deformity  SKIN: Warm and dry NEUROLOGIC:  Alert and oriented x 3 PSYCHIATRIC:  Normal affect   ASSESSMENT:    1. Coronary artery disease involving native coronary artery of native heart without angina pectoris   2. Mixed hyperlipidemia   3. Family history of early CAD   104. Statin intolerance    PLAN:    In order of problems listed above:  #Coronary artery disease without angina: #Coronary Artery Calcification: Calcium score 503 (97% for age, gender matched controls). Doing well with no anginal symptoms. Remains active. -Continue praulent 75mg  q14days  #HLD: -Continue praulent 75mg  q14days    FOLLOW UP IN 1 YEAR   Medication Adjustments/Labs and Tests Ordered: Current medicines are reviewed at length with the patient today.  Concerns regarding medicines are outlined above.  Orders Placed This Encounter  Procedures   EKG 12-Lead    No orders of the defined types were placed in this encounter.   Patient Instructions  Medication Instructions:   Your physician recommends that you continue on your current medications as directed. Please refer to the Current Medication list given to you today.  *If you need a refill on your cardiac medications before your next appointment, please call your pharmacy*   Follow-Up: At [facility], you and your health needs are our priority.  As part of our continuing mission to provide you with exceptional heart care, we have created designated Provider Care Teams.  These Care Teams include your primary Cardiologist (physician) and Advanced Practice Providers (APPs  -  Physician Assistants and Nurse Practitioners) who all work together to provide you with the care you need, when you need it.  We recommend signing up for the patient portal called "MyChart".  Sign up information is provided on this After Visit Summary.  MyChart is used to connect with patients for Virtual Visits (Telemedicine).  Patients are able to view lab/test results, encounter notes, upcoming appointments, etc.  Non-urgent messages can be sent to your provider as well.   To learn more about what you can do with MyChart, go to [email or web address].    Your next appointment:   1 year(s)  The format for your next appointment:   In Person  Provider:   Gwyndolyn Kaufman, MD       Ardell Isaacs as a scribe for Freada Bergeron, MD.,have documented all relevant documentation on the behalf of Freada Bergeron, MD,as directed by  Freada Bergeron, MD while in the presence of Freada Bergeron, MD.  I, Freada Bergeron, MD, have reviewed all documentation for this visit. The documentation on 04/28/21 for the exam, diagnosis, procedures, and orders are all accurate and complete.   Signed, Freada Bergeron, MD  04/28/2021 2:26 PM    [place]

## 2021-04-28 ENCOUNTER — Ambulatory Visit: Payer: 59 | Admitting: Cardiology

## 2021-04-28 ENCOUNTER — Other Ambulatory Visit: Payer: Self-pay | Admitting: Pharmacist

## 2021-04-28 ENCOUNTER — Other Ambulatory Visit: Payer: Self-pay

## 2021-04-28 ENCOUNTER — Encounter: Payer: Self-pay | Admitting: Cardiology

## 2021-04-28 VITALS — BP 112/70 | HR 92 | Ht 65.0 in | Wt 110.0 lb

## 2021-04-28 DIAGNOSIS — I2510 Atherosclerotic heart disease of native coronary artery without angina pectoris: Secondary | ICD-10-CM

## 2021-04-28 DIAGNOSIS — Z8249 Family history of ischemic heart disease and other diseases of the circulatory system: Secondary | ICD-10-CM

## 2021-04-28 DIAGNOSIS — Z789 Other specified health status: Secondary | ICD-10-CM | POA: Diagnosis not present

## 2021-04-28 DIAGNOSIS — E782 Mixed hyperlipidemia: Secondary | ICD-10-CM | POA: Diagnosis not present

## 2021-04-28 MED ORDER — PRALUENT 75 MG/ML ~~LOC~~ SOAJ: 1.0000 mL | SUBCUTANEOUS | 3 refills | Status: DC

## 2021-04-28 NOTE — Patient Instructions (Signed)

## 2021-06-15 ENCOUNTER — Other Ambulatory Visit: Payer: Self-pay

## 2021-06-15 ENCOUNTER — Encounter: Payer: Self-pay | Admitting: Family Medicine

## 2021-06-15 ENCOUNTER — Ambulatory Visit (INDEPENDENT_AMBULATORY_CARE_PROVIDER_SITE_OTHER): Payer: 59 | Admitting: Family Medicine

## 2021-06-15 VITALS — BP 111/76 | HR 95 | Temp 98.4°F | Ht 65.0 in | Wt 109.0 lb

## 2021-06-15 DIAGNOSIS — G8929 Other chronic pain: Secondary | ICD-10-CM | POA: Diagnosis not present

## 2021-06-15 DIAGNOSIS — F5101 Primary insomnia: Secondary | ICD-10-CM | POA: Diagnosis not present

## 2021-06-15 DIAGNOSIS — M549 Dorsalgia, unspecified: Secondary | ICD-10-CM

## 2021-06-15 DIAGNOSIS — M542 Cervicalgia: Secondary | ICD-10-CM | POA: Diagnosis not present

## 2021-06-15 MED ORDER — ALPRAZOLAM 0.5 MG PO TABS
0.5000 mg | ORAL_TABLET | Freq: Every evening | ORAL | 1 refills | Status: DC | PRN
Start: 2021-06-15 — End: 2021-12-28

## 2021-06-15 MED ORDER — ZOLPIDEM TARTRATE ER 12.5 MG PO TBCR: 12.5000 mg | EXTENDED_RELEASE_TABLET | Freq: Every evening | ORAL | 1 refills | Status: DC | PRN

## 2021-06-15 NOTE — Patient Instructions (Addendum)
Great to see you today.  I have refilled the medication(s) we provide.   If labs were collected, we will inform you of lab results once received either by echart message or telephone call.   - echart message- for normal results that have been seen by the patient already.   - telephone call: abnormal results or if patient has not viewed results in their echart.  Follow up on chronic conditions in 5.5 mos.  Make sure to schedule yearly preventive exams.

## 2021-06-15 NOTE — Progress Notes (Signed)
Patient ID: Wanda Robertson, female  DOB: 1955-12-31, 65 y.o.   MRN: [id number] Patient Care Team    Relationship Specialty Notifications Start End  Ma Hillock, DO PCP - General Family Medicine  07/16/20   Dorothy Spark, MD (Inactive) PCP - Cardiology Cardiology Admissions 10/15/18   Mauri Pole, MD Consulting Physician Gastroenterology  07/21/20   Christain Sacramento, [place] Referring Physician Optometry  07/21/20   Linda Hedges, [place] Physician Obstetrics and Gynecology  07/21/20   Nicholaus Bloom, MD  Anesthesiology  07/21/20    Comment: pain management    Chief Complaint  Patient presents with   Insomnia     Subjective: Wanda Robertson is a 65 y.o.  female present for [facility] Primary insomnia Patient reports the Ambien 12.5 mg nightly is working well for her insomnia.  Occasionally she will use the Xanax 0.5 mg nightly if she is unable to fall asleep.  She denies any complications.  She denies oversedation. Prior note: Patient reports she has been prescribed Ambien 12.5 mg nightly as needed for insomnia for many years. She also is prescribed Xanax 0.5 mg nightly as needed for insomnia. She states she does not take both of these at the same time typically. Depending upon her sleep pattern. She is prescribed tramadol and morphine through her pain specialist. She feels she is doing much better. She states they are aware of her Ambien and benzodiazepine use. She denies any sedation with these medications.  Chronic neck pain/Chronic back pain, unspecified back location, unspecified back pain laterality/Cervicalgia Patient is prescribed morphine and tramadol through her pain management specialist.  Depression screen [facility] 2/9 06/15/2021 01/21/2020 07/22/2018 12/20/2017 12/22/2016  Decreased Interest 0 0 0 0 0  Down, Depressed, Hopeless 0 0 0 0 0  PHQ - 2 Score 0 0 0 0 0  Altered sleeping 1 0 - - -  Tired, decreased energy 1 0 - - -  Change in appetite 0 0 - - -  Feeling bad or  failure about yourself  0 0 - - -  Trouble concentrating 0 0 - - -  Moving slowly or fidgety/restless 0 0 - - -  Suicidal thoughts 0 0 - - -  PHQ-9 Score 2 0 - - -   GAD 7 : Generalized Anxiety Score 06/15/2021  Nervous, Anxious, on Edge 1  Control/stop worrying 0  Worry too much - different things 0  Trouble relaxing 0  Restless 0  Easily annoyed or irritable 0  Afraid - awful might happen 0  Total GAD 7 Score 1        Fall Risk  07/22/2018 12/20/2017 12/22/2016 06/12/2016 12/13/2015  Falls in the past year? No No No No No     Immunization History  Administered Date(s) Administered   Janssen (J&J) SARS-COV-2 Vaccination 01/16/2020   Tdap 10/16/2013   Zoster Recombinat (Shingrix) 10/05/2019, 07/16/2020   Zoster, Live 10/05/2019    No results found.  Past Medical History:  Diagnosis Date   Abdominal hernia 04/01/2020   Abnormal cervical Papanicolaou smear 04/01/2020   Chicken pox    Chronic back pain    Chronic neck pain    Family history of heart disease 02/25/2014   Fibromyalgia 05/15/2013   GERD (gastroesophageal reflux disease) 05/15/2013   Heart murmur    Hyperlipemia 05/15/2013   Hypothyroidism 05/15/2013   IBS (irritable bowel syndrome)    Osteopenia    Osteoporosis 05/15/2013   History of past treatment with  Forteo    Raynaud's disease 02/25/2014   Allergies  Allergen Reactions   Compazine [Prochlorperazine Edisylate] Other (See Comments)    Severe muscle requiring ER visit   Nsaids Nausea And Vomiting   Penicillins Hives   Prochlorperazine    Sulfamethoxazole-Trimethoprim    Sulfa Antibiotics Rash   Past Surgical History:  Procedure Laterality Date   COLONOSCOPY     TONSILLECTOMY     Family History  Problem Relation Age of Onset   Heart disease Mother    Arthritis Mother    Early death Mother 82   Heart attack Mother    Heart disease Father    Colon cancer Father    Hyperlipidemia Father    Hypertension Father    Heart disease Other        family  history    Brain cancer Sister    Early death Sister 29   Liver cancer Brother    Early death Brother 33   Kidney disease Paternal Grandmother    Hypertension Paternal Grandfather    Heart disease Brother    Heart attack Brother    Esophageal cancer Neg Hx    Pancreatic cancer Neg Hx    Stomach cancer Neg Hx    Social History   Social History Narrative   Marital status/children/pets: Married. One child.   Education/employment: Works as a Hospital doctor:      -Wears a bicycle helmet riding a bike: Yes     -smoke alarm in the home:Yes     - wears seatbelt: Yes     - Feels safe in their relationships: Yes    Allergies as of 06/15/2021       Reactions   Compazine [prochlorperazine Edisylate] Other (See Comments)   Severe muscle requiring ER visit   Nsaids Nausea And Vomiting   Penicillins Hives   Prochlorperazine    Sulfamethoxazole-trimethoprim    Sulfa Antibiotics Rash        Medication List        Accurate as of June 15, 2021 11:59 PM. If you have any questions, ask your nurse or doctor.          STOP taking these medications    CENTRUM SILVER PO Stopped by: Howard Pouch, DO       TAKE these medications    ALPRAZolam 0.5 MG tablet Commonly known as: XANAX Take 1 tablet (0.5 mg total) by mouth at bedtime as needed.   Biotin 1000 MCG tablet Take 1,000 mcg by mouth daily.   CALCIUM-CARB 600 + D PO Take by mouth. What changed: Another medication with the same name was removed. Continue taking this medication, and follow the directions you see here. Changed by: Howard Pouch, DO   clobetasol 0.05 % Gel Commonly known as: TEMOVATE Apply to affected area BID PRN   CO Q 10 PO Take 1 tablet by mouth daily.   estradiol 0.05 MG/24HR patch Commonly known as: VIVELLE-DOT estradiol 0.05 mg/24 hr semiweekly transdermal patch  APPLY 1 PATCH TWICE A WEEK   estradiol 0.075 MG/24HR Commonly known as: VIVELLE-DOT [place] 0.075 mg/24 hr  transdermal patch   KRILL OIL PO Take 1 tablet by mouth daily.   metroNIDAZOLE 0.75 % vaginal gel Commonly known as: METROGEL metronidazole 0.75 % (37.5 mg/5 gram) vaginal gel   morphine 50 MG 24 hr capsule Commonly known as: KADIAN Take 50 mg by mouth 2 (two) times daily.   morphine 30 MG 24 hr capsule Commonly known as:  KADIAN Take 30 mg by mouth in the morning, at noon, and at bedtime.   Praluent 75 MG/ML [place] Generic drug: Alirocumab Inject 1 mL into the skin every 14 (fourteen) days.   traMADol 50 MG tablet Commonly known as: ULTRAM Take 50 mg by mouth 2 (two) times daily.   UNABLE TO FIND Med Name: NitroBID cream - from rheumotology - topical for hands   VITAMIN B 12 PO Take 1 tablet by mouth daily.   zolpidem 12.5 MG CR tablet Commonly known as: AMBIEN CR Take 1 tablet (12.5 mg total) by mouth at bedtime as needed for sleep.        All past medical history, surgical history, allergies, family history, immunizations andmedications were updated in the EMR today and reviewed under the history and medication portions of their EMR.      ROS: 14 pt review of systems performed and negative (unless mentioned in an HPI)  Objective: BP 111/76   Pulse 95   Temp 98.4 F (36.9 C) (Oral)   Ht '5\' 5"'$  (1.651 m)   Wt 109 lb (49.4 kg)   SpO2 99%   BMI 18.14 kg/m  Gen: Afebrile. No acute distress.  HENT: AT. [place].  CV: RRR no murmur Chest: CTAB, no wheeze or crackles Skin: No rashes, purpura or petechiae.  Neuro:  Normal gait. PERLA. EOMi. Alert. Oriented x3 Psych: Normal affect, dress and demeanor. Normal speech. Normal thought content and judgment..    Assessment/plan: JAZMERE STAILEY is a 65 y.o. female present for  Primary insomnia Stable -  She is on multiple sedating medications. She denies any sedation and reports her pain management team are aware of her Ambien and her benzodiazepine use. She reports she typically does not use the benzodiazepine and the  Ambien at the same time. Continue Xanax 0.5 mg nightly as needed Continue zolpidem (AMBIEN CR) 12.5 MG CR tablet [place] controlled substance database was reviewed 06/16/21 - contract updated today\f/u 5.5 mos  Chronic neck pain/Chronic back pain, unspecified back location, unspecified back pain laterality/Cervicalgia Patient is prescribed tramadol and [place] through her pain management team.   *Return in about 6 months (around 12/05/2021) for [place] (30 min).  No orders of the defined types were placed in this encounter.  Meds ordered this encounter  Medications   ALPRAZolam (XANAX) 0.5 MG tablet    Sig: Take 1 tablet (0.5 mg total) by mouth at bedtime as needed.    Dispense:  90 tablet    Refill:  1   zolpidem (AMBIEN CR) 12.5 MG CR tablet    Sig: Take 1 tablet (12.5 mg total) by mouth at bedtime as needed for sleep.    Dispense:  90 tablet    Refill:  1    Referral Orders  No referral(s) requested today     Note is dictated utilizing voice recognition software. Although note has been proof read prior to signing, occasional typographical errors still can be missed. If any questions arise, please do not hesitate to call for verification.  Electronically signed by: Howard Pouch, DO [place]

## 2021-06-27 ENCOUNTER — Telehealth: Payer: Self-pay

## 2021-06-27 NOTE — Telephone Encounter (Signed)
Spoke with Anderson Malta at [employer]; new rule is they have to check when prescriber when certain combination of medications are being prescribed. In this instant, alprazolam and zolpidem ER. She asked pt's last OV and if f/u appt scheduled, if we do UDS or CSC, how long pt will have to remain on meds, if pain mgmt prescribes morphine. I advised of all information the best of my knowledge and what we had available.  [place], Lauren 12:15 PM Note Received a call from [place] at [place], they state there is a new rule that with the certain medications being prescribed by [facility] they need to know how they are following up with the patient. Requesting a returned phone call

## 2021-11-01 ENCOUNTER — Telehealth: Payer: Self-pay | Admitting: Pharmacist

## 2021-11-01 DIAGNOSIS — E782 Mixed hyperlipidemia: Secondary | ICD-10-CM

## 2021-11-01 DIAGNOSIS — Z8249 Family history of ischemic heart disease and other diseases of the circulatory system: Secondary | ICD-10-CM

## 2021-11-01 NOTE — Telephone Encounter (Signed)
Patient called stating she now has new insurance and needs PA done. She has [employer] ID # [id number] PA submitted- waiting on question response Pt made aware. SSN provided so we can apply for [employer] grant once approved.

## 2021-11-04 MED ORDER — PRALUENT 75 MG/ML ~~LOC~~ SOAJ: 1.0000 mL | SUBCUTANEOUS | 3 refills | Status: DC

## 2021-11-04 NOTE — Telephone Encounter (Signed)
PA for Praluent approved. [place] approved. Left detailed message on pt VM per DPR- requested call back.  CARD NO. [id number]   BIN [id number]   PCN [id number]   PC GROUP [id number]  I called info into [employer]. There are an issue getting the claim to go through [employer]. [employer] was going to call [employer].

## 2021-11-07 NOTE — Telephone Encounter (Signed)
Spoke with patient and she was made aware that the grant was called into [employer] and to follow up with them.

## 2021-12-05 ENCOUNTER — Ambulatory Visit: Payer: Self-pay | Admitting: Family Medicine

## 2021-12-27 ENCOUNTER — Other Ambulatory Visit: Payer: Self-pay

## 2021-12-28 ENCOUNTER — Encounter: Payer: Self-pay | Admitting: Family Medicine

## 2021-12-28 ENCOUNTER — Ambulatory Visit (INDEPENDENT_AMBULATORY_CARE_PROVIDER_SITE_OTHER): Payer: Medicare Other | Admitting: Family Medicine

## 2021-12-28 VITALS — BP 115/72 | HR 93 | Temp 97.4°F | Ht 65.0 in | Wt 113.0 lb

## 2021-12-28 DIAGNOSIS — I7300 Raynaud's syndrome without gangrene: Secondary | ICD-10-CM | POA: Diagnosis not present

## 2021-12-28 DIAGNOSIS — E782 Mixed hyperlipidemia: Secondary | ICD-10-CM

## 2021-12-28 DIAGNOSIS — F5101 Primary insomnia: Secondary | ICD-10-CM

## 2021-12-28 DIAGNOSIS — M542 Cervicalgia: Secondary | ICD-10-CM

## 2021-12-28 MED ORDER — ZOLPIDEM TARTRATE ER 12.5 MG PO TBCR: 12.5000 mg | EXTENDED_RELEASE_TABLET | Freq: Every evening | ORAL | 1 refills | Status: DC | PRN

## 2021-12-28 MED ORDER — ALPRAZOLAM 0.5 MG PO TABS: 0.5000 mg | ORAL_TABLET | Freq: Every evening | ORAL | 1 refills | Status: DC | PRN

## 2021-12-28 NOTE — Progress Notes (Signed)
? ? ? ?Patient ID: Wanda Robertson, female  DOB: 09-25-1956, 66 y.o.   MRN: [id number] ?Patient Care Team  ?  Relationship Specialty Notifications Start End  ?Ma Hillock, DO PCP - General Family Medicine  07/16/20   ?Dorothy Spark, MD PCP - Cardiology Cardiology Admissions 10/15/18   ?Mauri Pole, MD Consulting Physician Gastroenterology  07/21/20   ?Christain Sacramento, OD Referring Physician Optometry  07/21/20   ?Linda Hedges, DO Consulting Physician Obstetrics and Gynecology  07/21/20   ?Nicholaus Bloom, MD  Anesthesiology  07/21/20   ? Comment: pain management  ? ? ?Chief Complaint  ?Patient presents with  ? Insomnia  ?  [facility]; pt is not fasting  ? ? ? ?Subjective: ?Wanda Robertson is a 66 y.o.  female present for [facility] ?Primary insomnia ?Patient reports the Ambien 12.5 mg nightly is working well for her insomnia.  Occasionally she will use the Xanax 0.5 mg nightly if she is unable to fall asleep.  She denies complications or oversedation ?Prior note: ?Patient reports she has been prescribed Ambien 12.5 mg nightly as needed for insomnia for many years. She also is prescribed Xanax 0.5 mg nightly as needed for insomnia. She states she does not take both of these at the same time typically. Depending upon her sleep pattern. She is prescribed tramadol and morphine through her pain specialist. She feels she is doing much better. She states they are aware of her Ambien and benzodiazepine use. She denies any sedation with these medications. ? ?Chronic neck pain/Chronic back pain, unspecified back location, unspecified back pain laterality/Cervicalgia ?Patient is prescribed morphine and tramadol through her pain management specialist. ? ?Depression screen [facility] 2/9 12/28/2021 06/15/2021 01/21/2020 07/22/2018 12/20/2017  ?Decreased Interest 0 0 0 0 0  ?Down, Depressed, Hopeless 0 0 0 0 0  ?PHQ - 2 Score 0 0 0 0 0  ?Altered sleeping 1 1 0 - -  ?Tired, decreased energy 1 1 0 - -  ?Change in appetite 0 0 0 - -  ?Feeling bad  or failure about yourself  0 0 0 - -  ?Trouble concentrating 0 0 0 - -  ?Moving slowly or fidgety/restless 0 0 0 - -  ?Suicidal thoughts 0 0 0 - -  ?PHQ-9 Score 2 2 0 - -  ? ?GAD 7 : Generalized Anxiety Score 12/28/2021 06/15/2021  ?Nervous, Anxious, on Edge 0 1  ?Control/stop worrying 0 0  ?Worry too much - different things 0 0  ?Trouble relaxing 1 0  ?Restless 0 0  ?Easily annoyed or irritable 0 0  ?Afraid - awful might happen 0 0  ?Total GAD 7 Score 1 1  ? ? ?  ?  ?Fall Risk  12/25/2021 07/22/2018 12/20/2017 12/22/2016 06/12/2016  ?Falls in the past year? 0 No No No No  ? ? ? ?Immunization History  ?Administered Date(s) Administered  ? Janssen (J&J) SARS-COV-2 Vaccination 01/16/2020  ? Tdap 10/16/2013  ? Zoster Recombinat (Shingrix) 10/05/2019, 07/16/2020  ? Zoster, Live 10/05/2019  ? ? ?No results found. ? ?Past Medical History:  ?Diagnosis Date  ? Abdominal hernia 04/01/2020  ? Abnormal cervical Papanicolaou smear 04/01/2020  ? Chicken pox   ? Chronic back pain   ? Chronic neck pain   ? Family history of heart disease 02/25/2014  ? Fibromyalgia 05/15/2013  ? GERD (gastroesophageal reflux disease) 05/15/2013  ? Heart murmur   ? Hyperlipemia 05/15/2013  ? Hypothyroidism 05/15/2013  ? IBS (irritable bowel syndrome)   ?  Osteopenia   ? Osteoporosis 05/15/2013  ? History of past treatment with Forteo   ? Raynaud's disease 02/25/2014  ? ?Allergies  ?Allergen Reactions  ? Compazine [Prochlorperazine Edisylate] Other (See Comments)  ?  Severe muscle requiring ER visit  ? Nsaids Nausea And Vomiting  ? Penicillins Hives  ? Prochlorperazine   ? Sulfamethoxazole-Trimethoprim   ? Sulfa Antibiotics Rash  ? ?Past Surgical History:  ?Procedure Laterality Date  ? COLONOSCOPY    ? TONSILLECTOMY    ? ?Family History  ?Problem Relation Age of Onset  ? Heart disease Mother   ? Arthritis Mother   ? Early death Mother 80  ? Heart attack Mother   ? Heart disease Father   ? Colon cancer Father   ? Hyperlipidemia Father   ? Hypertension Father   ? Heart  disease Other   ?     family history   ? Brain cancer Sister   ? Early death Sister 25  ? Liver cancer Brother   ? Early death Brother 45  ? Kidney disease Paternal Grandmother   ? Hypertension Paternal Grandfather   ? Heart disease Brother   ? Heart attack Brother   ? Esophageal cancer Neg Hx   ? Pancreatic cancer Neg Hx   ? Stomach cancer Neg Hx   ? ?Social History  ? ?Social History Narrative  ? Marital status/children/pets: Married. One child.  ? Education/employment: Works as a Electronics engineer   ? Safety:   ?   -Wears a bicycle helmet riding a bike: Yes  ?   -smoke alarm in the home:Yes  ?   - wears seatbelt: Yes  ?   - Feels safe in their relationships: Yes  ? ? ?Allergies as of 12/28/2021   ? ?   Reactions  ? Compazine [prochlorperazine Edisylate] Other (See Comments)  ? Severe muscle requiring ER visit  ? Nsaids Nausea And Vomiting  ? Penicillins Hives  ? Prochlorperazine   ? Sulfamethoxazole-trimethoprim   ? Sulfa Antibiotics Rash  ? ?  ? ?  ?Medication List  ?  ? ?  ? Accurate as of December 28, 2021 11:59 PM. If you have any questions, ask your nurse or doctor.  ?  ?  ? ?  ? ?STOP taking these medications   ? ?CALCIUM-CARB 600 + D PO ?Stopped by: Howard Pouch, DO ?  ? ?  ? ?TAKE these medications   ? ?ALPRAZolam 0.5 MG tablet ?Commonly known as: Duanne Moron ?Take 1 tablet (0.5 mg total) by mouth at bedtime as needed. ?  ?Biotin 1000 MCG tablet ?Take 1,000 mcg by mouth daily. ?  ?clobetasol 0.05 % Gel ?Commonly known as: TEMOVATE ?Apply to affected area BID PRN ?  ?CO Q 10 PO ?Take 1 tablet by mouth daily. ?  ?estradiol 0.075 MG/24HR ?Commonly known as: VIVELLE-DOT ?Lyllana 0.075 mg/24 hr transdermal patch ?What changed: Another medication with the same name was removed. Continue taking this medication, and follow the directions you see here. ?Changed by: Howard Pouch, DO ?  ?KRILL OIL PO ?Take 1 tablet by mouth daily. ?  ?metroNIDAZOLE 0.75 % vaginal gel ?Commonly known as: METROGEL ?metronidazole 0.75 % (37.5 mg/5  gram) vaginal gel ?  ?morphine 30 MG 24 hr capsule ?Commonly known as: KADIAN ?Take 30 mg by mouth in the morning, at noon, and at bedtime. ?What changed: Another medication with the same name was removed. Continue taking this medication, and follow the directions you see here. ?Changed by: Howard Pouch, DO ?  ?  NITRO-BID TD ?Place onto the skin. ?  ?Praluent 75 MG/ML Soaj ?Generic drug: Alirocumab ?Inject 1 mL into the skin every 14 (fourteen) days. ?  ?traMADol 50 MG tablet ?Commonly known as: ULTRAM ?Take 50 mg by mouth 2 (two) times daily. ?  ?UNABLE TO FIND ?Med Name: NitroBID cream - from rheumotology - topical for hands ?  ?VITAMIN B 12 PO ?Take 1 tablet by mouth daily. ?  ?zolpidem 12.5 MG CR tablet ?Commonly known as: AMBIEN CR ?Take 1 tablet (12.5 mg total) by mouth at bedtime as needed for sleep. ?  ? ?  ? ? ?All past medical history, surgical history, allergies, family history, immunizations andmedications were updated in the EMR today and reviewed under the history and medication portions of their EMR.    ? ? ?ROS: 14 pt review of systems performed and negative (unless mentioned in an HPI) ? ?Objective: ?BP 115/72   Pulse 93   Temp (!) 97.4 ?F (36.3 ?C) (Oral)   Ht '5\' 5"'$  (1.651 m)   Wt 113 lb (51.3 kg)   SpO2 99%   BMI 18.80 kg/m?  ?Physical Exam ?Vitals and nursing note reviewed.  ?Constitutional:   ?   General: She is not in acute distress. ?   Appearance: Normal appearance. She is normal weight. She is not ill-appearing or toxic-appearing.  ?HENT:  ?   Head: Normocephalic and atraumatic.  ?Eyes:  ?   Extraocular Movements: Extraocular movements intact.  ?   Conjunctiva/sclera: Conjunctivae normal.  ?   Pupils: Pupils are equal, round, and reactive to light.  ?Cardiovascular:  ?   Rate and Rhythm: Normal rate and regular rhythm.  ?   Heart sounds: No murmur heard. ?Pulmonary:  ?   Effort: Pulmonary effort is normal. No respiratory distress.  ?   Breath sounds: Normal breath sounds. No wheezing,  rhonchi or rales.  ?Musculoskeletal:  ?   Right lower leg: No edema.  ?   Left lower leg: No edema.  ?Neurological:  ?   Mental Status: She is alert and oriented to person, place, and time. Mental status is at ba

## 2021-12-29 LAB — CBC
HCT: 37.3 % (ref 35.0–45.0)
Hemoglobin: 12.4 g/dL (ref 11.7–15.5)
MCH: 31.8 pg (ref 27.0–33.0)
MCHC: 33.2 g/dL (ref 32.0–36.0)
MCV: 95.6 fL (ref 80.0–100.0)
MPV: 11.1 fL (ref 7.5–12.5)
Platelets: 223 10*3/uL (ref 140–400)
RBC: 3.9 10*6/uL (ref 3.80–5.10)
RDW: 12 % (ref 11.0–15.0)
WBC: 5.8 10*3/uL (ref 3.8–10.8)

## 2021-12-29 LAB — COMPREHENSIVE METABOLIC PANEL
AG Ratio: 1.7 (calc) (ref 1.0–2.5)
ALT: 14 U/L (ref 6–29)
AST: 19 U/L (ref 10–35)
Albumin: 4 g/dL (ref 3.6–5.1)
Alkaline phosphatase (APISO): 36 U/L — ABNORMAL LOW (ref 37–153)
BUN: 10 mg/dL (ref 7–25)
CO2: 28 mmol/L (ref 20–32)
Calcium: 9.2 mg/dL (ref 8.6–10.4)
Chloride: 104 mmol/L (ref 98–110)
Creat: 0.75 mg/dL (ref 0.50–1.05)
Globulin: 2.3 g/dL (calc) (ref 1.9–3.7)
Glucose, Bld: 62 mg/dL — ABNORMAL LOW (ref 65–99)
Potassium: 4.4 mmol/L (ref 3.5–5.3)
Sodium: 139 mmol/L (ref 135–146)
Total Bilirubin: 0.4 mg/dL (ref 0.2–1.2)
Total Protein: 6.3 g/dL (ref 6.1–8.1)

## 2021-12-29 LAB — TSH: TSH: 4.51 mIU/L — ABNORMAL HIGH (ref 0.40–4.50)

## 2022-01-28 ENCOUNTER — Encounter: Payer: Self-pay | Admitting: Family Medicine

## 2022-01-30 MED ORDER — LEVOTHYROXINE SODIUM 25 MCG PO TABS: 25.0000 ug | ORAL_TABLET | Freq: Every day | ORAL | 0 refills | Status: DC

## 2022-01-30 NOTE — Telephone Encounter (Signed)
Please advise 

## 2022-01-30 NOTE — Telephone Encounter (Signed)
Unfortunately, we need a consistent regimen for 6-8 weeks in order to be able to guide her on what is appropriate for her. ?I will call in the levothyroxine 25 mcg tablets to her [facility].  I recommend she stays on this dose daily for the next 6 to 8 weeks and then follow-up with lab appointment only to have levels checked to see if we need to adjust dose. ?

## 2022-03-23 ENCOUNTER — Other Ambulatory Visit: Payer: Self-pay | Admitting: Obstetrics & Gynecology

## 2022-03-23 DIAGNOSIS — R928 Other abnormal and inconclusive findings on diagnostic imaging of breast: Secondary | ICD-10-CM

## 2022-04-05 ENCOUNTER — Other Ambulatory Visit: Payer: Medicare Other

## 2022-04-10 NOTE — Telephone Encounter (Signed)
Patient returning call.  She is not sure what it is about, possible to set up lab appt??  I could not find any documentation.  Please advise.  [phone]

## 2022-04-21 ENCOUNTER — Ambulatory Visit: Payer: BLUE CROSS/BLUE SHIELD

## 2022-04-25 ENCOUNTER — Ambulatory Visit
Admission: RE | Admit: 2022-04-25 | Discharge: 2022-04-25 | Disposition: A | Discharge status: Home / Self Care | Payer: Medicare Other | Source: Ambulatory Visit | Attending: Obstetrics & Gynecology | Admitting: Obstetrics & Gynecology

## 2022-04-25 ENCOUNTER — Other Ambulatory Visit: Payer: Self-pay | Admitting: Obstetrics & Gynecology

## 2022-04-25 DIAGNOSIS — R928 Other abnormal and inconclusive findings on diagnostic imaging of breast: Secondary | ICD-10-CM

## 2022-04-25 DIAGNOSIS — N6310 Unspecified lump in the right breast, unspecified quadrant: Secondary | ICD-10-CM

## 2022-04-25 NOTE — Telephone Encounter (Signed)
Rec'd voicemail from patient to resch lab appt from 7/7 when office was closed due to no provider on staff.  I called and left a message for patient to contact office to reschedule lab appt

## 2022-04-26 ENCOUNTER — Ambulatory Visit (INDEPENDENT_AMBULATORY_CARE_PROVIDER_SITE_OTHER): Payer: Medicare Other

## 2022-04-26 DIAGNOSIS — Z0000 Encounter for general adult medical examination without abnormal findings: Secondary | ICD-10-CM

## 2022-04-26 NOTE — Patient Instructions (Signed)
Wanda Robertson , Thank you for taking time to come for your Medicare Wellness Visit. I appreciate your ongoing commitment to your health goals. Please review the following plan we discussed and let me know if I can assist you in the future.   Screening recommendations/referrals: Colonoscopy: pt stated she will follow up this year  Mammogram: done 04/25/22 repeat every  year  Bone Density: Done 03/24/21 repeat every 2 year Recommended yearly ophthalmology/optometry visit for glaucoma screening and checkup Recommended yearly dental visit for hygiene and checkup  Vaccinations: Influenza vaccine: declined  Pneumococcal vaccine: declined  Tdap vaccine: done 10/16/13 repeat every 10 years  Shingles vaccine: completed 10/05/19, 07/16/20   Covid-19:completed 01/16/20  Advanced directives: Please bring a copy of your health care power of attorney and living will to the office at your convenience.  Conditions/risks identified: none at this time   Next appointment: Follow up in one year for your annual wellness visit    Preventive Care 65 Years and Older, Female Preventive care refers to lifestyle choices and visits with your health care provider that can promote health and wellness. What does preventive care include? A yearly physical exam. This is also called an annual well check. Dental exams once or twice a year. Routine eye exams. Ask your health care provider how often you should have your eyes checked. Personal lifestyle choices, including: Daily care of your teeth and gums. Regular physical activity. Eating a healthy diet. Avoiding tobacco and drug use. Limiting alcohol use. Practicing safe sex. Taking low-dose aspirin every day. Taking vitamin and mineral supplements as recommended by your health care provider. What happens during an annual well check? The services and screenings done by your health care provider during your annual well check will depend on your age, overall health, lifestyle  risk factors, and family history of disease. Counseling  Your health care provider may ask you questions about your: Alcohol use. Tobacco use. Drug use. Emotional well-being. Home and relationship well-being. Sexual activity. Eating habits. History of falls. Memory and ability to understand (cognition). Work and work Statistician. Reproductive health. Screening  You may have the following tests or measurements: Height, weight, and BMI. Blood pressure. Lipid and cholesterol levels. These may be checked every 5 years, or more frequently if you are over 30 years old. Skin check. Lung cancer screening. You may have this screening every year starting at age 24 if you have a 30-pack-year history of smoking and currently smoke or have quit within the past 15 years. Fecal occult blood test (FOBT) of the stool. You may have this test every year starting at age 29. Flexible sigmoidoscopy or colonoscopy. You may have a sigmoidoscopy every 5 years or a colonoscopy every 10 years starting at age 61. Hepatitis C blood test. Hepatitis B blood test. Sexually transmitted disease (STD) testing. Diabetes screening. This is done by checking your blood sugar (glucose) after you have not eaten for a while (fasting). You may have this done every 1-3 years. Bone density scan. This is done to screen for osteoporosis. You may have this done starting at age 56. Mammogram. This may be done every 1-2 years. Talk to your health care provider about how often you should have regular mammograms. Talk with your health care provider about your test results, treatment options, and if necessary, the need for more tests. Vaccines  Your health care provider may recommend certain vaccines, such as: Influenza vaccine. This is recommended every year. Tetanus, diphtheria, and acellular pertussis (Tdap, Td) vaccine.  You may need a Td booster every 10 years. Zoster vaccine. You may need this after age 57. Pneumococcal  13-valent conjugate (PCV13) vaccine. One dose is recommended after age 17. Pneumococcal polysaccharide (PPSV23) vaccine. One dose is recommended after age 57. Talk to your health care provider about which screenings and vaccines you need and how often you need them. This information is not intended to replace advice given to you by your health care provider. Make sure you discuss any questions you have with your health care provider. Document Released: 10/29/2015 Document Revised: 06/21/2016 Document Reviewed: 08/03/2015 Elsevier Interactive Patient Education  2017 [place] Prevention in the Home Falls can cause injuries. They can happen to people of all ages. There are many things you can do to make your home safe and to help prevent falls. What can I do on the outside of my home? Regularly fix the edges of walkways and driveways and fix any cracks. Remove anything that might make you trip as you walk through a door, such as a raised step or threshold. Trim any bushes or trees on the path to your home. Use bright outdoor lighting. Clear any walking paths of anything that might make someone trip, such as rocks or tools. Regularly check to see if handrails are loose or broken. Make sure that both sides of any steps have handrails. Any raised decks and porches should have guardrails on the edges. Have any leaves, snow, or ice cleared regularly. Use sand or salt on walking paths during winter. Clean up any spills in your garage right away. This includes oil or grease spills. What can I do in the bathroom? Use night lights. Install grab bars by the toilet and in the tub and shower. Do not use towel bars as grab bars. Use non-skid mats or decals in the tub or shower. If you need to sit down in the shower, use a plastic, non-slip stool. Keep the floor dry. Clean up any water that spills on the floor as soon as it happens. Remove soap buildup in the tub or shower regularly. Attach  bath mats securely with double-sided non-slip rug tape. Do not have throw rugs and other things on the floor that can make you trip. What can I do in the bedroom? Use night lights. Make sure that you have a light by your bed that is easy to reach. Do not use any sheets or blankets that are too big for your bed. They should not hang down onto the floor. Have a firm chair that has side arms. You can use this for support while you get dressed. Do not have throw rugs and other things on the floor that can make you trip. What can I do in the kitchen? Clean up any spills right away. Avoid walking on wet floors. Keep items that you use a lot in easy-to-reach places. If you need to reach something above you, use a strong step stool that has a grab bar. Keep electrical cords out of the way. Do not use floor polish or wax that makes floors slippery. If you must use wax, use non-skid floor wax. Do not have throw rugs and other things on the floor that can make you trip. What can I do with my stairs? Do not leave any items on the stairs. Make sure that there are handrails on both sides of the stairs and use them. Fix handrails that are broken or loose. Make sure that handrails are as long as  the stairways. Check any carpeting to make sure that it is firmly attached to the stairs. Fix any carpet that is loose or worn. Avoid having throw rugs at the top or bottom of the stairs. If you do have throw rugs, attach them to the floor with carpet tape. Make sure that you have a light switch at the top of the stairs and the bottom of the stairs. If you do not have them, ask someone to add them for you. What else can I do to help prevent falls? Wear shoes that: Do not have high heels. Have rubber bottoms. Are comfortable and fit you well. Are closed at the toe. Do not wear sandals. If you use a stepladder: Make sure that it is fully opened. Do not climb a closed stepladder. Make sure that both sides of the  stepladder are locked into place. Ask someone to hold it for you, if possible. Clearly mark and make sure that you can see: Any grab bars or handrails. First and last steps. Where the edge of each step is. Use tools that help you move around (mobility aids) if they are needed. These include: Canes. Walkers. Scooters. Crutches. Turn on the lights when you go into a dark area. Replace any light bulbs as soon as they burn out. Set up your furniture so you have a clear path. Avoid moving your furniture around. If any of your floors are uneven, fix them. If there are any pets around you, be aware of where they are. Review your medicines with your doctor. Some medicines can make you feel dizzy. This can increase your chance of falling. Ask your doctor what other things that you can do to help prevent falls. This information is not intended to replace advice given to you by your health care provider. Make sure you discuss any questions you have with your health care provider. Document Released: 07/29/2009 Document Revised: 03/09/2016 Document Reviewed: 11/06/2014 Elsevier Interactive Patient Education  2017 [employer].

## 2022-04-26 NOTE — Progress Notes (Signed)
Virtual Visit via Telephone Note  I connected with  Wanda Robertson on 04/26/22 at 10:15 AM EDT by telephone and verified that I am speaking with the correct person using two identifiers.  Medicare Annual Wellness visit completed telephonically due to Covid-19 pandemic.   Persons participating in this call: This Health Coach and this patient.   Location: Patient: Home Provider: Office    I discussed the limitations, risks, security and privacy concerns of performing an evaluation and management service by telephone and the availability of in person appointments. The patient expressed understanding and agreed to proceed.  Unable to perform video visit due to video visit attempted and failed and/or patient does not have video capability.   Some vital signs may be absent or patient reported.   Willette Brace, LPN   Subjective:   Wanda Robertson is a 66 y.o. female who presents for an Initial Medicare Annual Wellness Visit.  Review of Systems     Cardiac Risk Factors include: advanced age (>63mn, >>48women);dyslipidemia     Objective:    There were no vitals filed for this visit. There is no height or weight on file to calculate BMI.     04/26/2022   10:13 AM  Advanced Directives  Does Patient Have a Medical Advance Directive? Yes  Type of Advance [place]ive [place]? No - copy requested    Current Medications (verified) Outpatient Encounter Medications as of 04/26/2022  Medication Sig   Alirocumab (PRALUENT) 75 MG/ML SOAJ Inject 1 mL into the skin every 14 (fourteen) days.   ALPRAZolam (XANAX) 0.5 MG tablet Take 1 tablet (0.5 mg total) by mouth at bedtime as needed.   Biotin 1000 MCG tablet Take 1,000 mcg by mouth daily.    clobetasol (TEMOVATE) 0.05 % GEL Apply to affected area BID PRN   Coenzyme Q10 (CO Q 10 PO) Take 1 tablet by mouth daily.    Cyanocobalamin (VITAMIN B 12 PO) Take 1 tablet by  mouth daily.    estradiol (VIVELLE-DOT) 0.075 MG/24HR [place] 0.075 mg/24 hr transdermal patch   gabapentin (NEURONTIN) 100 MG capsule    KRILL OIL PO Take 1 tablet by mouth daily.    levothyroxine (SYNTHROID) 25 MCG tablet Take 1 tablet (25 mcg total) by mouth daily.   metroNIDAZOLE (METROGEL) 0.75 % vaginal gel metronidazole 0.75 % (37.5 mg/5 gram) vaginal gel   morphine (KADIAN) 30 MG 24 hr capsule Take 30 mg by mouth in the morning, at noon, and at bedtime.   traMADol (ULTRAM) 50 MG tablet Take 50 mg by mouth 2 (two) times daily.   UNABLE TO FIND Med Name: NitroBID cream - from rheumotology - topical for hands   zolpidem (AMBIEN CR) 12.5 MG CR tablet Take 1 tablet (12.5 mg total) by mouth at bedtime as needed for sleep.   Nitroglycerin (NITRO-BID TD) Place onto the skin. (Patient not taking: Reported on 04/26/2022)   No facility-administered encounter medications on file as of 04/26/2022.    Allergies (verified) Compazine [prochlorperazine edisylate], Nsaids, Penicillins, Prochlorperazine, Sulfamethoxazole-trimethoprim, and Sulfa antibiotics   History: Past Medical History:  Diagnosis Date   Abdominal hernia 04/01/2020   Abnormal cervical Papanicolaou smear 04/01/2020   Chicken pox    Chronic back pain    Chronic neck pain    Family history of heart disease 02/25/2014   Fibromyalgia 05/15/2013   GERD (gastroesophageal reflux disease) 05/15/2013   Heart murmur  Hyperlipemia 05/15/2013   Hypothyroidism 05/15/2013   IBS (irritable bowel syndrome)    Osteopenia    Osteoporosis 05/15/2013   History of past treatment with Forteo    Raynaud's disease 02/25/2014   Past Surgical History:  Procedure Laterality Date   COLONOSCOPY     TONSILLECTOMY     Family History  Problem Relation Age of Onset   Heart disease Mother    Arthritis Mother    Early death Mother 29   Heart attack Mother    Heart disease Father    Colon cancer Father    Hyperlipidemia Father    Hypertension Father     Heart disease Other        family history    Brain cancer Sister    Early death Sister 36   Liver cancer Brother    Early death Brother 28   Kidney disease Paternal Grandmother    Hypertension Paternal Grandfather    Heart disease Brother    Heart attack Brother    Esophageal cancer Neg Hx    Pancreatic cancer Neg Hx    Stomach cancer Neg Hx    Social History   Socioeconomic History   Marital status: Married    Spouse name: Not on file   Number of children: 1   Years of education: Not on file   Highest education level: Associate degree: occupational, Hotel manager, or vocational program  Occupational History   Not on file  Tobacco Use   Smoking status: Former   Smokeless tobacco: Never   Tobacco comments:    socially  Scientific laboratory technician Use: Never used  Substance and Sexual Activity   Alcohol use: No   Drug use: Yes    Types: Benzodiazepines, Morphine    Comment: prescribed tramadol, xanax, ambien  and morphine   Sexual activity: Yes    Partners: Male  Other Topics Concern   Not on file  Social History Narrative   Marital status/children/pets: Married. One child.   Education/employment: Works as a Hospital doctor:      -Wears a bicycle helmet riding a bike: Yes     -smoke alarm in the home:Yes     - wears seatbelt: Yes     - Feels safe in their relationships: Yes   Social Determinants of Health   Financial Resource Strain: Low Risk  (04/26/2022)   Overall Financial Resource Strain (CARDIA)    Difficulty of Paying Living Expenses: Not hard at all  Food Insecurity: No Food Insecurity (04/26/2022)   Hunger Vital Sign    Worried About Running Out of Food in the Last Year: Never true    [place] in the Last Year: Never true  Transportation Needs: No Transportation Needs (04/26/2022)   PRAPARE - Hydrologist (Medical): No    Lack of Transportation (Non-Medical): No  Physical Activity: Sufficiently Active (04/26/2022)    Exercise Vital Sign    Days of Exercise per Week: 4 days    Minutes of Exercise per Session: 60 min  Stress: No Stress Concern Present (04/26/2022)   [place]    Feeling of Stress : Not at all  Social Connections: Moderately Isolated (04/26/2022)   Social Connection and Isolation Panel [NHANES]    Frequency of Communication with Friends and Family: More than three times a week    Frequency of Social Gatherings with Friends and Family: Twice a week  Attends Religious Services: Never    Active Member of Clubs or Organizations: No    Attends Music therapist: Never    Marital Status: Married    Tobacco Counseling Counseling given: Not Answered Tobacco comments: socially   Clinical Intake:  Pre-visit preparation completed: Yes  Pain : No/denies pain     BMI - recorded: 18.8 Nutritional Status: BMI <19  Underweight Nutritional Risks: None Diabetes: No  How often do you need to have someone help you when you read instructions, pamphlets, or other written materials from your doctor or pharmacy?: 1 - Never  Diabetic?no  Interpreter Needed?: No  Information entered by :: Charlott Rakes, LPN   Activities of Daily Living    04/26/2022   10:15 AM 03/27/2022    2:32 PM  In your present state of health, do you have any difficulty performing the following activities:  Hearing? 0 0  Vision? 0 0  Difficulty concentrating or making decisions? 0 0  Walking or climbing stairs? 0 0  Dressing or bathing? 0 0  Doing errands, shopping? 0 0  Preparing Food and eating ? N N  Using the Toilet? N N  In the past six months, have you accidently leaked urine? N N  Do you have problems with loss of bowel control? N N  Managing your Medications? N N  Managing your Finances? N N  Housekeeping or managing your Housekeeping? N N    Patient Care Team: Ma Hillock, DO as PCP - General (Family  Medicine) Dorothy Spark, MD as PCP - Cardiology (Cardiology) Mauri Pole, MD as Consulting Physician (Gastroenterology) Christain [place], OD as Referring Physician (Optometry) Linda Hedges, DO as Consulting Physician (Obstetrics and Gynecology) Nicholaus Bloom, MD (Anesthesiology)  Indicate any recent Medical Services you may have received from other than [facility] providers in the past year (date may be approximate).     Assessment:   This is a routine wellness examination for [place]a.  Hearing/Vision screen Hearing Screening - Comments:: Pt denies any hearing issues  Vision Screening - Comments:: Pt follows  up with dr Jodene Nam for annual ey exams   Dietary issues and exercise activities discussed: Current Exercise Habits: Home exercise routine;Structured exercise class, Type of exercise: Other - see comments (Pilates), Time (Minutes): 60, Frequency (Times/Week): 4, Weekly Exercise (Minutes/Week): 240   Goals Addressed             This Visit's Progress    Patient Stated       None at this time       Depression Screen    04/26/2022   10:12 AM 12/28/2021    1:48 PM 06/15/2021    1:53 PM 01/21/2020    1:06 PM 07/22/2018    3:03 PM 12/20/2017    2:11 PM 12/22/2016    2:14 PM  PHQ 2/9 Scores  PHQ - 2 Score 0 0 0 0 0 0 0  PHQ- 9 Score  2 2 0       Fall Risk    04/26/2022   10:15 AM 03/27/2022    2:32 PM 12/25/2021    9:54 PM 07/22/2018    3:03 PM 12/20/2017    2:11 PM  [place] in the past year? 0 0 0 No No  Number falls in past yr: 0 0     Injury with Fall? 0      Risk for fall due to : Impaired vision  Follow up Falls prevention discussed        FALL RISK PREVENTION PERTAINING TO THE HOME:  Any stairs in or around the home? Yes  If so, are there any without handrails? No  Home free of loose throw rugs in walkways, pet beds, electrical cords, etc? Yes  Adequate lighting in your home to reduce risk of falls? Yes   ASSISTIVE DEVICES UTILIZED TO  PREVENT FALLS:  Life alert? No  Use of a cane, walker or w/c? No  Grab bars in the bathroom? No  Shower chair or bench in shower? No  Elevated toilet seat or a handicapped toilet? No   TIMED UP AND GO:  Was the test performed? No .  Cognitive Function:        04/26/2022   10:16 AM  6CIT Screen  What Year? 0 points  What month? 0 points  What time? 0 points  Count back from 20 0 points  Months in reverse 0 points  Repeat phrase 0 points  Total Score 0 points    Immunizations Immunization History  Administered Date(s) Administered   Janssen (J&J) SARS-COV-2 Vaccination 01/16/2020   Tdap 10/16/2013   Zoster Recombinat (Shingrix) 10/05/2019, 07/16/2020   Zoster, Live 10/05/2019    TDAP status: Up to date  Flu Vaccine status: Declined, Education has been provided regarding the importance of this vaccine but patient still declined. Advised may receive this vaccine at local pharmacy or Health Dept. Aware to provide a copy of the vaccination record if obtained from local pharmacy or Health Dept. Verbalized acceptance and understanding.  Pneumococcal vaccine status: Declined,  Education has been provided regarding the importance of this vaccine but patient still declined. Advised may receive this vaccine at local pharmacy or Health Dept. Aware to provide a copy of the vaccination record if obtained from local pharmacy or Health Dept. Verbalized acceptance and understanding.   Covid-19 vaccine status: Completed vaccines  Qualifies for Shingles Vaccine? Yes   Zostavax completed Yes   Shingrix Completed?: Yes  Screening Tests Health Maintenance  Topic Date Due   COVID-19 Vaccine (2 - Janssen risk series) 02/13/2020   COLONOSCOPY (Pts 45-67yr Insurance coverage will need to be confirmed)  06/15/2022 (Originally 11/20/2017)   Hepatitis C Screening  12/23/2022 (Originally 08/09/1974)   HIV Screening  12/23/2022 (Originally 08/10/1971)   Pneumonia Vaccine 66 Years old (1 - PCV)  12/29/2022 (Originally 08/09/2021)   INFLUENZA VACCINE  05/16/2022   DEXA SCAN  03/25/2023   TETANUS/TDAP  10/17/2023   PAP SMEAR-Modifier  02/14/2024   MAMMOGRAM  04/25/2024   Zoster Vaccines- Shingrix  Completed   HPV VACCINES  Aged Out    Health Maintenance  Health Maintenance Due  Topic Date Due   COVID-19 Vaccine (2 - Janssen risk series) 02/13/2020    Colorectal cancer screening: Type of screening: Colonoscopy. Completed 2/5/9. Repeat every 10 years pt stated she will follow up this year   Mammogram status: Completed 04/25/22. Repeat every year  Bone Density status: Completed 03/24/21. Results reflect: Bone density results: OSTEOPOROSIS. Repeat every 2 years.    Additional Screening:  Hepatitis C Screening: does qualify  Vision Screening: Recommended annual ophthalmology exams for early detection of glaucoma and other disorders of the eye. Is the patient up to date with their annual eye exam?  Yes  Who is the provider or what is the name of the office in which the patient attends annual eye exams? Dr SMarzetta Boardpalmer  If pt is not established with a  provider, would they like to be referred to a provider to establish care? No .   Dental Screening: Recommended annual dental exams for proper oral hygiene  Community Resource Referral / Chronic Care Management: CRR required this visit?  No   CCM required this visit?  No      Plan:     I have personally reviewed and noted the following in the patient's chart:   Medical and social history Use of alcohol, tobacco or illicit drugs  Current medications and supplements including opioid prescriptions. Patient is currently taking opioid prescriptions. Information provided to patient regarding non-opioid alternatives. Patient advised to discuss non-opioid treatment plan with their provider. Functional ability and status Nutritional status Physical activity Advanced directives List of other physicians Hospitalizations,  surgeries, and ER visits in previous 12 months Vitals Screenings to include cognitive, depression, and falls Referrals and appointments  In addition, I have reviewed and discussed with patient certain preventive protocols, quality metrics, and best practice recommendations. A written personalized care plan for preventive services as well as general preventive health recommendations were provided to patient.     Willette Brace, LPN   03/01/16   Nurse Notes: none

## 2022-04-28 ENCOUNTER — Other Ambulatory Visit (INDEPENDENT_AMBULATORY_CARE_PROVIDER_SITE_OTHER): Payer: Medicare Other

## 2022-04-28 ENCOUNTER — Telehealth: Payer: Self-pay | Admitting: Family Medicine

## 2022-04-28 DIAGNOSIS — E039 Hypothyroidism, unspecified: Secondary | ICD-10-CM

## 2022-04-28 LAB — TSH: TSH: 1.28 u[IU]/mL (ref 0.35–5.50)

## 2022-04-28 MED ORDER — LEVOTHYROXINE SODIUM 25 MCG PO TABS: 25.0000 ug | ORAL_TABLET | Freq: Every day | ORAL | 3 refills | Status: DC

## 2022-04-28 NOTE — Telephone Encounter (Signed)
Please inform patient her thyroid levels look great.  I have refilled her medicine to her [facility]

## 2022-05-01 NOTE — Telephone Encounter (Signed)
LVM for pt to CB regarding results.  

## 2022-05-02 NOTE — Telephone Encounter (Signed)
LVM for pt to CB regarding results.  

## 2022-05-03 NOTE — Telephone Encounter (Signed)
Spoke with pt regarding labs and instructions.   

## 2022-05-05 ENCOUNTER — Ambulatory Visit
Admission: RE | Admit: 2022-05-05 | Discharge: 2022-05-05 | Disposition: A | Discharge status: Home / Self Care | Payer: Medicare Other | Source: Ambulatory Visit | Attending: Obstetrics & Gynecology | Admitting: Obstetrics & Gynecology

## 2022-05-05 DIAGNOSIS — N6310 Unspecified lump in the right breast, unspecified quadrant: Secondary | ICD-10-CM

## 2022-06-12 ENCOUNTER — Encounter: Payer: Self-pay | Admitting: Family Medicine

## 2022-06-12 ENCOUNTER — Ambulatory Visit (INDEPENDENT_AMBULATORY_CARE_PROVIDER_SITE_OTHER): Payer: Medicare Other | Admitting: Family Medicine

## 2022-06-12 VITALS — BP 116/79 | HR 94 | Temp 98.2°F | Wt 114.8 lb

## 2022-06-12 DIAGNOSIS — F5101 Primary insomnia: Secondary | ICD-10-CM | POA: Diagnosis not present

## 2022-06-12 MED ORDER — ALPRAZOLAM 0.5 MG PO TABS
0.5000 mg | ORAL_TABLET | Freq: Every evening | ORAL | 1 refills | Status: DC | PRN
Start: 2022-06-12 — End: 2023-02-01

## 2022-06-12 MED ORDER — ZOLPIDEM TARTRATE ER 12.5 MG PO TBCR: 12.5000 mg | EXTENDED_RELEASE_TABLET | Freq: Every evening | ORAL | 1 refills | Status: DC | PRN

## 2022-06-12 NOTE — Patient Instructions (Signed)
No follow-ups on file.        Great to see you today.  I have refilled the medication(s) we provide.   If labs were collected, we will inform you of lab results once received either by echart message or telephone call.   - echart message- for normal results that have been seen by the patient already.   - telephone call: abnormal results or if patient has not viewed results in their echart.  

## 2022-06-12 NOTE — Progress Notes (Signed)
Patient ID: Wanda Robertson, female  DOB: September 12, 1956, 66 y.o.   MRN: [id number] Patient Care Team    Relationship Specialty Notifications Start End  Ma Hillock, DO PCP - General Family Medicine  07/16/20   Dorothy Spark, MD PCP - Cardiology Cardiology Admissions 10/15/18   Mauri Pole, MD Consulting Physician Gastroenterology  07/21/20   Christain Sacramento, [place] Referring Physician Optometry  07/21/20   Linda Hedges, [place] Physician Obstetrics and Gynecology  07/21/20   Nicholaus Bloom, MD  Anesthesiology  07/21/20    Comment: pain management    Chief Complaint  Patient presents with   Insomnia    Pt is not fasting     Subjective: Wanda Robertson is a 66 y.o.  female present for [facility] Primary insomnia Patient reports the Ambien 12.5 mg nightly is working well for her insomnia.  Occasionally she will use the Xanax 0.5 mg nightly if she is unable to fall asleep.  She denies complications or oversedation Prior note: Patient reports she has been prescribed Ambien 12.5 mg nightly as needed for insomnia for many years. She also is prescribed Xanax 0.5 mg nightly as needed for insomnia. She states she does not take both of these at the same time typically. Depending upon her sleep pattern. She is prescribed tramadol and morphine through her pain specialist. She feels she is doing much better. She states they are aware of her Ambien and benzodiazepine use. She denies any sedation with these medications.  Chronic neck pain/Chronic back pain, unspecified back location, unspecified back pain laterality/Cervicalgia Patient is prescribed morphine and tramadol through her pain management specialist.     06/12/2022    2:28 PM 04/26/2022   10:12 AM 12/28/2021    1:48 PM 06/15/2021    1:53 PM 01/21/2020    1:06 PM  Depression screen PHQ 2/9  Decreased Interest 0 0 0 0 0  Down, Depressed, Hopeless 0 0 0 0 0  PHQ - 2 Score 0 0 0 0 0  Altered sleeping '1  1 1 '$ 0  Tired,  decreased energy '1  1 1 '$ 0  Change in appetite 0  0 0 0  Feeling bad or failure about yourself  0  0 0 0  Trouble concentrating 0  0 0 0  Moving slowly or fidgety/restless 0  0 0 0  Suicidal thoughts 0  0 0 0  PHQ-9 Score '2  2 2 '$ 0      06/12/2022    2:28 PM 12/28/2021    1:48 PM 06/15/2021    1:53 PM  GAD 7 : Generalized Anxiety Score  Nervous, Anxious, on Edge 0 0 1  Control/stop worrying 0 0 0  Worry too much - different things 0 0 0  Trouble relaxing 1 1 0  Restless 0 0 0  Easily annoyed or irritable 0 0 0  Afraid - awful might happen 0 0 0  Total GAD 7 Score '1 1 1           '$ 04/26/2022   10:15 AM 03/27/2022    2:32 PM 12/25/2021    9:54 PM 07/22/2018    3:03 PM 12/20/2017    2:11 PM  Fall Risk   Falls in the past year? 0 0 0 No No  Number falls in past yr: 0 0     Injury with Fall? 0      Risk for fall due to : Impaired vision  Follow up Falls prevention discussed         Immunization History  Administered Date(s) Administered   Janssen (J&J) SARS-COV-2 Vaccination 01/16/2020   Tdap 10/16/2013   Zoster Recombinat (Shingrix) 10/05/2019, 07/16/2020   Zoster, Live 10/05/2019    No results found.  Past Medical History:  Diagnosis Date   Abdominal hernia 04/01/2020   Abnormal cervical Papanicolaou smear 04/01/2020   Chicken pox    Chronic back pain    Chronic neck pain    Family history of heart disease 02/25/2014   Fibromyalgia 05/15/2013   GERD (gastroesophageal reflux disease) 05/15/2013   Heart murmur    Hyperlipemia 05/15/2013   Hypothyroidism 05/15/2013   IBS (irritable bowel syndrome)    Osteopenia    Osteoporosis 05/15/2013   History of past treatment with Forteo    Raynaud's disease 02/25/2014   Allergies  Allergen Reactions   Compazine [Prochlorperazine Edisylate] Other (See Comments)    Severe muscle requiring ER visit   Nsaids Nausea And Vomiting and Nausea Only   Penicillins Hives   Prochlorperazine    Sulfamethoxazole-Trimethoprim    Sulfa  Antibiotics Rash   Past Surgical History:  Procedure Laterality Date   COLONOSCOPY     TONSILLECTOMY     Family History  Problem Relation Age of Onset   Heart disease Mother    Arthritis Mother    Early death Mother 48   Heart attack Mother    Heart disease Father    Colon cancer Father    Hyperlipidemia Father    Hypertension Father    Heart disease Other        family history    Brain cancer Sister    Early death Sister 21   Liver cancer Brother    Early death Brother 94   Kidney disease Paternal Grandmother    Hypertension Paternal Grandfather    Heart disease Brother    Heart attack Brother    Esophageal cancer Neg Hx    Pancreatic cancer Neg Hx    Stomach cancer Neg Hx    Social History   Social History Narrative   Marital status/children/pets: Married. One child.   Education/employment: Works as a Hospital doctor:      -Wears a bicycle helmet riding a bike: Yes     -smoke alarm in the home:Yes     - wears seatbelt: Yes     - Feels safe in their relationships: Yes    Allergies as of 06/12/2022       Reactions   Compazine [prochlorperazine Edisylate] Other (See Comments)   Severe muscle requiring ER visit   Nsaids Nausea And Vomiting, Nausea Only   Penicillins Hives   Prochlorperazine    Sulfamethoxazole-trimethoprim    Sulfa Antibiotics Rash        Medication List        Accurate as of June 12, 2022  4:51 PM. If you have any questions, ask your nurse or doctor.          ALPRAZolam 0.5 MG tablet Commonly known as: XANAX Take 1 tablet (0.5 mg total) by mouth at bedtime as needed.   Biotin 1000 MCG tablet Take 1,000 mcg by mouth daily.   clobetasol 0.05 % Gel Commonly known as: TEMOVATE Apply to affected area BID PRN   CO Q 10 PO Take 1 tablet by mouth daily.   estradiol 0.075 MG/24HR Commonly known as: VIVELLE-DOT Lyllana 0.075 mg/24 hr transdermal patch   gabapentin 100 MG capsule  Commonly known as: NEURONTIN   KRILL  OIL PO Take 1 tablet by mouth daily.   levothyroxine 25 MCG tablet Commonly known as: SYNTHROID Take 1 tablet (25 mcg total) by mouth daily.   metroNIDAZOLE 0.75 % vaginal gel Commonly known as: METROGEL metronidazole 0.75 % (37.5 mg/5 gram) vaginal gel   morphine 30 MG 24 hr capsule Commonly known as: KADIAN Take 30 mg by mouth in the morning, at noon, and at bedtime.   MS Contin 30 MG 12 hr tablet Generic drug: morphine Take 30 mg by mouth every 8 (eight) hours.   NITRO-BID TD Place onto the skin.   Praluent 75 MG/ML Soaj Generic drug: Alirocumab Inject 1 mL into the skin every 14 (fourteen) days.   traMADol 50 MG tablet Commonly known as: ULTRAM 1 tablet as needed Orally Once a day What changed: Another medication with the same name was removed. Continue taking this medication, and follow the directions you see here. Changed by: Howard Pouch, DO   [place] Name: NitroBID cream - from rheumotology - topical for hands   VITAMIN B 12 PO Take 1 tablet by mouth daily.   zolpidem 12.5 MG CR tablet Commonly known as: AMBIEN CR Take 1 tablet (12.5 mg total) by mouth at bedtime as needed for sleep.        All past medical history, surgical history, allergies, family history, immunizations andmedications were updated in the EMR today and reviewed under the history and medication portions of their EMR.      ROS: 14 pt review of systems performed and negative (unless mentioned in an HPI)  Objective: BP 116/79   Pulse 94   Temp 98.2 F (36.8 C)   Wt 114 lb 12.8 oz (52.1 kg)   SpO2 100%   BMI 19.10 kg/m  Physical Exam Vitals and nursing note reviewed.  Constitutional:      General: She is not in acute distress.    Appearance: Normal appearance. She is normal weight. She is not ill-appearing or toxic-appearing.  HENT:     Head: Normocephalic and atraumatic.  Eyes:     Extraocular Movements: Extraocular movements intact.     Conjunctiva/sclera:  Conjunctivae normal.     Pupils: Pupils are equal, round, and reactive to light.  Cardiovascular:     Rate and Rhythm: Normal rate and regular rhythm.     Heart sounds: No murmur heard. Pulmonary:     Effort: Pulmonary effort is normal. No respiratory distress.     Breath sounds: Normal breath sounds. No wheezing, rhonchi or rales.  Musculoskeletal:     Right lower leg: No edema.     Left lower leg: No edema.  Neurological:     Mental Status: She is alert and oriented to person, place, and time. Mental status is at baseline.  Psychiatric:        Mood and Affect: Mood normal.        Behavior: Behavior normal.        Thought Content: Thought content normal.        Judgment: Judgment normal.    Assessment/plan: Wanda Robertson is a 66 y.o. female present for  Primary insomnia Stable -  She is on multiple sedating medications. She denies any sedation and reports her pain management team are aware of her Ambien and her benzodiazepine use. She reports she typically does not use the benzodiazepine and the Ambien at the same time. Continue Xanax 0.5 mg nightly as needed Continue  zolpidem (AMBIEN CR) 12.5 MG CR tablet [place] controlled substance database was reviewed 06/12/22  Chronic neck pain/Chronic back pain, unspecified back location, unspecified back pain laterality/Cervicalgia Patient is prescribed tramadol and [place] through her pain management team.  Mixed hyperlipidemia Diet controlled  Raynaud's disease without gangrene Managed by rheumatology  *Return in about 26 weeks (around 12/11/2022).  No orders of the defined types were placed in this encounter.   Meds ordered this encounter  Medications   zolpidem (AMBIEN CR) 12.5 MG CR tablet    Sig: Take 1 tablet (12.5 mg total) by mouth at bedtime as needed for sleep.    Dispense:  90 tablet    Refill:  1   ALPRAZolam (XANAX) 0.5 MG tablet    Sig: Take 1 tablet (0.5 mg total) by mouth at bedtime as needed.     Dispense:  90 tablet    Refill:  1    Referral Orders  No referral(s) requested today     Note is dictated utilizing voice recognition software. Although note has been proof read prior to signing, occasional typographical errors still can be missed. If any questions arise, please do not hesitate to call for verification.  Electronically signed by: Howard Pouch, DO [place]

## 2022-09-04 ENCOUNTER — Encounter: Payer: Self-pay | Admitting: Pharmacist

## 2022-09-04 ENCOUNTER — Other Ambulatory Visit: Payer: Self-pay | Admitting: Pharmacist

## 2022-09-04 DIAGNOSIS — E782 Mixed hyperlipidemia: Secondary | ICD-10-CM

## 2022-09-04 DIAGNOSIS — Z8249 Family history of ischemic heart disease and other diseases of the circulatory system: Secondary | ICD-10-CM

## 2022-09-04 MED ORDER — PRALUENT 75 MG/ML ~~LOC~~ SOAJ: 1.0000 mL | SUBCUTANEOUS | 3 refills | Status: DC

## 2022-09-18 ENCOUNTER — Telehealth: Payer: Self-pay | Admitting: Pharmacist

## 2022-09-18 NOTE — Telephone Encounter (Signed)
Patient called and LVM asking if she chose a different medicare plan that covers [place] and not Praluent, could her grant be used for Reaptha instead. Called pt and LVM per pt request that she can use [employer] Grant for either Repatha or Praluent, no new approval is needed.

## 2022-10-07 ENCOUNTER — Telehealth: Payer: Medicare Other | Admitting: Urgent Care

## 2022-10-07 DIAGNOSIS — U071 COVID-19: Secondary | ICD-10-CM

## 2022-10-07 MED ORDER — MOLNUPIRAVIR EUA 200MG CAPSULE: 4.0000 | ORAL_CAPSULE | Freq: Two times a day (BID) | ORAL | 0 refills | Status: DC

## 2022-10-07 MED ORDER — MOLNUPIRAVIR EUA 200MG CAPSULE: 4.0000 | ORAL_CAPSULE | Freq: Two times a day (BID) | ORAL | 0 refills | Status: AC

## 2022-10-07 NOTE — Addendum Note (Signed)
Addended by: Geryl Councilman on: 10/07/2022 07:31 PM   Modules accepted: Orders

## 2022-10-07 NOTE — Progress Notes (Signed)
E-Visit  for Positive Covid Test Result  We are sorry you are not feeling well. We are here to help!  You have tested positive for COVID-19, meaning that you were infected with the novel coronavirus and could give the virus to others.  It is vitally important that you stay home so you do not spread it to others.      Please continue isolation at home, for at least 10 days since the start of your symptoms and until you have had 24 hours with no fever (without taking a fever reducer) and with improving of symptoms.  If you have no symptoms but tested positive (or all symptoms resolve after 5 days and you have no fever) you can leave your house but continue to wear a mask around others for an additional 5 days. If you have a fever,continue to stay home until you have had 24 hours of no fever. Most cases improve 5-10 days from onset but we have seen a small number of patients who have gotten worse after the 10 days.  Please be sure to watch for worsening symptoms and remain taking the proper precautions.   Go to the nearest hospital ED for assessment if fever/cough/breathlessness are severe or illness seems like a threat to life.    The following symptoms may appear 2-14 days after exposure: Fever Cough Shortness of breath or difficulty breathing Chills Repeated shaking with chills Muscle pain Headache Sore throat New loss of taste or smell Fatigue Congestion or runny nose Nausea or vomiting Diarrhea   As we discussed on the phone, I have called in [place]. Take 4 tabs twice daily for 5 days.  You may also take acetaminophen (Tylenol) as needed for fever.  HOME CARE: Only take medications as instructed by your medical team. Drink plenty of fluids and get plenty of rest. A steam or ultrasonic humidifier can help if you have congestion.   GET HELP RIGHT AWAY IF YOU HAVE EMERGENCY WARNING SIGNS.  Call 911 or proceed to your closest emergency facility if: You develop worsening  high fever. Trouble breathing Bluish lips or face Persistent pain or pressure in the chest New confusion Inability to wake or stay awake You cough up blood. Your symptoms become more severe Inability to hold down food or fluids  This list is not all possible symptoms. Contact your medical provider for any symptoms that are severe or concerning to you.    Your e-visit answers were reviewed by a board certified advanced clinical practitioner to complete your personal care plan.  Depending on the condition, your plan could have included both over the counter or prescription medications.  If there is a problem please reply once you have received a response from your provider.  Your safety is important to [place].  If you have drug allergies check your prescription carefully.    You can use MyChart to ask questions about today's visit, request a non-urgent call back, or ask for a work or school excuse for 24 hours related to this e-Visit. If it has been greater than 24 hours you will need to follow up with your provider, or enter a new e-Visit to address those concerns. You will get an e-mail in the next two days asking about your experience.  I hope that your e-visit has been valuable and will speed your recovery. Thank you for using e-visits.    Please note this patient was notified of the need to perform a video encounter. Pt  messaged stating there was a Emergency planning/management officer and she was unable to get the video visit scheduled. Provider thus called patient on the telephone and this was completed via a phone conversation. Pt appeared well and stable. ER precautions were reviewed.  I have spent 12 minutes in review of e-visit questionnaire, review and updating patient chart, medical decision making and response to patient, in light of having a phone conversation.  [place], [place]

## 2022-10-07 NOTE — Progress Notes (Signed)
   Thank you for the details you included in the comment boxes. Those details are very helpful in determining the best course of treatment for you and help [place] to provide the best care. It is required for all patients requesting antiviral therapy that you convert this visit to a video visit in order for the provider to better assess what is going on.  The provider will be able to give you a more accurate diagnosis and treatment plan if we can more freely discuss your symptoms and with the addition of a virtual examination.   If you convert to a video visit, we will bill your insurance (similar to an office visit) and you will not be charged for this e-Visit. You will be able to stay at home and speak with the first available [facility] advanced practice provider. The link to do a video visit is in the drop down Menu tab of your Welcome screen in [place].

## 2022-10-07 NOTE — Addendum Note (Signed)
Addended by: Geryl Councilman on: 10/07/2022 08:36 PM   Modules accepted: Orders

## 2022-10-30 NOTE — Progress Notes (Deleted)
Cardiology Office Note:    Date:  10/30/2022   ID:  Wanda Robertson, DOB Dec 29, 1955, MRN [id number]  PCP:  Ma Hillock, DO   [facility] Cardiologist:  Ena Dawley, MD {   Referring MD: Ma Hillock, DO     History of Present Illness:    Wanda Robertson is a 67 y.o. female with a hx of CAD with calcium score 503 (97% for age, gender matched controls), fibromyalgia, and HLD who was previously followed by Dr. Meda Coffee who now returns to clinic for follow-up.  Per review of the record, the patient's calcium core was 20 in 2015 and 503 in 2021.  Last seen in 04/2021 where she was doing well. Tolerating praluent.  Today, ***   Past Medical History:  Diagnosis Date   Abdominal hernia 04/01/2020   Abnormal cervical Papanicolaou smear 04/01/2020   Chicken pox    Chronic back pain    Chronic neck pain    Family history of heart disease 02/25/2014   Fibromyalgia 05/15/2013   GERD (gastroesophageal reflux disease) 05/15/2013   Heart murmur    Hyperlipemia 05/15/2013   Hypothyroidism 05/15/2013   IBS (irritable bowel syndrome)    Osteopenia    Osteoporosis 05/15/2013   History of past treatment with [facility]    Raynaud's disease 02/25/2014    Past Surgical History:  Procedure Laterality Date   COLONOSCOPY     TONSILLECTOMY      Current Medications: No outpatient medications have been marked as taking for the 11/01/22 encounter (Appointment) with Freada Bergeron, MD.     Allergies:   Compazine [prochlorperazine edisylate], Nsaids, Penicillins, Prochlorperazine, Sulfamethoxazole-trimethoprim, and Sulfa antibiotics   Social History   Socioeconomic History   Marital status: Married    Spouse name: Not on file   Number of children: 1   Years of education: Not on file   Highest education level: Associate degree: occupational, Hotel manager, or vocational program  Occupational History   Not on file  Tobacco Use   Smoking status: Former    Smokeless tobacco: Never   Tobacco comments:    socially  Scientific laboratory technician Use: Never used  Substance and Sexual Activity   Alcohol use: No   Drug use: Yes    Types: Benzodiazepines, Morphine    Comment: prescribed tramadol, xanax, ambien  and morphine   Sexual activity: Yes    Partners: Male  Other Topics Concern   Not on file  Social History Narrative   Marital status/children/pets: Married. One child.   Education/employment: Works as a Hospital doctor:      -Wears a bicycle helmet riding a bike: Yes     -smoke alarm in the home:Yes     - wears seatbelt: Yes     - Feels safe in their relationships: Yes   Social Determinants of Health   Financial Resource Strain: Low Risk  (04/26/2022)   Overall Financial Resource Strain (CARDIA)    Difficulty of Paying Living Expenses: Not hard at all  Food Insecurity: No Food Insecurity (04/26/2022)   Hunger Vital Sign    Worried About Running Out of Food in the Last Year: Never true    [place] in the Last Year: Never true  Transportation Needs: No Transportation Needs (04/26/2022)   PRAPARE - Hydrologist (Medical): No    Lack of Transportation (Non-Medical): No  Physical Activity: Sufficiently Active (04/26/2022)   Exercise Vital  Sign    Days of Exercise per Week: 4 days    Minutes of Exercise per Session: 60 min  Stress: No Stress Concern Present (04/26/2022)   [place]    Feeling of Stress : Not at all  Social Connections: Moderately Isolated (04/26/2022)   Social Connection and Isolation Panel [NHANES]    Frequency of Communication with Friends and Family: More than three times a week    Frequency of Social Gatherings with Friends and Family: Twice a week    Attends Religious Services: Never    Marine scientist or Organizations: No    Attends Music therapist: Never    Marital Status: Married      Family History: The patient's family history includes Arthritis in her mother; Brain cancer in her sister; Colon cancer in her father; Early death (age of onset: 18) in her mother; Early death (age of onset: 56) in her brother; Early death (age of onset: 46) in her sister; Heart attack in her brother and mother; Heart disease in her brother, father, mother, and another family member; Hyperlipidemia in her father; Hypertension in her father and paternal grandfather; Kidney disease in her paternal grandmother; Liver cancer in her brother. There is no history of Esophageal cancer, Pancreatic cancer, or Stomach cancer.  ROS:   Review of Systems  Constitutional:  Negative for chills and malaise/fatigue.  HENT:  Negative for ear pain.   Eyes:  Negative for blurred vision and redness.  Respiratory:  Negative for cough and shortness of breath.   Cardiovascular:  Negative for chest pain, palpitations, orthopnea, claudication, leg swelling and PND.  Gastrointestinal:  Negative for abdominal pain, blood in stool, constipation, heartburn, nausea and vomiting.  Genitourinary:  Negative for dysuria.  Musculoskeletal:  Negative for back pain and joint pain.  Neurological:  Negative for dizziness, tingling, tremors and headaches.  Endo/Heme/Allergies:  Negative for polydipsia. Does not bruise/bleed easily.  Psychiatric/Behavioral:  Negative for depression. The patient does not have insomnia.      EKGs/Labs/Other Studies Reviewed:    The following studies were reviewed today:  ECHO 07/12/2020:  IMPRESSIONS  1. Left ventricular ejection fraction, by estimation, is 55 to 60%. The  left ventricle has normal function. The left ventricle has no regional  wall motion abnormalities. There is mild left ventricular hypertrophy.  Left ventricular diastolic parameters  were normal.   2. In the apical 4 chamber view, the RVEF appears mildly down, but the S'  and TAPSE are normal. IN other windows, function  appears normal. The right  ventricular size is normal. There is mildly elevated pulmonary artery  systolic pressure.   3. Right atrial size was mildly dilated.   4. The mitral valve is normal in structure. Mild mitral valve  regurgitation.   5. TR jet is directed anterior into RA . Tricuspid valve regurgitation is  mild to moderate.   6. The aortic valve is abnormal. Aortic valve regurgitation is not  visualized. Mild aortic valve sclerosis is present, with no evidence of  aortic valve stenosis.   7. The inferior vena cava is normal in size with greater than 50%  respiratory variability, suggesting right atrial pressure of 3 mmHg.   TTE 07/12/20: IMPRESSIONS   1. Left ventricular ejection fraction, by estimation, is 55 to 60%. The  left ventricle has normal function. The left ventricle has no regional  wall motion abnormalities. There is mild left ventricular hypertrophy.  Left  ventricular diastolic parameters  were normal.   2. In the apical 4 chamber view, the RVEF appears mildly down, but the S'  and TAPSE are normal. IN other windows, function appears normal. The right  ventricular size is normal. There is mildly elevated pulmonary artery  systolic pressure.   3. Right atrial size was mildly dilated.   4. The mitral valve is normal in structure. Mild mitral valve  regurgitation.   5. TR jet is directed anterior into RA . Tricuspid valve regurgitation is  mild to moderate.   6. The aortic valve is abnormal. Aortic valve regurgitation is not  visualized. Mild aortic valve sclerosis is present, with no evidence of  aortic valve stenosis.   7. The inferior vena cava is normal in size with greater than 50%  respiratory variability, suggesting right atrial pressure of 3 mmHg.   Cardiac monitor 06/2020: Patient had a min HR of 61 bpm, max HR of 143 bpm, and avg HR of 80 BPM. 2 Supraventricular Tachycardia runs occurred, the longest run lasting 12 beats. Rare PACs and PVC.    Predominant underlying rhythm was Sinus Rhythm. 2 very short runs of Supraventricular Tachycardia occurred. The patient didn't record any symptoms.  Coronary Calcium Score 2021: 503 EXAM: Coronary Calcium Score   TECHNIQUE: The patient was scanned on a [employer]. Axial non-contrast 3 mm slices were carried out through the heart. The data set was analyzed on a dedicated work station and scored using the [place].   FINDINGS: Non-cardiac: See separate report from [facility].   Ascending Aorta: Normal size, no calcifications.   Pericardium: Normal.   Coronary arteries: Normal origin.  EKG:   04/28/2021: NSR, HR 92 bpm  Recent Labs: 12/28/2021: ALT 14; BUN 10; Creat 0.75; Hemoglobin 12.4; Platelets 223; Potassium 4.4; Sodium 139 04/28/2022: TSH 1.28  Recent Lipid Panel    Component Value Date/Time   CHOL 118 04/20/2021 1643   TRIG 46 04/20/2021 1643   TRIG 72 05/15/2013 1748   HDL 65 04/20/2021 1643   HDL 43 05/15/2013 1748   CHOLHDL 1.8 04/20/2021 1643   LDLCALC 42 04/20/2021 1643   LDLCALC 88 05/15/2013 1748     Risk Assessment/Calculations:           Physical Exam:    VS:  There were no vitals taken for this visit.    Wt Readings from Last 3 Encounters:  06/12/22 114 lb 12.8 oz (52.1 kg)  12/28/21 113 lb (51.3 kg)  06/15/21 109 lb (49.4 kg)     GEN:  Well nourished, well developed in no acute distress HEENT: Normal NECK: No JVD; No carotid bruits CARDIAC: RRR, no murmurs, rubs, gallops RESPIRATORY:  Clear to auscultation without rales, wheezing or rhonchi  ABDOMEN: Soft, non-tender, non-distended MUSCULOSKELETAL:  No edema; No deformity  SKIN: Warm and dry NEUROLOGIC:  Alert and oriented x 3 PSYCHIATRIC:  Normal affect   ASSESSMENT:    No diagnosis found.  PLAN:    In order of problems listed above:  #Coronary artery disease without angina: #Coronary Artery Calcification: Calcium score 503 (97% for age, gender  matched controls). Doing well with no anginal symptoms. Remains active. -Continue praluent '75mg'$  q14days  #HLD: -Continue praluent '75mg'$  q14days    Medication Adjustments/Labs and Tests Ordered: Current medicines are reviewed at length with the patient today.  Concerns regarding medicines are outlined above.  No orders of the defined types were placed in this encounter.   No orders of the defined  types were placed in this encounter.   There are no Patient Instructions on file for this visit.     Signed, Freada Bergeron, MD  10/30/2022 2:39 PM    [place]

## 2022-11-01 ENCOUNTER — Telehealth: Payer: Self-pay | Admitting: Pharmacist

## 2022-11-01 ENCOUNTER — Ambulatory Visit: Payer: Medicare Other | Attending: Cardiology | Admitting: Cardiology

## 2022-11-01 ENCOUNTER — Encounter: Payer: Self-pay | Admitting: Cardiology

## 2022-11-01 VITALS — BP 120/84 | HR 92 | Ht 65.0 in | Wt 116.2 lb

## 2022-11-01 DIAGNOSIS — I7300 Raynaud's syndrome without gangrene: Secondary | ICD-10-CM

## 2022-11-01 DIAGNOSIS — E782 Mixed hyperlipidemia: Secondary | ICD-10-CM

## 2022-11-01 DIAGNOSIS — Z8249 Family history of ischemic heart disease and other diseases of the circulatory system: Secondary | ICD-10-CM

## 2022-11-01 DIAGNOSIS — I2510 Atherosclerotic heart disease of native coronary artery without angina pectoris: Secondary | ICD-10-CM

## 2022-11-01 DIAGNOSIS — Z789 Other specified health status: Secondary | ICD-10-CM

## 2022-11-01 MED ORDER — NITROGLYCERIN 2 % TD OINT: 0.5000 [in_us] | TOPICAL_OINTMENT | Freq: Two times a day (BID) | TRANSDERMAL | Status: DC | PRN

## 2022-11-01 MED ORDER — NITRO-BID 2 % TD OINT: 0.5000 [in_us] | TOPICAL_OINTMENT | Freq: Two times a day (BID) | TRANSDERMAL | 4 refills | Status: DC | PRN

## 2022-11-01 MED ORDER — REPATHA SURECLICK 140 MG/ML ~~LOC~~ SOAJ: 1.0000 | SUBCUTANEOUS | 11 refills | Status: DC

## 2022-11-01 NOTE — Patient Instructions (Signed)
Medication Instructions:   Your physician recommends that you continue on your current medications as directed. Please refer to the Current Medication list given to you today.  *If you need a refill on your cardiac medications before your next appointment, please call your pharmacy*   Lab Work:  THIS WEEK OR NEXT--CHECK LIPIDS--PLEASE COME FASTING TO THIS LAB APPOINTMENT  If you have labs (blood work) drawn today and your tests are completely normal, you will receive your results only by: [place] (if you have MyChart) OR A paper copy in the mail If you have any lab test that is abnormal or we need to change your treatment, we will call you to review the results.   Follow-Up: At [facility], you and your health needs are our priority.  As part of our continuing mission to provide you with exceptional heart care, we have created designated Provider Care Teams.  These Care Teams include your primary Cardiologist (physician) and Advanced Practice Providers (APPs -  Physician Assistants and Nurse Practitioners) who all work together to provide you with the care you need, when you need it.  We recommend signing up for the patient portal called "MyChart".  Sign up information is provided on this After Visit Summary.  MyChart is used to connect with patients for Virtual Visits (Telemedicine).  Patients are able to view lab/test results, encounter notes, upcoming appointments, etc.  Non-urgent messages can be sent to your provider as well.   To learn more about what you can do with MyChart, go to [email or web address].    Your next appointment:   6 month(s)  Provider:   DR. Johney Frame

## 2022-11-01 NOTE — Telephone Encounter (Signed)
Pt seen by MD today, reports insurance no longer covers Praluent and now covers Repatha.  Submitted PA for Repatha, Key: [id number]  Will send in new rx once PA is approved.

## 2022-11-01 NOTE — Telephone Encounter (Signed)
PA approved through 11/02/23, refill sent in.

## 2022-11-01 NOTE — Progress Notes (Signed)
Cardiology Office Note:    Date:  11/01/2022   ID:  Wanda Robertson, DOB [birth year], MRN [id number]  PCP:  Ma Hillock, DO   [facility] Cardiologist:  Ena Dawley, MD {  Referring MD: Ma Hillock, DO    History of Present Illness:    Wanda Robertson is a 67 y.o. female with a hx of CAD with calcium score 503 (97% for age, gender matched controls), fibromyalgia, and HLD who was previously followed by Dr. Meda Coffee who now returns to clinic for follow-up.  Per review of the record, the patient's calcium score was 20 in 2015 and 503 in 2021.  Last seen in 04/2021 where she was doing well. Tolerating praluent.  Today, the patient states she is doing very well. Requests a new prescription for Nitro-Bid for management of Raynaud's disease. Usually she uses this more often in the winter months, once or twice a day prn.   Otherwise she is feeling well. Typically she participates in pilates 5 times a week and feels well with activity. States with very heavy exertion, she can feel discomfort with her chest. This is very rare and she is overall very active. She declined stress testing but will let us know if this recurs or worsens.    As of 04/2021 her LDL was 42. She notes that she is switching to Repatha due to her insurance.   She denies any palpitations, or peripheral edema. No lightheadedness, headaches, syncope, orthopnea, or PND.   Past Medical History:  Diagnosis Date   Abdominal hernia 04/01/2020   Abnormal cervical Papanicolaou smear 04/01/2020   Chicken pox    Chronic back pain    Chronic neck pain    Family history of heart disease 02/25/2014   Fibromyalgia 05/15/2013   GERD (gastroesophageal reflux disease) 05/15/2013   Heart murmur    Hyperlipemia 05/15/2013   Hypothyroidism 05/15/2013   IBS (irritable bowel syndrome)    Osteopenia    Osteoporosis 05/15/2013   History of past treatment with Forteo    Raynaud's disease 02/25/2014    Past  Surgical History:  Procedure Laterality Date   COLONOSCOPY     TONSILLECTOMY      Current Medications: Current Meds  Medication Sig   ALPRAZolam (XANAX) 0.5 MG tablet Take 1 tablet (0.5 mg total) by mouth at bedtime as needed.   Biotin 1000 MCG tablet Take 1,000 mcg by mouth daily.    Coenzyme Q10 (CO Q 10 PO) Take 1 tablet by mouth daily.    Cyanocobalamin (VITAMIN B 12 PO) Take 1 tablet by mouth daily.    estradiol (VIVELLE-DOT) 0.075 MG/24HR Lyllana 0.075 mg/24 hr transdermal patch   KRILL OIL PO Take 1 tablet by mouth daily.    levothyroxine (SYNTHROID) 25 MCG tablet Take 1 tablet (25 mcg total) by mouth daily.   morphine (KADIAN) 30 MG 24 hr capsule Take 30 mg by mouth in the morning, at noon, and at bedtime.   MS CONTIN 30 MG 12 hr tablet Take 30 mg by mouth every 8 (eight) hours.   nitroGLYCERIN (NITRO-BID) 2 % ointment Apply 0.5 inches topically 2 (two) times daily as needed for chest pain (raynauds).   traMADol (ULTRAM) 50 MG tablet 1 tablet as needed Orally Once a day   valACYclovir (VALTREX) 500 MG tablet Take 500 mg by mouth daily.   zolpidem (AMBIEN CR) 12.5 MG CR tablet Take 1 tablet (12.5 mg total) by mouth at bedtime as needed for sleep.   [  DISCONTINUED] Alirocumab (PRALUENT) 75 MG/ML SOAJ Inject 1 mL into the skin every 14 (fourteen) days.   [DISCONTINUED] Nitroglycerin (NITRO-BID TD) Place onto the skin.     Allergies:   Compazine [prochlorperazine edisylate], Nsaids, Penicillins, Prochlorperazine, Sulfamethoxazole-trimethoprim, and Sulfa antibiotics   Social History   Socioeconomic History   Marital status: Married    Spouse name: Not on file   Number of children: 1   Years of education: Not on file   Highest education level: Associate degree: occupational, Hotel manager, or vocational program  Occupational History   Not on file  Tobacco Use   Smoking status: Former   Smokeless tobacco: Never   Tobacco comments:    socially  Scientific laboratory technician Use: Never  used  Substance and Sexual Activity   Alcohol use: No   Drug use: Yes    Types: Benzodiazepines, Morphine    Comment: prescribed tramadol, xanax, ambien  and morphine   Sexual activity: Yes    Partners: Male  Other Topics Concern   Not on file  Social History Narrative   Marital status/children/pets: Married. One child.   Education/employment: Works as a Hospital doctor:      -Wears a bicycle helmet riding a bike: Yes     -smoke alarm in the home:Yes     - wears seatbelt: Yes     - Feels safe in their relationships: Yes   Social Determinants of Health   Financial Resource Strain: Low Risk  (04/26/2022)   Overall Financial Resource Strain (CARDIA)    Difficulty of Paying Living Expenses: Not hard at all  Food Insecurity: No Food Insecurity (04/26/2022)   Hunger Vital Sign    Worried About Running Out of Food in the Last Year: Never true    [place] in the Last Year: Never true  Transportation Needs: No Transportation Needs (04/26/2022)   PRAPARE - Hydrologist (Medical): No    Lack of Transportation (Non-Medical): No  Physical Activity: Sufficiently Active (04/26/2022)   Exercise Vital Sign    Days of Exercise per Week: 4 days    Minutes of Exercise per Session: 60 min  Stress: No Stress Concern Present (04/26/2022)   [place]    Feeling of Stress : Not at all  Social Connections: Moderately Isolated (04/26/2022)   Social Connection and Isolation Panel [NHANES]    Frequency of Communication with Friends and Family: More than three times a week    Frequency of Social Gatherings with Friends and Family: Twice a week    Attends Religious Services: Never    Marine scientist or Organizations: No    Attends Music therapist: Never    Marital Status: Married     Family History: The patient's family history includes Arthritis in her mother; Brain cancer in  her sister; Colon cancer in her father; Early death (age of onset: 41) in her mother; Early death (age of onset: 35) in her brother; Early death (age of onset: 82) in her sister; Heart attack in her brother and mother; Heart disease in her brother, father, mother, and another family member; Hyperlipidemia in her father; Hypertension in her father and paternal grandfather; Kidney disease in her paternal grandmother; Liver cancer in her brother. There is no history of Esophageal cancer, Pancreatic cancer, or Stomach cancer.  ROS:   Review of Systems  Constitutional:  Negative for chills and malaise/fatigue.  HENT:  Negative for ear pain.   Eyes:  Negative for blurred vision and redness.  Respiratory:  Negative for cough and shortness of breath.   Cardiovascular:  Positive for chest pain (Exertional pressure sensation). Negative for palpitations, orthopnea, claudication, leg swelling and PND.  Gastrointestinal:  Negative for abdominal pain, blood in stool, constipation, heartburn, nausea and vomiting.  Genitourinary:  Negative for dysuria.  Musculoskeletal:  Negative for back pain and joint pain.  Neurological:  Negative for dizziness, tingling, tremors and headaches.  Endo/Heme/Allergies:  Negative for polydipsia. Does not bruise/bleed easily.  Psychiatric/Behavioral:  Negative for depression. The patient does not have insomnia.      EKGs/Labs/Other Studies Reviewed:    The following studies were reviewed today:  ECHO 07/12/2020:  IMPRESSIONS  1. Left ventricular ejection fraction, by estimation, is 55 to 60%. The  left ventricle has normal function. The left ventricle has no regional  wall motion abnormalities. There is mild left ventricular hypertrophy.  Left ventricular diastolic parameters  were normal.   2. In the apical 4 chamber view, the RVEF appears mildly down, but the S'  and TAPSE are normal. IN other windows, function appears normal. The right  ventricular size is normal.  There is mildly elevated pulmonary artery  systolic pressure.   3. Right atrial size was mildly dilated.   4. The mitral valve is normal in structure. Mild mitral valve  regurgitation.   5. TR jet is directed anterior into RA . Tricuspid valve regurgitation is  mild to moderate.   6. The aortic valve is abnormal. Aortic valve regurgitation is not  visualized. Mild aortic valve sclerosis is present, with no evidence of  aortic valve stenosis.   7. The inferior vena cava is normal in size with greater than 50%  respiratory variability, suggesting right atrial pressure of 3 mmHg.   TTE 07/12/20: IMPRESSIONS   1. Left ventricular ejection fraction, by estimation, is 55 to 60%. The  left ventricle has normal function. The left ventricle has no regional  wall motion abnormalities. There is mild left ventricular hypertrophy.  Left ventricular diastolic parameters  were normal.   2. In the apical 4 chamber view, the RVEF appears mildly down, but the S'  and TAPSE are normal. IN other windows, function appears normal. The right  ventricular size is normal. There is mildly elevated pulmonary artery  systolic pressure.   3. Right atrial size was mildly dilated.   4. The mitral valve is normal in structure. Mild mitral valve  regurgitation.   5. TR jet is directed anterior into RA . Tricuspid valve regurgitation is  mild to moderate.   6. The aortic valve is abnormal. Aortic valve regurgitation is not  visualized. Mild aortic valve sclerosis is present, with no evidence of  aortic valve stenosis.   7. The inferior vena cava is normal in size with greater than 50%  respiratory variability, suggesting right atrial pressure of 3 mmHg.   Cardiac monitor 06/2020: Patient had a min HR of 61 bpm, max HR of 143 bpm, and avg HR of 80 BPM. 2 Supraventricular Tachycardia runs occurred, the longest run lasting 12 beats. Rare PACs and PVC.   Predominant underlying rhythm was Sinus Rhythm. 2 very short  runs of Supraventricular Tachycardia occurred. The patient didn't record any symptoms.  Coronary Calcium Score 2021: 503 EXAM: Coronary Calcium Score   TECHNIQUE: The patient was scanned on a [employer]. Axial non-contrast 3 mm slices were carried out through the  heart. The data set was analyzed on a dedicated work station and scored using the [place].   FINDINGS: Non-cardiac: See separate report from [facility].   Ascending Aorta: Normal size, no calcifications.   Pericardium: Normal.   Coronary arteries: Normal origin.  EKG:  EKG is personally reviewed. 11/01/2022:  Sinus rhythm. Rate 92 bpm. 04/28/2021: NSR, HR 92 bpm  Recent Labs: 12/28/2021: ALT 14; BUN 10; Creat 0.75; Hemoglobin 12.4; Platelets 223; Potassium 4.4; Sodium 139 04/28/2022: TSH 1.28   Recent Lipid Panel    Component Value Date/Time   CHOL 118 04/20/2021 1643   TRIG 46 04/20/2021 1643   TRIG 72 05/15/2013 1748   HDL 65 04/20/2021 1643   HDL 43 05/15/2013 1748   CHOLHDL 1.8 04/20/2021 1643   LDLCALC 42 04/20/2021 1643   LDLCALC 88 05/15/2013 1748     Risk Assessment/Calculations:          Physical Exam:    VS:  BP 120/84   Pulse 92   Ht '5\' 5"'$  (1.651 m)   Wt 116 lb 3.2 oz (52.7 kg)   SpO2 98%   BMI 19.34 kg/m     Wt Readings from Last 3 Encounters:  11/01/22 116 lb 3.2 oz (52.7 kg)  06/12/22 114 lb 12.8 oz (52.1 kg)  12/28/21 113 lb (51.3 kg)    GEN:  Well nourished, well developed in no acute distress HEENT: Normal NECK: No JVD; No carotid bruits CARDIAC: RRR, no murmurs, rubs, gallops RESPIRATORY:  Clear to auscultation without rales, wheezing or rhonchi  ABDOMEN: Soft, non-tender, non-distended MUSCULOSKELETAL:  No edema; No deformity  SKIN: Warm and dry NEUROLOGIC:  Alert and oriented x 3 PSYCHIATRIC:  Normal affect   ASSESSMENT:    1. Mixed hyperlipidemia   2. Family history of early CAD   3. Statin intolerance   4. Coronary artery disease  involving native coronary artery of native heart without angina pectoris   5. Raynaud's disease without gangrene     PLAN:    In order of problems listed above:  #Coronary artery disease without angina: #Coronary Artery Calcification: Calcium score 503 (97% for age, gender matched controls). Remains very active. States she has occasional chest pressure with extreme exertion. This is rare. She declined further testing at this time but will let us know if it increases in frequency. -Changing to repatha due to insurance -Continue lifestyle modifications -Will let us know if she is having symptoms with exertion and can pursue stress testing at that time  #HLD: -Changing to repatha due to insurance  Follow-up:  6 months.  Medication Adjustments/Labs and Tests Ordered: Current medicines are reviewed at length with the patient today.  Concerns regarding medicines are outlined above.   Orders Placed This Encounter  Procedures   Lipid Profile   EKG 12-Lead   Meds ordered this encounter  Medications   DISCONTD: nitroGLYCERIN (NITROGLYN) 2 % ointment 0.5 inch   nitroGLYCERIN (NITRO-BID) 2 % ointment    Sig: Apply 0.5 inches topically 2 (two) times daily as needed for chest pain (raynauds).    Dispense:  30 g    Refill:  4   Patient Instructions  Medication Instructions:   Your physician recommends that you continue on your current medications as directed. Please refer to the Current Medication list given to you today.  *If you need a refill on your cardiac medications before your next appointment, please call your pharmacy*   Lab Work:  THIS WEEK OR NEXT--CHECK LIPIDS--PLEASE COME  FASTING TO THIS LAB APPOINTMENT  If you have labs (blood work) drawn today and your tests are completely normal, you will receive your results only by: [place] (if you have MyChart) OR A paper copy in the mail If you have any lab test that is abnormal or we need to change your treatment, we  will call you to review the results.   Follow-Up: At [facility], you and your health needs are our priority.  As part of our continuing mission to provide you with exceptional heart care, we have created designated Provider Care Teams.  These Care Teams include your primary Cardiologist (physician) and Advanced Practice Providers (APPs -  Physician Assistants and Nurse Practitioners) who all work together to provide you with the care you need, when you need it.  We recommend signing up for the patient portal called "MyChart".  Sign up information is provided on this After Visit Summary.  MyChart is used to connect with patients for Virtual Visits (Telemedicine).  Patients are able to view lab/test results, encounter notes, upcoming appointments, etc.  Non-urgent messages can be sent to your provider as well.   To learn more about what you can do with MyChart, go to [email or web address].    Your next appointment:   6 month(s)  Provider:   DR. Delmar Landau Stumpf,acting as a scribe for Freada Bergeron, MD.,have documented all relevant documentation on the behalf of Freada Bergeron, MD,as directed by  Freada Bergeron, MD while in the presence of Freada Bergeron, MD.  I, Freada Bergeron, MD, have reviewed all documentation for this visit. The documentation on 11/01/22 for the exam, diagnosis, procedures, and orders are all accurate and complete.   Signed, Freada Bergeron, MD  11/01/2022 3:11 PM    [place]

## 2022-11-02 DIAGNOSIS — M797 Fibromyalgia: Secondary | ICD-10-CM | POA: Diagnosis not present

## 2022-11-02 DIAGNOSIS — G894 Chronic pain syndrome: Secondary | ICD-10-CM | POA: Diagnosis not present

## 2022-11-02 DIAGNOSIS — M47812 Spondylosis without myelopathy or radiculopathy, cervical region: Secondary | ICD-10-CM | POA: Diagnosis not present

## 2022-11-02 DIAGNOSIS — Z79891 Long term (current) use of opiate analgesic: Secondary | ICD-10-CM | POA: Diagnosis not present

## 2022-11-10 ENCOUNTER — Ambulatory Visit: Payer: Medicare Other | Attending: Cardiology

## 2022-11-10 DIAGNOSIS — Z789 Other specified health status: Secondary | ICD-10-CM

## 2022-11-10 DIAGNOSIS — E782 Mixed hyperlipidemia: Secondary | ICD-10-CM | POA: Diagnosis not present

## 2022-11-10 DIAGNOSIS — I2510 Atherosclerotic heart disease of native coronary artery without angina pectoris: Secondary | ICD-10-CM | POA: Diagnosis not present

## 2022-11-10 DIAGNOSIS — I7300 Raynaud's syndrome without gangrene: Secondary | ICD-10-CM

## 2022-11-10 DIAGNOSIS — Z8249 Family history of ischemic heart disease and other diseases of the circulatory system: Secondary | ICD-10-CM

## 2022-11-11 LAB — LIPID PANEL
Chol/HDL Ratio: 2.2 ratio (ref 0.0–4.4)
Cholesterol, Total: 133 mg/dL (ref 100–199)
HDL: 61 mg/dL (ref >39)
LDL Chol Calc (NIH): 61 mg/dL (ref 0–99)
Triglycerides: 50 mg/dL (ref 0–149)
VLDL Cholesterol Cal: 11 mg/dL (ref 5–40)

## 2022-11-20 ENCOUNTER — Telehealth (INDEPENDENT_AMBULATORY_CARE_PROVIDER_SITE_OTHER): Payer: Medicare Other | Admitting: Family Medicine

## 2022-11-20 ENCOUNTER — Encounter: Payer: Self-pay | Admitting: Family Medicine

## 2022-11-20 DIAGNOSIS — L03011 Cellulitis of right finger: Secondary | ICD-10-CM

## 2022-11-20 MED ORDER — LEVOFLOXACIN 500 MG PO TABS: 500.0000 mg | ORAL_TABLET | Freq: Every day | ORAL | 0 refills | Status: AC

## 2022-11-20 MED ORDER — CLINDAMYCIN PHOSPHATE 1 % EX SOLN
Freq: Two times a day (BID) | CUTANEOUS | 0 refills | Status: DC
Start: 2022-11-20 — End: 2022-11-20

## 2022-11-20 MED ORDER — CIPROFLOXACIN HCL 0.3 % OP SOLN: OPHTHALMIC | 0 refills | Status: AC

## 2022-11-20 NOTE — Progress Notes (Addendum)
VIRTUAL VISIT VIA VIDEO  I connected with Wanda Robertson on 11/20/22 at 10:00 AM EST by a video enabled telemedicine application and verified that I am speaking with the correct person using two identifiers. Location patient: Home Location provider: [facility], Office Persons participating in the virtual visit: Patient, Dr. Raoul Pitch and Cyndra Numbers, CMA  I discussed the limitations of evaluation and management by telemedicine and the availability of in person appointments. The patient expressed understanding and agreed to proceed.   Wanda Robertson , [birth year], 67 y.o., female MRN: [id number] Patient Care Team    Relationship Specialty Notifications Start End  Ma Hillock, DO PCP - General Family Medicine  07/16/20   Dorothy Spark, MD PCP - Cardiology Cardiology Admissions 10/15/18   Mauri Pole, MD Consulting Physician Gastroenterology  07/21/20   Christain Sacramento, [place] Referring Physician Optometry  07/21/20   Linda Hedges, [place] Physician Obstetrics and Gynecology  07/21/20   Nicholaus Bloom, MD  Anesthesiology  07/21/20    Comment: pain management    Chief Complaint  Patient presents with   Nail Problem    Right thumb. Noticed last Friday. Discolored/stained after removing polish     Subjective: Pt presents for an OV with complaints of discoloration of nail after nail GEL removed. She noticed discoloration Friday on her right thumb nail. She reports green discoloration the majority of her nail, sparing proximal fold. No erythema or pain.      06/12/2022    2:28 PM 04/26/2022   10:12 AM 12/28/2021    1:48 PM 06/15/2021    1:53 PM 01/21/2020    1:06 PM  Depression screen PHQ 2/9  Decreased Interest 0 0 0 0 0  Down, Depressed, Hopeless 0 0 0 0 0  PHQ - 2 Score 0 0 0 0 0  Altered sleeping '1  1 1 '$ 0  Tired, decreased energy '1  1 1 '$ 0  Change in appetite 0  0 0 0  Feeling bad or failure about yourself  0  0 0 0  Trouble concentrating 0  0 0 0   Moving slowly or fidgety/restless 0  0 0 0  Suicidal thoughts 0  0 0 0  PHQ-9 Score '2  2 2 '$ 0    Allergies  Allergen Reactions   Compazine [Prochlorperazine Edisylate] Other (See Comments)    Severe muscle requiring ER visit   Nsaids Nausea And Vomiting and Nausea Only   Penicillins Hives   Prochlorperazine    Sulfamethoxazole-Trimethoprim    Sulfa Antibiotics Rash   Social History   Social History Narrative   Marital status/children/pets: Married. One child.   Education/employment: Works as a Hospital doctor:      -Wears a bicycle helmet riding a bike: Yes     -smoke alarm in the home:Yes     - wears seatbelt: Yes     - Feels safe in their relationships: Yes   Past Medical History:  Diagnosis Date   Abdominal hernia 04/01/2020   Abnormal cervical Papanicolaou smear 04/01/2020   Chicken pox    Chronic back pain    Chronic neck pain    Family history of heart disease 02/25/2014   Fibromyalgia 05/15/2013   GERD (gastroesophageal reflux disease) 05/15/2013   Heart murmur    Hyperlipemia 05/15/2013   Hypothyroidism 05/15/2013   IBS (irritable bowel syndrome)    Osteopenia    Osteoporosis 05/15/2013   History of past  treatment with Forteo    Raynaud's disease 02/25/2014   Past Surgical History:  Procedure Laterality Date   COLONOSCOPY     TONSILLECTOMY     Family History  Problem Relation Age of Onset   Heart disease Mother    Arthritis Mother    Early death Mother 58   Heart attack Mother    Heart disease Father    Colon cancer Father    Hyperlipidemia Father    Hypertension Father    Heart disease Other        family history    Brain cancer Sister    Early death Sister 46   Liver cancer Brother    Early death Brother 46   Kidney disease Paternal Grandmother    Hypertension Paternal Grandfather    Heart disease Brother    Heart attack Brother    Esophageal cancer Neg Hx    Pancreatic cancer Neg Hx    Stomach cancer Neg Hx    Allergies as of 11/20/2022        Reactions   Compazine [prochlorperazine Edisylate] Other (See Comments)   Severe muscle requiring ER visit   Nsaids Nausea And Vomiting, Nausea Only   Penicillins Hives   Prochlorperazine    Sulfamethoxazole-trimethoprim    Sulfa Antibiotics Rash        Medication List        Accurate as of November 20, 2022 10:19 AM. If you have any questions, ask your nurse or doctor.          ALPRAZolam 0.5 MG tablet Commonly known as: XANAX Take 1 tablet (0.5 mg total) by mouth at bedtime as needed.   Biotin 1000 MCG tablet Take 1,000 mcg by mouth daily.   ciprofloxacin 0.3 % ophthalmic solution Commonly known as: CILOXAN 2 drops BID to infected nail. Started by: Howard Pouch, DO   CO Q 10 PO Take 1 tablet by mouth daily.   estradiol 0.075 MG/24HR Commonly known as: VIVELLE-DOT Lyllana 0.075 mg/24 hr transdermal patch   KRILL OIL PO Take 1 tablet by mouth daily.   levofloxacin 500 MG tablet Commonly known as: Levaquin Take 1 tablet (500 mg total) by mouth daily for 5 days. Started by: Howard Pouch, DO   levothyroxine 25 MCG tablet Commonly known as: SYNTHROID Take 1 tablet (25 mcg total) by mouth daily.   morphine 30 MG 24 hr capsule Commonly known as: KADIAN Take 30 mg by mouth in the morning, at noon, and at bedtime.   MS Contin 30 MG 12 hr tablet Generic drug: morphine Take 30 mg by mouth every 8 (eight) hours.   Nitro-Bid 2 % ointment Generic drug: nitroGLYCERIN Apply 0.5 inches topically 2 (two) times daily as needed for chest pain (raynauds).   Repatha SureClick 789 MG/ML Soaj Generic drug: Evolocumab Inject 140 mg into the skin every 14 (fourteen) days.   traMADol 50 MG tablet Commonly known as: ULTRAM 1 tablet as needed Orally Once a day   valACYclovir 500 MG tablet Commonly known as: VALTREX Take 500 mg by mouth daily.   VITAMIN B 12 PO Take 1 tablet by mouth daily.   zolpidem 12.5 MG CR tablet Commonly known as: AMBIEN CR Take 1  tablet (12.5 mg total) by mouth at bedtime as needed for sleep.        All past medical history, surgical history, allergies, family history, immunizations andmedications were updated in the EMR today and reviewed under the history and medication portions of their EMR.  ROS Negative, with the exception of above mentioned in HPI   Objective:  There were no vitals taken for this visit. There is no height or weight on file to calculate BMI.  Physical Exam Vitals and nursing note reviewed.  Constitutional:      General: She is not in acute distress.    Appearance: Normal appearance. She is not toxic-appearing.  HENT:     Head: Normocephalic and atraumatic.  Eyes:     General: No scleral icterus.       Right eye: No discharge.        Left eye: No discharge.     Conjunctiva/sclera: Conjunctivae normal.  Pulmonary:     Effort: Pulmonary effort is normal.  Musculoskeletal:     Cervical back: Normal range of motion.  Skin:    Findings: No rash.     Comments: Green discoloration distal 2/3 of right thumb nail.   Neurological:     Mental Status: She is alert and oriented to person, place, and time. Mental status is at baseline.  Psychiatric:        Mood and Affect: Mood normal.        Behavior: Behavior normal.        Thought Content: Thought content normal.        Judgment: Judgment normal.      No results found. No results found. No results found for this or any previous visit (from the past 24 hour(s)).  Assessment/Plan: Wanda Robertson is a 67 y.o. female present for OV for  Infection of nail bed of finger of right hand - lq x5 d with cipro topical bid x14-30d - she has an appt scheduled end of this month for [facility] in which we can check nail bed at that time.  Consider cipro drops extended tx if needed on follow up  Reviewed expectations re: course of current medical issues. Discussed self-management of symptoms. Outlined signs and symptoms indicating need for  more acute intervention. Patient verbalized understanding and all questions were answered. Patient received an After-Visit Summary.    No orders of the defined types were placed in this encounter.  Meds ordered this encounter  Medications   DISCONTD: clindamycin (CLEOCIN T) 1 % external solution    Sig: Apply topically 2 (two) times daily for 14 days.    Dispense:  30 mL    Refill:  0   levofloxacin (LEVAQUIN) 500 MG tablet    Sig: Take 1 tablet (500 mg total) by mouth daily for 5 days.    Dispense:  5 tablet    Refill:  0   ciprofloxacin (CILOXAN) 0.3 % ophthalmic solution    Sig: 2 drops BID to infected nail.    Dispense:  5 mL    Refill:  0    Dc clindy and fill this script please   Referral Orders  No referral(s) requested today     Note is dictated utilizing voice recognition software. Although note has been proof read prior to signing, occasional typographical errors still can be missed. If any questions arise, please do not hesitate to call for verification.   electronically signed by:  Howard Pouch, DO  [place]

## 2022-11-20 NOTE — Addendum Note (Signed)
Addended by: Howard Pouch A on: 11/20/2022 10:19 AM   Modules accepted: Orders

## 2022-11-29 DIAGNOSIS — H524 Presbyopia: Secondary | ICD-10-CM | POA: Diagnosis not present

## 2022-12-11 ENCOUNTER — Ambulatory Visit: Payer: BLUE CROSS/BLUE SHIELD | Admitting: Family Medicine

## 2022-12-12 ENCOUNTER — Other Ambulatory Visit: Payer: Self-pay | Admitting: Pharmacist

## 2022-12-12 MED ORDER — REPATHA SURECLICK 140 MG/ML ~~LOC~~ SOAJ: 1.0000 | SUBCUTANEOUS | 3 refills | Status: DC

## 2022-12-27 ENCOUNTER — Ambulatory Visit: Payer: Medicare Other | Admitting: Family Medicine

## 2022-12-28 DIAGNOSIS — M47812 Spondylosis without myelopathy or radiculopathy, cervical region: Secondary | ICD-10-CM | POA: Diagnosis not present

## 2022-12-28 DIAGNOSIS — M797 Fibromyalgia: Secondary | ICD-10-CM | POA: Diagnosis not present

## 2022-12-28 DIAGNOSIS — Z79891 Long term (current) use of opiate analgesic: Secondary | ICD-10-CM | POA: Diagnosis not present

## 2022-12-28 DIAGNOSIS — G894 Chronic pain syndrome: Secondary | ICD-10-CM | POA: Diagnosis not present

## 2023-02-07 ENCOUNTER — Ambulatory Visit (INDEPENDENT_AMBULATORY_CARE_PROVIDER_SITE_OTHER): Payer: Medicare Other | Admitting: Family Medicine

## 2023-02-07 ENCOUNTER — Encounter: Payer: Self-pay | Admitting: Family Medicine

## 2023-02-07 VITALS — BP 119/84 | HR 100 | Temp 98.5°F | Wt 117.6 lb

## 2023-02-07 DIAGNOSIS — E038 Other specified hypothyroidism: Secondary | ICD-10-CM | POA: Diagnosis not present

## 2023-02-07 DIAGNOSIS — E063 Autoimmune thyroiditis: Secondary | ICD-10-CM | POA: Diagnosis not present

## 2023-02-07 DIAGNOSIS — F5101 Primary insomnia: Secondary | ICD-10-CM

## 2023-02-07 MED ORDER — ALPRAZOLAM 0.5 MG PO TABS: 0.5000 mg | ORAL_TABLET | Freq: Every evening | ORAL | 1 refills | Status: DC | PRN

## 2023-02-07 MED ORDER — LEVOTHYROXINE SODIUM 25 MCG PO TABS
25.0000 ug | ORAL_TABLET | Freq: Every day | ORAL | 1 refills | Status: DC
Start: 2023-02-07 — End: 2023-09-26

## 2023-02-07 MED ORDER — ZOLPIDEM TARTRATE ER 12.5 MG PO TBCR: 12.5000 mg | EXTENDED_RELEASE_TABLET | Freq: Every evening | ORAL | 1 refills | Status: DC | PRN

## 2023-02-07 NOTE — Progress Notes (Signed)
Patient ID: Wanda Robertson, female  DOB: 05-Oct-1956, 67 y.o.   MRN: [id number] Patient Care Team    Relationship Specialty Notifications Start End  Natalia Leatherwood, DO PCP - General Family Medicine  07/16/20   Lars Masson, MD PCP - Cardiology Cardiology Admissions 10/15/18   Napoleon Form, MD Consulting Physician Gastroenterology  07/21/20   Tobias Alexander, OD Referring Physician Optometry  07/21/20   Mitchel Honour, DO Consulting Physician Obstetrics and Gynecology  07/21/20   Thyra Breed, MD  Anesthesiology  07/21/20    Comment: pain management    Chief Complaint  Patient presents with   Insomnia     Subjective: Wanda Robertson is a 67 y.o.  female present for [facility] Primary insomnia Patient reports the Ambien 12.5 mg nightly is working well for her insomnia.  Occasionally she will use the Xanax 0.5 mg nightly if she is unable to fall asleep.  She denies complications or oversedation Prior note: Patient reports she has been prescribed Ambien 12.5 mg nightly as needed for insomnia for many years. She also is prescribed Xanax 0.5 mg nightly as needed for insomnia. She states she does not take both of these at the same time typically. Depending upon her sleep pattern. She is prescribed tramadol and morphine through her pain specialist. She feels she is doing much better. She states they are aware of her Ambien and benzodiazepine use. She denies any sedation with these medications.  Chronic neck pain/Chronic back pain, unspecified back location, unspecified back pain laterality/Cervicalgia Patient is prescribed morphine and tramadol through her pain management specialist.     06/12/2022    2:28 PM 04/26/2022   10:12 AM 12/28/2021    1:48 PM 06/15/2021    1:53 PM 01/21/2020    1:06 PM  Depression screen PHQ 2/9  Decreased Interest 0 0 0 0 0  Down, Depressed, Hopeless 0 0 0 0 0  PHQ - 2 Score 0 0 0 0 0  Altered sleeping 0  Tired, decreased energy 0   Change in appetite 0  0 0 0  Feeling bad or failure about yourself  0  0 0 0  Trouble concentrating 0  0 0 0  Moving slowly or fidgety/restless 0  0 0 0  Suicidal thoughts 0  0 0 0  PHQ-9 Score 0      06/12/2022    2:28 PM 12/28/2021    1:48 PM 06/15/2021    1:53 PM  GAD 7 : Generalized Anxiety Score  Nervous, Anxious, on Edge 0 0 1  Control/stop worrying 0 0 0  Worry too much - different things 0 0 0  Trouble relaxing 1 1 0  Restless 0 0 0  Easily annoyed or irritable 0 0 0  Afraid - awful might happen 0 0 0  Total GAD 7 Score 02/06/2023    4:04 PM 04/26/2022   10:15 AM 03/27/2022    2:32 PM 12/25/2021    9:54 PM 07/22/2018    3:03 PM  Fall Risk   Falls in the past year? 0 0 0 0 No  Number falls in past yr:  0 0    Injury with Fall?  0     Risk for fall due to :  Impaired vision     Follow up  Falls prevention discussed  Immunization History  Administered Date(s) Administered   Janssen (J&J) SARS-COV-2 Vaccination 01/16/2020   Tdap 10/16/2013   Zoster Recombinat (Shingrix) 10/05/2019, 07/16/2020   Zoster, Live 10/05/2019    No results found.  Past Medical History:  Diagnosis Date   Abdominal hernia 04/01/2020   Abnormal cervical Papanicolaou smear 04/01/2020   Chicken pox    Chronic back pain    Chronic neck pain    Family history of heart disease 02/25/2014   Fibromyalgia 05/15/2013   GERD (gastroesophageal reflux disease) 05/15/2013   Heart murmur    Hyperlipemia 05/15/2013   Hypothyroidism 05/15/2013   IBS (irritable bowel syndrome)    Osteopenia    Osteoporosis 05/15/2013   History of past treatment with Forteo    Raynaud's disease 02/25/2014   Allergies  Allergen Reactions   Compazine [Prochlorperazine Edisylate] Other (See Comments)    Severe muscle requiring ER visit   Nsaids Nausea And Vomiting and Nausea Only   Penicillins Hives   Prochlorperazine    Sulfamethoxazole-Trimethoprim    Sulfa Antibiotics Rash   Past  Surgical History:  Procedure Laterality Date   COLONOSCOPY     TONSILLECTOMY     Family History  Problem Relation Age of Onset   Heart disease Mother    Arthritis Mother    Early death Mother 47   Heart attack Mother    Heart disease Father    Colon cancer Father    Hyperlipidemia Father    Hypertension Father    Heart disease Other        family history    Brain cancer Sister    Early death Sister 25   Liver cancer Brother    Early death Brother 50   Kidney disease Paternal Grandmother    Hypertension Paternal Grandfather    Heart disease Brother    Heart attack Brother    Esophageal cancer Neg Hx    Pancreatic cancer Neg Hx    Stomach cancer Neg Hx    Social History   Social History Narrative   Marital status/children/pets: Married. One child.   Education/employment: Works as a Careers adviser:      -Wears a bicycle helmet riding a bike: Yes     -smoke alarm in the home:Yes     - wears seatbelt: Yes     - Feels safe in their relationships: Yes    Allergies as of 02/07/2023       Reactions   Compazine [prochlorperazine Edisylate] Other (See Comments)   Severe muscle requiring ER visit   Nsaids Nausea And Vomiting, Nausea Only   Penicillins Hives   Prochlorperazine    Sulfamethoxazole-trimethoprim    Sulfa Antibiotics Rash        Medication List        Accurate as of February 07, 2023  1:45 PM. If you have any questions, ask your nurse or doctor.          ALPRAZolam 0.5 MG tablet Commonly known as: XANAX Take 1 tablet (0.5 mg total) by mouth at bedtime as needed.   Biotin 1000 MCG tablet Take 1,000 mcg by mouth daily.   CO Q 10 PO Take 1 tablet by mouth daily.   estradiol 0.075 MG/24HR Commonly known as: VIVELLE-DOT Lyllana 0.075 mg/24 hr transdermal patch   KRILL OIL PO Take 1 tablet by mouth daily.   levothyroxine 25 MCG tablet Commonly known as: SYNTHROID Take 1 tablet (25 mcg total) by mouth daily.   MS Contin 30  MG 12 hr  tablet Generic drug: morphine Take 30 mg by mouth every 8 (eight) hours. What changed: Another medication with the same name was removed. Continue taking this medication, and follow the directions you see here. Changed by: Felix Pacini, DO   Nitro-Bid 2 % ointment Generic drug: nitroGLYCERIN Apply 0.5 inches topically 2 (two) times daily as needed for chest pain (raynauds).   Repatha SureClick 140 MG/ML Soaj Generic drug: Evolocumab Inject 140 mg into the skin every 14 (fourteen) days.   traMADol 50 MG tablet Commonly known as: ULTRAM 1 tablet as needed Orally Once a day   valACYclovir 500 MG tablet Commonly known as: VALTREX Take 500 mg by mouth daily.   VITAMIN B 12 PO Take 1 tablet by mouth daily.   zolpidem 12.5 MG CR tablet Commonly known as: AMBIEN CR Take 1 tablet (12.5 mg total) by mouth at bedtime as needed for sleep.        All past medical history, surgical history, allergies, family history, immunizations andmedications were updated in the EMR today and reviewed under the history and medication portions of their EMR.      ROS: 14 pt review of systems performed and negative (unless mentioned in an HPI)  Objective: BP 119/84   Pulse 100   Temp 98.5 F (36.9 C)   Wt 117 lb 9.6 oz (53.3 kg)   SpO2 98%   BMI 19.57 kg/m  Physical Exam Vitals and nursing note reviewed.  Constitutional:      General: She is not in acute distress.    Appearance: Normal appearance. She is normal weight. She is not ill-appearing or toxic-appearing.  HENT:     Head: Normocephalic and atraumatic.  Eyes:     Extraocular Movements: Extraocular movements intact.     Conjunctiva/sclera: Conjunctivae normal.     Pupils: Pupils are equal, round, and reactive to light.  Cardiovascular:     Rate and Rhythm: Normal rate and regular rhythm.     Heart sounds: No murmur heard. Pulmonary:     Effort: Pulmonary effort is normal. No respiratory distress.     Breath sounds: Normal breath  sounds. No wheezing, rhonchi or rales.  Musculoskeletal:     Right lower leg: No edema.     Left lower leg: No edema.  Neurological:     Mental Status: She is alert and oriented to person, place, and time. Mental status is at baseline.  Psychiatric:        Mood and Affect: Mood normal.        Behavior: Behavior normal.        Thought Content: Thought content normal.        Judgment: Judgment normal.    Assessment/plan: Wanda Robertson is a 67 y.o. female present for  Primary insomnia stable -  She is on multiple sedating medications. She denies any sedation and reports her pain management team are aware of her Ambien and her benzodiazepine use. She reports she typically does not use the benzodiazepine and the Ambien at the same time. Continue Xanax 0.5 mg nightly as needed Continue  zolpidem (AMBIEN CR) 12.5 MG CR tablet [place] controlled substance database was reviewed 02/07/23   Hypothyroidism/fatigue: Continue levo 25 mcg qd.  Labs due next visit- if still experiencing fatigue will rpt labs sooner  Chronic neck pain/Chronic back pain, unspecified back location, unspecified back pain laterality/Cervicalgia Patient is prescribed tramadol and Kadian through her pain management team.  Mixed hyperlipidemia Repatha by cardio  Raynaud's disease without gangrene Managed by rheumatology  Return in about 24 weeks (around 07/25/2023).  No orders of the defined types were placed in this encounter.   Meds ordered this encounter  Medications   ALPRAZolam (XANAX) 0.5 MG tablet    Sig: Take 1 tablet (0.5 mg total) by mouth at bedtime as needed.    Dispense:  90 tablet    Refill:  1   zolpidem (AMBIEN CR) 12.5 MG CR tablet    Sig: Take 1 tablet (12.5 mg total) by mouth at bedtime as needed for sleep.    Dispense:  90 tablet    Refill:  1   levothyroxine (SYNTHROID) 25 MCG tablet    Sig: Take 1 tablet (25 mcg total) by mouth daily.    Dispense:  90 tablet    Refill:   1    Referral Orders  No referral(s) requested today     Note is dictated utilizing voice recognition software. Although note has been proof read prior to signing, occasional typographical errors still can be missed. If any questions arise, please do not hesitate to call for verification.  Electronically signed by: Felix Pacini, DO [facility] [place]

## 2023-02-07 NOTE — Patient Instructions (Addendum)
Return in about 24 weeks (around 07/25/2023).        Great to see you today.  I have refilled the medication(s) we provide.   If labs were collected, we will inform you of lab results once received either by echart message or telephone call.   - echart message- for normal results that have been seen by the patient already.   - telephone call: abnormal results or if patient has not viewed results in their echart.

## 2023-02-21 ENCOUNTER — Telehealth: Payer: Self-pay

## 2023-02-21 DIAGNOSIS — K080 Exfoliation of teeth due to systemic causes: Secondary | ICD-10-CM | POA: Diagnosis not present

## 2023-02-21 NOTE — Telephone Encounter (Signed)
Wanda Robertson (Key: [id number]) Rx #: [id number] Created: April 24th 2024

## 2023-02-28 DIAGNOSIS — M47812 Spondylosis without myelopathy or radiculopathy, cervical region: Secondary | ICD-10-CM | POA: Diagnosis not present

## 2023-02-28 DIAGNOSIS — G894 Chronic pain syndrome: Secondary | ICD-10-CM | POA: Diagnosis not present

## 2023-02-28 DIAGNOSIS — M797 Fibromyalgia: Secondary | ICD-10-CM | POA: Diagnosis not present

## 2023-02-28 DIAGNOSIS — Z79891 Long term (current) use of opiate analgesic: Secondary | ICD-10-CM | POA: Diagnosis not present

## 2023-03-22 ENCOUNTER — Encounter: Payer: Self-pay | Admitting: Family Medicine

## 2023-03-22 ENCOUNTER — Ambulatory Visit (INDEPENDENT_AMBULATORY_CARE_PROVIDER_SITE_OTHER): Payer: Medicare Other | Admitting: Family Medicine

## 2023-03-22 VITALS — BP 118/81 | HR 83 | Temp 98.0°F | Wt 119.4 lb

## 2023-03-22 DIAGNOSIS — R1909 Other intra-abdominal and pelvic swelling, mass and lump: Secondary | ICD-10-CM | POA: Diagnosis not present

## 2023-03-22 NOTE — Progress Notes (Signed)
Wanda Robertson , [birth year], 67 y.o., female MRN: [id number] Patient Care Team    Relationship Specialty Notifications Start End  Natalia Leatherwood, DO PCP - General Family Medicine  07/16/20   Lars Masson, MD PCP - Cardiology Cardiology Admissions 10/15/18   Napoleon Form, MD Consulting Physician Gastroenterology  07/21/20   Tobias Alexander, OD Referring Physician Optometry  07/21/20   Mitchel Honour, DO Consulting Physician Obstetrics and Gynecology  07/21/20   Thyra Breed, MD  Anesthesiology  07/21/20    Comment: pain management    Chief Complaint  Patient presents with   groin mass    C/o lump on left side of groin; noticed a few days ago; denies any pain     Subjective: Wanda Robertson is a 67 y.o. Pt presents for an OV with complaints of left-sided groin mass of a few days to a couple weeks duration.  Associated symptoms include nothing.  Patient reports she incidentally noted this mass when taking a shower.  She denies any skin changes, recent infections, abdominal pain, fever or chills.  She denies any bowel habit changes or urinary changes.  Patient does workout daily, but typically does not lift anything heavy or use weights.     03/22/2023    9:38 AM 02/07/2023    3:44 PM 06/12/2022    2:28 PM 04/26/2022   10:12 AM 12/28/2021    1:48 PM  Depression screen PHQ 2/9  Decreased Interest 0 0 0 0 0  Down, Depressed, Hopeless 0 0 0 0 0  PHQ - 2 Score 0 0 0 0 0  Altered sleeping   1  1  Tired, decreased energy   1  1  Change in appetite   0  0  Feeling bad or failure about yourself    0  0  Trouble concentrating   0  0  Moving slowly or fidgety/restless   0  0  Suicidal thoughts   0  0  PHQ-9 Score   2  2    Allergies  Allergen Reactions   Compazine [Prochlorperazine Edisylate] Other (See Comments)    Severe muscle requiring ER visit   Nsaids Nausea And Vomiting and Nausea Only   Penicillins Hives   Prochlorperazine     Sulfamethoxazole-Trimethoprim    Sulfa Antibiotics Rash   Social History   Social History Narrative   Marital status/children/pets: Married. One child.   Education/employment: Works as a Careers adviser:      -Wears a bicycle helmet riding a bike: Yes     -smoke alarm in the home:Yes     - wears seatbelt: Yes     - Feels safe in their relationships: Yes   Past Medical History:  Diagnosis Date   Abdominal hernia 04/01/2020   Abnormal cervical Papanicolaou smear 04/01/2020   Chicken pox    Chronic back pain    Chronic neck pain    Family history of heart disease 02/25/2014   Fibromyalgia 05/15/2013   GERD (gastroesophageal reflux disease) 05/15/2013   Heart murmur    Hyperlipemia 05/15/2013   Hypothyroidism 05/15/2013   IBS (irritable bowel syndrome)    Osteopenia    Osteoporosis 05/15/2013   History of past treatment with Forteo    Raynaud's disease 02/25/2014   Past Surgical History:  Procedure Laterality Date   COLONOSCOPY     TONSILLECTOMY     Family History  Problem Relation Age of Onset  Heart disease Mother    Arthritis Mother    Early death Mother 49   Heart attack Mother    Heart disease Father    Colon cancer Father    Hyperlipidemia Father    Hypertension Father    Heart disease Other        family history    Brain cancer Sister    Early death Sister 9   Liver cancer Brother    Early death Brother 59   Kidney disease Paternal Grandmother    Hypertension Paternal Grandfather    Heart disease Brother    Heart attack Brother    Esophageal cancer Neg Hx    Pancreatic cancer Neg Hx    Stomach cancer Neg Hx    Allergies as of 03/22/2023       Reactions   Compazine [prochlorperazine Edisylate] Other (See Comments)   Severe muscle requiring ER visit   Nsaids Nausea And Vomiting, Nausea Only   Penicillins Hives   Prochlorperazine    Sulfamethoxazole-trimethoprim    Sulfa Antibiotics Rash        Medication List        Accurate as of March 22, 2023  1:24 PM. If you have any questions, ask your nurse or doctor.          ALPRAZolam 0.5 MG tablet Commonly known as: XANAX Take 1 tablet (0.5 mg total) by mouth at bedtime as needed.   Biotin 1000 MCG tablet Take 1,000 mcg by mouth daily.   CO Q 10 PO Take 1 tablet by mouth daily.   estradiol 0.075 MG/24HR Commonly known as: VIVELLE-DOT Lyllana 0.075 mg/24 hr transdermal patch   KRILL OIL PO Take 1 tablet by mouth daily.   levothyroxine 25 MCG tablet Commonly known as: SYNTHROID Take 1 tablet (25 mcg total) by mouth daily.   MS Contin 30 MG 12 hr tablet Generic drug: morphine Take 30 mg by mouth every 8 (eight) hours.   Nitro-Bid 2 % ointment Generic drug: nitroGLYCERIN Apply 0.5 inches topically 2 (two) times daily as needed for chest pain (raynauds).   Repatha SureClick 140 MG/ML Soaj Generic drug: Evolocumab Inject 140 mg into the skin every 14 (fourteen) days.   traMADol 50 MG tablet Commonly known as: ULTRAM 1 tablet as needed Orally Once a day   TURMERIC PO Take 1,000 mg by mouth.   valACYclovir 500 MG tablet Commonly known as: VALTREX Take 500 mg by mouth daily.   VITAMIN B 12 PO Take 1 tablet by mouth daily.   VITAMIN K2-VITAMIN D3 PO Take by mouth.   zolpidem 12.5 MG CR tablet Commonly known as: AMBIEN CR Take 1 tablet (12.5 mg total) by mouth at bedtime as needed for sleep.        All past medical history, surgical history, allergies, family history, immunizations andmedications were updated in the EMR today and reviewed under the history and medication portions of their EMR.     ROS Negative, with the exception of above mentioned in HPI   Objective:  BP 118/81   Pulse 83   Temp 98 F (36.7 C)   Wt 119 lb 6.4 oz (54.2 kg)   SpO2 95%   BMI 19.87 kg/m  Body mass index is 19.87 kg/m. Physical Exam Vitals and nursing note reviewed.  Constitutional:      General: She is not in acute distress.    Appearance: Normal  appearance. She is normal weight. She is not ill-appearing or toxic-appearing.  HENT:  Head: Normocephalic and atraumatic.  Eyes:     General: No scleral icterus.       Right eye: No discharge.        Left eye: No discharge.     Extraocular Movements: Extraocular movements intact.     Conjunctiva/sclera: Conjunctivae normal.     Pupils: Pupils are equal, round, and reactive to light.  Musculoskeletal:        General: No tenderness.     Comments: Left groin: Very mild palpable area swelling?  Left groin just above the suprapubic margin.  No discrete lymph nodes palpated.  No tenderness on palpation.  Better visualized when patient standing.  Skin changes  Skin:    Findings: No rash.  Neurological:     Mental Status: She is alert and oriented to person, place, and time. Mental status is at baseline.     Motor: No weakness.     Coordination: Coordination normal.     Gait: Gait normal.  Psychiatric:        Mood and Affect: Mood normal.        Behavior: Behavior normal.        Thought Content: Thought content normal.        Judgment: Judgment normal.     No results found. No results found. No results found for this or any previous visit (from the past 24 hour(s)).  Assessment/Plan: Wanda Robertson is a 67 y.o. female present for OV for  Left groin mass: Area could represent possible small inguinal hernia, vascular congestion or lymphadenopathy.  Could not palpate any discrete lymphadenopathy and she is nontender. We discussed options as far as watchful waiting or obtaining imaging such as ultrasound to rule out hernia or lymph node enlargement as cause.  She would like to move forward with ultrasound imaging today.  Reviewed expectations re: course of current medical issues. Discussed self-management of symptoms. Outlined signs and symptoms indicating need for more acute intervention. Patient verbalized understanding and all questions were answered. Patient received an  After-Visit Summary.    Orders Placed This Encounter  Procedures   US Pelvis Limited   No orders of the defined types were placed in this encounter.  Referral Orders  No referral(s) requested today     Note is dictated utilizing voice recognition software. Although note has been proof read prior to signing, occasional typographical errors still can be missed. If any questions arise, please do not hesitate to call for verification.   electronically signed by:  Felix Pacini, DO  [facility] [place]

## 2023-03-22 NOTE — Patient Instructions (Addendum)
Return if symptoms worsen or fail to improve.        Great to see you today.  I have refilled the medication(s) we provide.   If labs were collected, we will inform you of lab results once received either by echart message or telephone call.   - echart message- for normal results that have been seen by the patient already.   - telephone call: abnormal results or if patient has not viewed results in their echart.  

## 2023-03-23 ENCOUNTER — Telehealth (HOSPITAL_BASED_OUTPATIENT_CLINIC_OR_DEPARTMENT_OTHER): Payer: Self-pay

## 2023-03-29 DIAGNOSIS — Z01419 Encounter for gynecological examination (general) (routine) without abnormal findings: Secondary | ICD-10-CM | POA: Diagnosis not present

## 2023-03-29 DIAGNOSIS — M8588 Other specified disorders of bone density and structure, other site: Secondary | ICD-10-CM | POA: Diagnosis not present

## 2023-03-29 DIAGNOSIS — Z681 Body mass index (BMI) 19 or less, adult: Secondary | ICD-10-CM | POA: Diagnosis not present

## 2023-03-30 ENCOUNTER — Other Ambulatory Visit: Payer: Self-pay | Admitting: Obstetrics & Gynecology

## 2023-03-30 DIAGNOSIS — N6489 Other specified disorders of breast: Secondary | ICD-10-CM

## 2023-04-02 ENCOUNTER — Ambulatory Visit (HOSPITAL_BASED_OUTPATIENT_CLINIC_OR_DEPARTMENT_OTHER)
Admission: RE | Admit: 2023-04-02 | Discharge: 2023-04-02 | Disposition: A | Discharge status: Home / Self Care | Payer: Medicare Other | Source: Ambulatory Visit | Attending: Family Medicine | Admitting: Family Medicine

## 2023-04-02 DIAGNOSIS — R1909 Other intra-abdominal and pelvic swelling, mass and lump: Secondary | ICD-10-CM | POA: Diagnosis not present

## 2023-04-25 DIAGNOSIS — N812 Incomplete uterovaginal prolapse: Secondary | ICD-10-CM | POA: Diagnosis not present

## 2023-05-02 DIAGNOSIS — M797 Fibromyalgia: Secondary | ICD-10-CM | POA: Diagnosis not present

## 2023-05-02 DIAGNOSIS — Z79891 Long term (current) use of opiate analgesic: Secondary | ICD-10-CM | POA: Diagnosis not present

## 2023-05-02 DIAGNOSIS — G894 Chronic pain syndrome: Secondary | ICD-10-CM | POA: Diagnosis not present

## 2023-05-02 DIAGNOSIS — M47812 Spondylosis without myelopathy or radiculopathy, cervical region: Secondary | ICD-10-CM | POA: Diagnosis not present

## 2023-05-07 ENCOUNTER — Ambulatory Visit: Admission: RE | Admit: 2023-05-07 | Payer: Medicare Other | Source: Ambulatory Visit

## 2023-05-07 ENCOUNTER — Ambulatory Visit
Admission: RE | Admit: 2023-05-07 | Discharge: 2023-05-07 | Disposition: A | Discharge status: Home / Self Care | Payer: Medicare Other | Source: Ambulatory Visit | Attending: Obstetrics & Gynecology | Admitting: Obstetrics & Gynecology

## 2023-05-07 ENCOUNTER — Other Ambulatory Visit: Payer: Self-pay | Admitting: Obstetrics & Gynecology

## 2023-05-07 DIAGNOSIS — N6012 Diffuse cystic mastopathy of left breast: Secondary | ICD-10-CM | POA: Diagnosis not present

## 2023-05-07 DIAGNOSIS — N6081 Other benign mammary dysplasias of right breast: Secondary | ICD-10-CM | POA: Diagnosis not present

## 2023-05-07 DIAGNOSIS — N6489 Other specified disorders of breast: Secondary | ICD-10-CM

## 2023-05-09 ENCOUNTER — Ambulatory Visit (INDEPENDENT_AMBULATORY_CARE_PROVIDER_SITE_OTHER): Payer: Medicare Other

## 2023-05-09 VITALS — Wt 119.0 lb

## 2023-05-09 DIAGNOSIS — Z0000 Encounter for general adult medical examination without abnormal findings: Secondary | ICD-10-CM | POA: Diagnosis not present

## 2023-05-09 NOTE — Patient Instructions (Signed)
Wanda Robertson , Thank you for taking time to come for your Medicare Wellness Visit. I appreciate your ongoing commitment to your health goals. Please review the following plan we discussed and let me know if I can assist you in the future.   These are the goals we discussed:  Goals      Patient Stated     None at this time     Patient Stated     Continue to stay healthy         This is a list of the screening recommended for you and due dates:  Health Maintenance  Topic Date Due   Hepatitis C Screening  Never done   Colon Cancer Screening  11/20/2014   Pneumonia Vaccine (1 of 1 - PCV) Never done   DEXA scan (bone density measurement)  03/25/2023   Flu Shot  05/17/2023   DTaP/Tdap/Td vaccine (2 - Td or Tdap) 10/17/2023   Medicare Annual Wellness Visit  05/08/2024   Mammogram  05/06/2025   Zoster (Shingles) Vaccine  Completed   HPV Vaccine  Aged Out   COVID-19 Vaccine  Discontinued    Advanced directives: Please bring a copy of your health care power of attorney and living will to the office at your convenience.  Conditions/risks identified: continue to stay healthy   Next appointment: Follow up in one year for your annual wellness visit    Preventive Care 65 Years and Older, Female Preventive care refers to lifestyle choices and visits with your health care provider that can promote health and wellness. What does preventive care include? A yearly physical exam. This is also called an annual well check. Dental exams once or twice a year. Routine eye exams. Ask your health care provider how often you should have your eyes checked. Personal lifestyle choices, including: Daily care of your teeth and gums. Regular physical activity. Eating a healthy diet. Avoiding tobacco and drug use. Limiting alcohol use. Practicing safe sex. Taking low-dose aspirin every day. Taking vitamin and mineral supplements as recommended by your health care provider. What happens during an annual  well check? The services and screenings done by your health care provider during your annual well check will depend on your age, overall health, lifestyle risk factors, and family history of disease. Counseling  Your health care provider may ask you questions about your: Alcohol use. Tobacco use. Drug use. Emotional well-being. Home and relationship well-being. Sexual activity. Eating habits. History of falls. Memory and ability to understand (cognition). Work and work Astronomer. Reproductive health. Screening  You may have the following tests or measurements: Height, weight, and BMI. Blood pressure. Lipid and cholesterol levels. These may be checked every 5 years, or more frequently if you are over 45 years old. Skin check. Lung cancer screening. You may have this screening every year starting at age 66 if you have a 30-pack-year history of smoking and currently smoke or have quit within the past 15 years. Fecal occult blood test (FOBT) of the stool. You may have this test every year starting at age 69. Flexible sigmoidoscopy or colonoscopy. You may have a sigmoidoscopy every 5 years or a colonoscopy every 10 years starting at age 61. Hepatitis C blood test. Hepatitis B blood test. Sexually transmitted disease (STD) testing. Diabetes screening. This is done by checking your blood sugar (glucose) after you have not eaten for a while (fasting). You may have this done every 1-3 years. Bone density scan. This is done to screen for osteoporosis.  You may have this done starting at age 61. Mammogram. This may be done every 1-2 years. Talk to your health care provider about how often you should have regular mammograms. Talk with your health care provider about your test results, treatment options, and if necessary, the need for more tests. Vaccines  Your health care provider may recommend certain vaccines, such as: Influenza vaccine. This is recommended every year. Tetanus, diphtheria,  and acellular pertussis (Tdap, Td) vaccine. You may need a Td booster every 10 years. Zoster vaccine. You may need this after age 44. Pneumococcal 13-valent conjugate (PCV13) vaccine. One dose is recommended after age 30. Pneumococcal polysaccharide (PPSV23) vaccine. One dose is recommended after age 19. Talk to your health care provider about which screenings and vaccines you need and how often you need them. This information is not intended to replace advice given to you by your health care provider. Make sure you discuss any questions you have with your health care provider. Document Released: 10/29/2015 Document Revised: 06/21/2016 Document Reviewed: 08/03/2015 Elsevier Interactive Patient Education  2017 [employer].  Fall Prevention in the Home Falls can cause injuries. They can happen to people of all ages. There are many things you can do to make your home safe and to help prevent falls. What can I do on the outside of my home? Regularly fix the edges of walkways and driveways and fix any cracks. Remove anything that might make you trip as you walk through a door, such as a raised step or threshold. Trim any bushes or trees on the path to your home. Use bright outdoor lighting. Clear any walking paths of anything that might make someone trip, such as rocks or tools. Regularly check to see if handrails are loose or broken. Make sure that both sides of any steps have handrails. Any raised decks and porches should have guardrails on the edges. Have any leaves, snow, or ice cleared regularly. Use sand or salt on walking paths during winter. Clean up any spills in your garage right away. This includes oil or grease spills. What can I do in the bathroom? Use night lights. Install grab bars by the toilet and in the tub and shower. Do not use towel bars as grab bars. Use non-skid mats or decals in the tub or shower. If you need to sit down in the shower, use a plastic, non-slip  stool. Keep the floor dry. Clean up any water that spills on the floor as soon as it happens. Remove soap buildup in the tub or shower regularly. Attach bath mats securely with double-sided non-slip rug tape. Do not have throw rugs and other things on the floor that can make you trip. What can I do in the bedroom? Use night lights. Make sure that you have a light by your bed that is easy to reach. Do not use any sheets or blankets that are too big for your bed. They should not hang down onto the floor. Have a firm chair that has side arms. You can use this for support while you get dressed. Do not have throw rugs and other things on the floor that can make you trip. What can I do in the kitchen? Clean up any spills right away. Avoid walking on wet floors. Keep items that you use a lot in easy-to-reach places. If you need to reach something above you, use a strong step stool that has a grab bar. Keep electrical cords out of the way. Do not use  floor polish or wax that makes floors slippery. If you must use wax, use non-skid floor wax. Do not have throw rugs and other things on the floor that can make you trip. What can I do with my stairs? Do not leave any items on the stairs. Make sure that there are handrails on both sides of the stairs and use them. Fix handrails that are broken or loose. Make sure that handrails are as long as the stairways. Check any carpeting to make sure that it is firmly attached to the stairs. Fix any carpet that is loose or worn. Avoid having throw rugs at the top or bottom of the stairs. If you do have throw rugs, attach them to the floor with carpet tape. Make sure that you have a light switch at the top of the stairs and the bottom of the stairs. If you do not have them, ask someone to add them for you. What else can I do to help prevent falls? Wear shoes that: Do not have high heels. Have rubber bottoms. Are comfortable and fit you well. Are closed at the  toe. Do not wear sandals. If you use a stepladder: Make sure that it is fully opened. Do not climb a closed stepladder. Make sure that both sides of the stepladder are locked into place. Ask someone to hold it for you, if possible. Clearly mark and make sure that you can see: Any grab bars or handrails. First and last steps. Where the edge of each step is. Use tools that help you move around (mobility aids) if they are needed. These include: Canes. Walkers. Scooters. Crutches. Turn on the lights when you go into a dark area. Replace any light bulbs as soon as they burn out. Set up your furniture so you have a clear path. Avoid moving your furniture around. If any of your floors are uneven, fix them. If there are any pets around you, be aware of where they are. Review your medicines with your doctor. Some medicines can make you feel dizzy. This can increase your chance of falling. Ask your doctor what other things that you can do to help prevent falls. This information is not intended to replace advice given to you by your health care provider. Make sure you discuss any questions you have with your health care provider. Document Released: 07/29/2009 Document Revised: 03/09/2016 Document Reviewed: 11/06/2014 Elsevier Interactive Patient Education  2017 [employer].

## 2023-05-09 NOTE — Progress Notes (Addendum)
Subjective:   Wanda Robertson is a 67 y.o. female who presents for Medicare Annual (Subsequent) preventive examination.  Vital Signs: Unable to obtain new vitals due to this being a telehealth visit.   Visit Complete: Virtual  I connected with  Wanda Robertson on 05/14/23 by a audio enabled telemedicine application and verified that I am speaking with the correct person using two identifiers.  Patient Location: Home  Provider Location: Home Office  I discussed the limitations of evaluation and management by telemedicine. The patient expressed understanding and agreed to proceed.  Review of Systems     Cardiac Risk Factors include: advanced age (>65men, >73 women);dyslipidemia     Objective:    Today's Vitals   05/09/23 1011  Weight: 119 lb (54 kg)   Body mass index is 19.8 kg/m.     05/09/2023   10:15 AM 04/26/2022   10:13 AM  Advanced Directives  Does Patient Have a Medical Advance Directive? Yes Yes  Type of Estate agent of [place];Living will Healthcare Power of Attorney  Copy of Healthcare Power of Attorney in Chart? No - copy requested No - copy requested    Current Medications (verified) Outpatient Encounter Medications as of 05/09/2023  Medication Sig   ALPRAZolam (XANAX) 0.5 MG tablet Take 1 tablet (0.5 mg total) by mouth at bedtime as needed.   Biotin 1000 MCG tablet Take 1,000 mcg by mouth daily.    Coenzyme Q10 (CO Q 10 PO) Take 1 tablet by mouth daily.    Cyanocobalamin (VITAMIN B 12 PO) Take 1 tablet by mouth daily.    estradiol (VIVELLE-DOT) 0.075 MG/24HR Lyllana 0.075 mg/24 hr transdermal patch   Evolocumab (REPATHA SURECLICK) 140 MG/ML SOAJ Inject 140 mg into the skin every 14 (fourteen) days.   KRILL OIL PO Take 1 tablet by mouth daily.    levothyroxine (SYNTHROID) 25 MCG tablet Take 1 tablet (25 mcg total) by mouth daily.   MS CONTIN 30 MG 12 hr tablet Take 30 mg by mouth every 8 (eight) hours.   nitroGLYCERIN  (NITRO-BID) 2 % ointment Apply 0.5 inches topically 2 (two) times daily as needed for chest pain (raynauds).   traMADol (ULTRAM) 50 MG tablet 1 tablet as needed Orally Once a day   TURMERIC PO Take 1,000 mg by mouth.   valACYclovir (VALTREX) 500 MG tablet Take 500 mg by mouth daily.   Vitamin D-Vitamin K (VITAMIN K2-VITAMIN D3 PO) Take by mouth.   zolpidem (AMBIEN CR) 12.5 MG CR tablet Take 1 tablet (12.5 mg total) by mouth at bedtime as needed for sleep.   No facility-administered encounter medications on file as of 05/09/2023.    Allergies (verified) Compazine [prochlorperazine edisylate], Nsaids, Penicillins, Prochlorperazine, Sulfamethoxazole-trimethoprim, and Sulfa antibiotics   History: Past Medical History:  Diagnosis Date   Abdominal hernia 04/01/2020   Abnormal cervical Papanicolaou smear 04/01/2020   Chicken pox    Chronic back pain    Chronic neck pain    Family history of heart disease 02/25/2014   Fibromyalgia 05/15/2013   GERD (gastroesophageal reflux disease) 05/15/2013   Heart murmur    Hyperlipemia 05/15/2013   Hypothyroidism 05/15/2013   IBS (irritable bowel syndrome)    Osteopenia    Osteoporosis 05/15/2013   History of past treatment with Forteo    Raynaud's disease 02/25/2014   Past Surgical History:  Procedure Laterality Date   COLONOSCOPY     TONSILLECTOMY     Family History  Problem Relation Age of Onset  Heart disease Mother    Arthritis Mother    Early death Mother 21   Heart attack Mother    Heart disease Father    Colon cancer Father    Hyperlipidemia Father    Hypertension Father    Heart disease Other        family history    Brain cancer Sister    Early death Sister 84   Liver cancer Brother    Early death Brother 15   Kidney disease Paternal Grandmother    Hypertension Paternal Grandfather    Heart disease Brother    Heart attack Brother    Esophageal cancer Neg Hx    Pancreatic cancer Neg Hx    Stomach cancer Neg Hx    Social  History   Socioeconomic History   Marital status: Married    Spouse name: Not on file   Number of children: 1   Years of education: Not on file   Highest education level: Associate degree: occupational, Scientist, product/process development, or vocational program  Occupational History   Not on file  Tobacco Use   Smoking status: Former   Smokeless tobacco: Never   Tobacco comments:    socially  Advertising account planner   Vaping status: Never Used  Substance and Sexual Activity   Alcohol use: No   Drug use: Yes    Types: Benzodiazepines, Morphine    Comment: prescribed tramadol, xanax, ambien  and morphine   Sexual activity: Yes    Partners: Male  Other Topics Concern   Not on file  Social History Narrative   Marital status/children/pets: Married. One child.   Education/employment: Works as a Careers adviser:      -Wears a bicycle helmet riding a bike: Yes     -smoke alarm in the home:Yes     - wears seatbelt: Yes     - Feels safe in their relationships: Yes   Social Determinants of Health   Financial Resource Strain: Low Risk  (05/09/2023)   Overall Financial Resource Strain (CARDIA)    Difficulty of Paying Living Expenses: Not hard at all  Food Insecurity: No Food Insecurity (05/09/2023)   Hunger Vital Sign    Worried About Running Out of Food in the Last Year: Never true    Ran Out of Food in the Last Year: Never true  Transportation Needs: No Transportation Needs (05/09/2023)   PRAPARE - Administrator, Civil Service (Medical): No    Lack of Transportation (Non-Medical): No  Physical Activity: Sufficiently Active (05/09/2023)   Exercise Vital Sign    Days of Exercise per Week: 5 days    Minutes of Exercise per Session: 60 min  Stress: No Stress Concern Present (05/09/2023)   [employer] of Occupational Health - Occupational Stress Questionnaire    Feeling of Stress : Not at all  Social Connections: Moderately Isolated (05/09/2023)   Social Connection and Isolation Panel [NHANES]     Frequency of Communication with Friends and Family: More than three times a week    Frequency of Social Gatherings with Friends and Family: Twice a week    Attends Religious Services: Never    Database administrator or Organizations: No    Attends Engineer, structural: Never    Marital Status: Married    Tobacco Counseling Counseling given: Not Answered Tobacco comments: socially   Clinical Intake:  Pre-visit preparation completed: Yes  Pain : No/denies pain     BMI - recorded: 19.8  Nutritional Status: BMI of 19-24  Normal Nutritional Risks: None Diabetes: No  How often do you need to have someone help you when you read instructions, pamphlets, or other written materials from your doctor or pharmacy?: 1 - Never  Interpreter Needed?: No  Information entered by :: Lanier Ensign, LPN   Activities of Daily Living    05/09/2023   10:13 AM  In your present state of health, do you have any difficulty performing the following activities:  Hearing? 0  Vision? 0  Difficulty concentrating or making decisions? 0  Walking or climbing stairs? 0  Dressing or bathing? 0  Doing errands, shopping? 0  Preparing Food and eating ? N  Using the Toilet? N  In the past six months, have you accidently leaked urine? N  Do you have problems with loss of bowel control? N  Managing your Medications? N  Managing your Finances? N  Housekeeping or managing your Housekeeping? N    Patient Care Team: Natalia Leatherwood, DO as PCP - General (Family Medicine) Lars Masson, MD as PCP - Cardiology (Cardiology) Napoleon Form, MD as Consulting Physician (Gastroenterology) Tobias Alexander, OD as Referring Physician (Optometry) Mitchel Honour, DO as Consulting Physician (Obstetrics and Gynecology) Thyra Breed, MD (Anesthesiology)  Indicate any recent Medical Services you may have received from other than [facility] providers in the past year (date may be approximate).      Assessment:   This is a routine wellness examination for Erian.  Hearing/Vision screen Hearing Screening - Comments:: Pt denies any hearing issues  Vision Screening - Comments:: Pt stated Dr Earlie Server for annual eye exams   Dietary issues and exercise activities discussed:     Goals Addressed             This Visit's Progress    Patient Stated       Continue to stay healthy        Depression Screen    05/09/2023   10:16 AM 03/22/2023    9:38 AM 02/07/2023    3:44 PM 06/12/2022    2:28 PM 04/26/2022   10:12 AM 12/28/2021    1:48 PM 06/15/2021    1:53 PM  PHQ 2/9 Scores  PHQ - 2 Score 0 0 0 0 0 0 0  PHQ- 9 Score    2  2 2     Fall Risk    05/09/2023   10:18 AM 03/22/2023    9:38 AM 02/07/2023    3:44 PM 02/06/2023    4:04 PM 04/26/2022   10:15 AM  Fall Risk   Falls in the past year? 0 0 0 0 0  Number falls in past yr: 0 0 0  0  Injury with Fall? 0 0 0  0  Risk for fall due to : Impaired vision No Fall Risks   Impaired vision  Follow up Falls prevention discussed Falls evaluation completed Falls evaluation completed  Falls prevention discussed    MEDICARE RISK AT HOME:    TIMED UP AND GO:  Was the test performed?  No    Cognitive Function:        05/09/2023   10:18 AM 04/26/2022   10:16 AM  6CIT Screen  What Year? 0 points 0 points  What month? 0 points 0 points  What time? 0 points 0 points  Count back from 20 0 points 0 points  Months in reverse 0 points 0 points  Repeat phrase 0 points 0 points  Total  Score 0 points 0 points    Immunizations Immunization History  Administered Date(s) Administered   Janssen (J&J) SARS-COV-2 Vaccination 01/16/2020   Tdap 10/16/2013   Zoster Recombinant(Shingrix) 10/05/2019, 07/16/2020   Zoster, Live 10/05/2019    TDAP status: Up to date  Flu Vaccine status: Declined, Education has been provided regarding the importance of this vaccine but patient still declined. Advised may receive this vaccine at local  pharmacy or Health Dept. Aware to provide a copy of the vaccination record if obtained from local pharmacy or Health Dept. Verbalized acceptance and understanding.  Pneumococcal vaccine status: Due, Education has been provided regarding the importance of this vaccine. Advised may receive this vaccine at local pharmacy or Health Dept. Aware to provide a copy of the vaccination record if obtained from local pharmacy or Health Dept. Verbalized acceptance and understanding.  Covid-19 vaccine status: Completed vaccines  Qualifies for Shingles Vaccine? Yes   Zostavax completed Yes   Shingrix Completed?: Yes  Screening Tests Health Maintenance  Topic Date Due   Hepatitis C Screening  Never done   Colonoscopy  11/20/2014   Pneumonia Vaccine 75+ Years old (1 of 1 - PCV) Never done   INFLUENZA VACCINE  05/17/2023   DTaP/Tdap/Td (2 - Td or Tdap) 10/17/2023   Medicare Annual Wellness (AWV)  05/08/2024   DEXA SCAN  03/28/2025   MAMMOGRAM  05/06/2025   Zoster Vaccines- Shingrix  Completed   HPV VACCINES  Aged Out   COVID-19 Vaccine  Discontinued    Health Maintenance  Health Maintenance Due  Topic Date Due   Hepatitis C Screening  Never done   Colonoscopy  11/20/2014   Pneumonia Vaccine 74+ Years old (1 of 1 - PCV) Never done    Colonoscopy postponed per pt   Mammogram status: Completed 05/07/23. Repeat every year  Bone Density status: Completed 03/29/23. Results reflect: Bone density results: OSTEOPOROSIS. Repeat every 2 years.  Additional Screening:  Hepatitis C Screening: does qualify  Vision Screening: Recommended annual ophthalmology exams for early detection of glaucoma and other disorders of the eye. Is the patient up to date with their annual eye exam?  Yes  Who is the provider or what is the name of the office in which the patient attends annual eye exams? Dr Earlie Server If pt is not established with a provider, would they like to be referred to a provider to establish care?  No .   Dental Screening: Recommended annual dental exams for proper oral hygiene   Community Resource Referral / Chronic Care Management: CRR required this visit?  No   CCM required this visit?  No     Plan:     I have personally reviewed and noted the following in the patient's chart:   Medical and social history Use of alcohol, tobacco or illicit drugs  Current medications and supplements including opioid prescriptions. Patient is currently taking opioid prescriptions. Information provided to patient regarding non-opioid alternatives. Patient advised to discuss non-opioid treatment plan with their provider. Functional ability and status Nutritional status Physical activity Advanced directives List of other physicians Hospitalizations, surgeries, and ER visits in previous 12 months Vitals Screenings to include cognitive, depression, and falls Referrals and appointments  In addition, I have reviewed and discussed with patient certain preventive protocols, quality metrics, and best practice recommendations. A written personalized care plan for preventive services as well as general preventive health recommendations were provided to patient.     Marzella Schlein, LPN   01/22/8118  After Visit Summary: (MyChart) Due to this being a telephonic visit, the after visit summary with patients personalized plan was offered to patient via MyChart   Nurse Notes: none

## 2023-05-16 NOTE — Progress Notes (Signed)
Cardiology Office Note    Date:  05/18/2023  ID:  Wanda, Robertson Jul 24, 1956, MRN [id number] PCP:  Natalia Leatherwood, DO  Cardiologist:  Meriam Sprague, MD  Electrophysiologist:  None   Chief Complaint: f/u coronary calcification,   History of Present Illness: .    Wanda Robertson is a 67 y.o. 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 She has had calcium scoring twice, the most recent in 05/2020. Report won't cross over but Dr. Lindaann Slough prior notes outline CAC was 503 / 97 percentile for age/sex. She also had monitor in 2021 showing 2 very short runs of SVT with the longest lasting 12 beats, rare PACs/PVCs, 61-143bpm, average 80bpm. Echo 06/2020 showed EF 55-60%, RVEF appeared mildly down but in other windows normal, mildly elevated PASP, mild MR, mild-moderate TR, mild aortic sclerosis without stenosis. She has a very significant FH of early CAD, her mother had MI in her 20s and died of another MI in early 30s. Her brother had a stent placed at age 38. She remotely saw Dr. Shirlee Latch, Dr. Delton See, then Dr. Shari Prows. Given asymptomatic nature she was previously managed with risk factor modification. At OV 10/2022 there was a description of some chest pressure and she declined stress testing. Dr. Shari Prows had recommended to let us know if she was having symptoms with exertion at which time stress testing could be pursued.   She returns for follow-up feeling great. She remains physically active and does pilates which can involve varying degrees of exertion and be challenging at times. She has not had any exertional angina, chest pain, chest burning, pressure, dyspnea, palpitations, syncope, or edema. She feels she's doing great. Initial BP 130/90, recheck by me 118/82.  She does not carry history of HTN.  Labwork independently reviewed: 10/2022 LDL 61, trig 50 04/2022 TSH wnl, K 4.4, Cr 0.75, LFTs ok, H/H OK  ROS: .    Please see the history of present illness.  All other systems are reviewed and otherwise negative.  Studies Reviewed: Marland Kitchen    EKG:  EKG is ordered today, personally reviewed, demonstrating ectopic atrial rhythm 88bpm with shortened PR interval, no delta wave (reviewed with Dr. Anne Fu), nonspecific TW flattening. QTc hand calculated at from lead I (TW best seen in this lead). Voltage low in some leads.  CV Studies: Cardiac studies reviewed are outlined and summarized above. Otherwise please see EMR for full report.   Physical Exam:    VS:  BP (!) 130/90   Pulse 88   Ht 5\' 5"  (1.651 m)   Wt 118 lb 6.4 oz (53.7 kg)   SpO2 98%   BMI 19.70 kg/m    Wt Readings from Last 3 Encounters:  05/18/23 118 lb 6.4 oz (53.7 kg)  05/09/23 119 lb (54 kg)  03/22/23 119 lb 6.4 oz (54.2 kg)    GEN: Well nourished, well developed in no acute distress NECK: No JVD; No carotid bruits CARDIAC: RRR, no murmurs, rubs, gallops RESPIRATORY:  Clear to auscultation without rales, wheezing or rhonchi  ABDOMEN: Soft, non-tender, non-distended EXTREMITIES:  No edema; No acute deformity   Asessement and Plan:.     1. CAD, HLD - she previously stopped daily ASA due to what sounds like minor purpura, no major bleeding or allergic reaction. She is willing  to re-try every other day. Update CBC today. She has previously been managed with risk factor modification if asymptomatic. She denies any anginal-type symptoms whatsoever including with exertion. We discussed warning signs and ER precautions. If she has develops any new symptoms she will let us know. Continue Repatha. Will get CMET/lipids today. She hasn't eaten yet. Blood pressure was top normal upon arrival but recheck was normal. We discussed periodically keeping eye on this at home. She reports it is  usually normal whenever she is seen for other appointments.  2. Mild MR, mild-moderate TR - by echo 2021. Guidelines would suggest 3 year f/u, will arrange f/u echo. Also had mildly elevated PASP on prior echo but no symptoms at this time.  3. PSVT, ectopic atrial rhythm, shortened PR interval - she has h/o brief PSVT on prior monitor. I reviewed her EKG with Dr. Anne Fu today given the shortened PR interval. Upon further inspection it looks like this is by virtue of an ectopic atrial rhythm that was actually present on prior tracings as well. Per our discussion this is of no acute clinical concern. She is not having any symptoms with this. Continue to monitor clinically. We'll also get routine lytes and TSH.    Disposition: F/u with Dr. Mayford Knife to establish care in 6 months per our discussion.  Signed, Laurann Montana, PA-C

## 2023-05-18 ENCOUNTER — Ambulatory Visit: Payer: Medicare Other | Attending: Physician Assistant | Admitting: Physician Assistant

## 2023-05-18 ENCOUNTER — Encounter: Payer: Self-pay | Admitting: Physician Assistant

## 2023-05-18 VITALS — BP 118/82 | HR 88 | Ht 65.0 in | Wt 118.4 lb

## 2023-05-18 DIAGNOSIS — I491 Atrial premature depolarization: Secondary | ICD-10-CM

## 2023-05-18 DIAGNOSIS — I471 Supraventricular tachycardia, unspecified: Secondary | ICD-10-CM | POA: Diagnosis not present

## 2023-05-18 DIAGNOSIS — I071 Rheumatic tricuspid insufficiency: Secondary | ICD-10-CM | POA: Diagnosis not present

## 2023-05-18 DIAGNOSIS — I2510 Atherosclerotic heart disease of native coronary artery without angina pectoris: Secondary | ICD-10-CM

## 2023-05-18 DIAGNOSIS — E785 Hyperlipidemia, unspecified: Secondary | ICD-10-CM | POA: Diagnosis not present

## 2023-05-18 DIAGNOSIS — I340 Nonrheumatic mitral (valve) insufficiency: Secondary | ICD-10-CM | POA: Diagnosis not present

## 2023-05-18 NOTE — Patient Instructions (Signed)
Medication Instructions:  Your physician recommends that you continue on your current medications as directed. Please refer to the Current Medication list given to you today.  Restart Aspirin 81 mg every other day  *If you need a refill on your cardiac medications before your next appointment, please call your pharmacy*   Lab Work: CMET, CBC, Lipids, TSH If you have labs (blood work) drawn today and your tests are completely normal, you will receive your results only by: MyChart Message (if you have MyChart) OR A paper copy in the mail If you have any lab test that is abnormal or we need to change your treatment, we will call you to review the results.   Testing/Procedures: Your physician has requested that you have an echocardiogram. Echocardiography is a painless test that uses sound waves to create images of your heart. It provides your doctor with information about the size and shape of your heart and how well your heart's chambers and valves are working. This procedure takes approximately one hour. There are no restrictions for this procedure. Please do NOT wear cologne, perfume, aftershave, or lotions (deodorant is allowed). Please arrive 15 minutes prior to your appointment time.    Follow-Up: At [facility], you and your health needs are our priority.  As part of our continuing mission to provide you with exceptional heart care, we have created designated Provider Care Teams.  These Care Teams include your primary Cardiologist (physician) and Advanced Practice Providers (APPs -  Physician Assistants and Nurse Practitioners) who all work together to provide you with the care you need, when you need it.   Your next appointment:   6 month(s)  Provider:   Dr Mayford Knife

## 2023-05-30 DIAGNOSIS — L820 Inflamed seborrheic keratosis: Secondary | ICD-10-CM | POA: Diagnosis not present

## 2023-06-06 ENCOUNTER — Ambulatory Visit (HOSPITAL_COMMUNITY): Payer: Medicare Other | Attending: Physician Assistant

## 2023-06-06 DIAGNOSIS — I471 Supraventricular tachycardia, unspecified: Secondary | ICD-10-CM | POA: Diagnosis not present

## 2023-06-06 DIAGNOSIS — E785 Hyperlipidemia, unspecified: Secondary | ICD-10-CM | POA: Diagnosis not present

## 2023-06-06 DIAGNOSIS — I2510 Atherosclerotic heart disease of native coronary artery without angina pectoris: Secondary | ICD-10-CM | POA: Diagnosis not present

## 2023-06-06 DIAGNOSIS — I491 Atrial premature depolarization: Secondary | ICD-10-CM

## 2023-06-06 DIAGNOSIS — I071 Rheumatic tricuspid insufficiency: Secondary | ICD-10-CM

## 2023-06-06 DIAGNOSIS — I340 Nonrheumatic mitral (valve) insufficiency: Secondary | ICD-10-CM | POA: Insufficient documentation

## 2023-06-06 LAB — ECHOCARDIOGRAM COMPLETE
Area-P 1/2: 4.23 cm2
S' Lateral: 2.7 cm

## 2023-06-25 DIAGNOSIS — Z79891 Long term (current) use of opiate analgesic: Secondary | ICD-10-CM | POA: Diagnosis not present

## 2023-06-25 DIAGNOSIS — M47812 Spondylosis without myelopathy or radiculopathy, cervical region: Secondary | ICD-10-CM | POA: Diagnosis not present

## 2023-06-25 DIAGNOSIS — G894 Chronic pain syndrome: Secondary | ICD-10-CM | POA: Diagnosis not present

## 2023-06-25 DIAGNOSIS — M797 Fibromyalgia: Secondary | ICD-10-CM | POA: Diagnosis not present

## 2023-08-08 ENCOUNTER — Other Ambulatory Visit: Payer: Self-pay | Admitting: Family Medicine

## 2023-08-08 NOTE — Telephone Encounter (Signed)
Pt scheduled for 09/19/23 states she just did a refill and is good until the end of Nov. Will call if needing to come before.

## 2023-08-08 NOTE — Telephone Encounter (Signed)
Have received a refill request for Xanax which is a controlled substance.  All controlled substances must have a face-to-face encounter in order for refills.  Patient last seen for her chronic conditions 02/07/2023.  She does not have an upcoming appointment scheduled.  She is due for her 7-month follow-up, which is why she has run out of medications.  Please schedule patient for an appointment for her chronic condition management.

## 2023-08-23 DIAGNOSIS — Q823 Incontinentia pigmenti: Secondary | ICD-10-CM | POA: Diagnosis not present

## 2023-09-05 DIAGNOSIS — Z79891 Long term (current) use of opiate analgesic: Secondary | ICD-10-CM | POA: Diagnosis not present

## 2023-09-05 DIAGNOSIS — G894 Chronic pain syndrome: Secondary | ICD-10-CM | POA: Diagnosis not present

## 2023-09-05 DIAGNOSIS — M47812 Spondylosis without myelopathy or radiculopathy, cervical region: Secondary | ICD-10-CM | POA: Diagnosis not present

## 2023-09-05 DIAGNOSIS — M797 Fibromyalgia: Secondary | ICD-10-CM | POA: Diagnosis not present

## 2023-09-19 ENCOUNTER — Ambulatory Visit: Payer: Medicare Other | Admitting: Family Medicine

## 2023-09-26 ENCOUNTER — Encounter: Payer: Self-pay | Admitting: Family Medicine

## 2023-09-26 ENCOUNTER — Other Ambulatory Visit: Payer: Self-pay | Admitting: Family Medicine

## 2023-09-26 ENCOUNTER — Telehealth: Payer: Medicare Other | Admitting: Family Medicine

## 2023-09-26 DIAGNOSIS — F5101 Primary insomnia: Secondary | ICD-10-CM | POA: Diagnosis not present

## 2023-09-26 DIAGNOSIS — E063 Autoimmune thyroiditis: Secondary | ICD-10-CM | POA: Diagnosis not present

## 2023-09-26 MED ORDER — ZOLPIDEM TARTRATE ER 12.5 MG PO TBCR: 12.5000 mg | EXTENDED_RELEASE_TABLET | Freq: Every evening | ORAL | 1 refills | Status: DC | PRN

## 2023-09-26 MED ORDER — ALPRAZOLAM 0.5 MG PO TABS: 0.5000 mg | ORAL_TABLET | Freq: Every evening | ORAL | 1 refills | Status: DC | PRN

## 2023-09-26 MED ORDER — LEVOTHYROXINE SODIUM 25 MCG PO TABS: 25.0000 ug | ORAL_TABLET | Freq: Every day | ORAL | 1 refills | Status: DC

## 2023-09-26 NOTE — Telephone Encounter (Signed)
Pt has appt today, 12/11.

## 2023-09-26 NOTE — Progress Notes (Signed)
VIRTUAL VISIT VIA VIDEO  I connected with Wanda Robertson on 09/26/23 at  1:40 PM EST by a video enabled telemedicine application and verified that I am speaking with the correct person using two identifiers. Location patient: Home Location provider: [facility], Office Persons participating in the virtual visit: Patient, Dr. Claiborne Billings and Ivonne Andrew, CMA  I discussed the limitations of evaluation and management by telemedicine and the availability of in person appointments. The patient expressed understanding and agreed to proceed.   Patient ID: Wanda Robertson, female  DOB: January 16, 1956, 67 y.o.   MRN: [id number] Patient Care Team    Relationship Specialty Notifications Start End  Natalia Leatherwood, DO PCP - General Family Medicine  07/16/20   Meriam Sprague, MD (Inactive) PCP - Cardiology Cardiology  05/16/23   Napoleon Form, MD Consulting Physician Gastroenterology  07/21/20   Tobias Alexander, OD Referring Physician Optometry  07/21/20   Mitchel Honour, DO Consulting Physician Obstetrics and Gynecology  07/21/20   Thyra Breed, MD  Anesthesiology  07/21/20    Comment: pain management    Chief Complaint  Patient presents with   Insomnia    cmc    Subjective: [name] is a 67 y.o.  female present for chronic condition management.  All past medical history was reviewed and updated if appropriate.  Primary insomnia Patient reports the Ambien 12.5 mg nightly is working well or her insomnia.  Occasionally she will use the Xanax 0.5 mg nightly if she is unable to fall asleep only.  She denies complications or oversedation Prior note: Patient reports she has been prescribed Ambien 12.5 mg nightly as needed for insomnia for many years. She also is prescribed Xanax 0.5 mg nightly as needed for insomnia. She states she does not take both of these at the same time typically. Depending upon her sleep pattern. She is prescribed tramadol and morphine through her  pain specialist. She feels she is doing much better. She states they are aware of her Ambien and benzodiazepine use. She denies any sedation with these medications.  Chronic neck pain/Chronic back pain, unspecified back location, unspecified back pain laterality/Cervicalgia Patient is prescribed morphine and tramadol through her pain management specialist.     05/09/2023   10:16 AM 03/22/2023    9:38 AM 02/07/2023    3:44 PM 06/12/2022    2:28 PM 04/26/2022   10:12 AM  Depression screen PHQ 2/9  Decreased Interest 0 0 0 0 0  Down, Depressed, Hopeless 0 0 0 0 0  PHQ - 2 Score 0 0 0 0 0  Altered sleeping    1   Tired, decreased energy    1   Change in appetite    0   Feeling bad or failure about yourself     0   Trouble concentrating    0   Moving slowly or fidgety/restless    0   Suicidal thoughts    0   PHQ-9 Score    2       06/12/2022    2:28 PM 12/28/2021    1:48 PM 06/15/2021    1:53 PM  GAD 7 : Generalized Anxiety Score  Nervous, Anxious, on Edge 0 0 1  Control/stop worrying 0 0 0  Worry too much - different things 0 0 0  Trouble relaxing 1 1 0  Restless 0 0 0  Easily annoyed or irritable 0 0 0  Afraid - awful might  happen 0 0 0  Total GAD 7 Score 1 1 1            05/09/2023   10:18 AM 03/22/2023    9:38 AM 02/07/2023    3:44 PM 02/06/2023    4:04 PM 04/26/2022   10:15 AM  Fall Risk   Falls in the past year? 0 0 0 0 0  Number falls in past yr: 0 0 0  0  Injury with Fall? 0 0 0  0  Risk for fall due to : Impaired vision No Fall Risks   Impaired vision  Follow up Falls prevention discussed Falls evaluation completed Falls evaluation completed  Falls prevention discussed     Immunization History  Administered Date(s) Administered   Janssen (J&J) SARS-COV-2 Vaccination 01/16/2020   Tdap 10/16/2013   Zoster Recombinant(Shingrix) 10/05/2019, 07/16/2020   Zoster, Live 10/05/2019    No results found.  Past Medical History:  Diagnosis Date   Abdominal hernia  04/01/2020   Abnormal cervical Papanicolaou smear 04/01/2020   Chicken pox    Chronic back pain    Chronic neck pain    Family history of heart disease 02/25/2014   Fibromyalgia 05/15/2013   GERD (gastroesophageal reflux disease) 05/15/2013   Heart murmur    Hyperlipemia 05/15/2013   Hypothyroidism 05/15/2013   IBS (irritable bowel syndrome)    Osteopenia    Osteoporosis 05/15/2013   History of past treatment with Forteo    Raynaud's disease 02/25/2014   Allergies  Allergen Reactions   Compazine [Prochlorperazine Edisylate] Other (See Comments)    Severe muscle requiring ER visit   Nsaids Nausea And Vomiting and Nausea Only   Penicillins Hives   Prochlorperazine    Sulfamethoxazole-Trimethoprim    Sulfa Antibiotics Rash   Past Surgical History:  Procedure Laterality Date   COLONOSCOPY     TONSILLECTOMY     Family History  Problem Relation Age of Onset   Heart disease Mother    Arthritis Mother    Early death Mother 38   Heart attack Mother    Heart disease Father    Colon cancer Father    Hyperlipidemia Father    Hypertension Father    Heart disease Other        family history    Brain cancer Sister    Early death Sister 54   Liver cancer Brother    Early death Brother 20   Kidney disease Paternal Grandmother    Hypertension Paternal Grandfather    Heart disease Brother    Heart attack Brother    Esophageal cancer Neg Hx    Pancreatic cancer Neg Hx    Stomach cancer Neg Hx    Social History   Social History Narrative   Marital status/children/pets: Married. One child.   Education/employment: Works as a Careers adviser:      -Wears a bicycle helmet riding a bike: Yes     -smoke alarm in the home:Yes     - wears seatbelt: Yes     - Feels safe in their relationships: Yes    Allergies as of 09/26/2023       Reactions   Compazine [prochlorperazine Edisylate] Other (See Comments)   Severe muscle requiring ER visit   Nsaids Nausea And Vomiting, Nausea  Only   Penicillins Hives   Prochlorperazine    Sulfamethoxazole-trimethoprim    Sulfa Antibiotics Rash        Medication List        Accurate  as of September 26, 2023  1:56 PM. If you have any questions, ask your nurse or doctor.          STOP taking these medications    estradiol 0.075 MG/24HR Commonly known as: VIVELLE-DOT Stopped by: Felix Pacini   terbinafine 250 MG tablet Commonly known as: LAMISIL Stopped by: Felix Pacini       TAKE these medications    ALPRAZolam 0.5 MG tablet Commonly known as: XANAX Take 1 tablet (0.5 mg total) by mouth at bedtime as needed.   Biotin 1000 MCG tablet Take 1,000 mcg by mouth daily.   CO Q 10 PO Take 1 tablet by mouth daily.   Imvexxy Starter Pack 4 MCG Inst Generic drug: Estradiol Starter Pack   KRILL OIL PO Take 1 tablet by mouth daily.   levothyroxine 25 MCG tablet Commonly known as: SYNTHROID Take 1 tablet (25 mcg total) by mouth daily.   MS Contin 30 MG 12 hr tablet Generic drug: morphine Take 30 mg by mouth every 8 (eight) hours.   Nitro-Bid 2 % ointment Generic drug: nitroGLYCERIN Apply 0.5 inches topically 2 (two) times daily as needed for chest pain (raynauds).   Repatha SureClick 140 MG/ML Soaj Generic drug: Evolocumab Inject 140 mg into the skin every 14 (fourteen) days.   traMADol 50 MG tablet Commonly known as: ULTRAM 1 tablet as needed Orally Once a day   TURMERIC PO Take 1,000 mg by mouth.   valACYclovir 500 MG tablet Commonly known as: VALTREX Take 500 mg by mouth daily.   VITAMIN B 12 PO Take 1 tablet by mouth daily.   VITAMIN K2-VITAMIN D3 PO Take by mouth.   zolpidem 12.5 MG CR tablet Commonly known as: AMBIEN CR Take 1 tablet (12.5 mg total) by mouth at bedtime as needed for sleep.        All past medical history, surgical history, allergies, family history, immunizations andmedications were updated in the EMR today and reviewed under the history and medication  portions of their EMR.      ROS: 14 pt review of systems performed and negative (unless mentioned in an HPI)  Objective: There were no vitals taken for this visit. Physical Exam Vitals and nursing note reviewed.  Constitutional:      General: She is not in acute distress.    Appearance: Normal appearance. She is not toxic-appearing.  HENT:     Head: Normocephalic and atraumatic.  Eyes:     General: No scleral icterus.       Right eye: No discharge.        Left eye: No discharge.     Conjunctiva/sclera: Conjunctivae normal.  Pulmonary:     Effort: Pulmonary effort is normal.  Musculoskeletal:     Cervical back: Normal range of motion.  Skin:    Findings: No rash.  Neurological:     Mental Status: She is alert and oriented to person, place, and time. Mental status is at baseline.  Psychiatric:        Mood and Affect: Mood normal.        Behavior: Behavior normal.        Thought Content: Thought content normal.        Judgment: Judgment normal.    Assessment/plan: Paraskevi Hoston is a 67 y.o. female present for  Primary insomnia Stable -  She is on multiple sedating medications. She denies any sedation and reports her pain management team are aware of her Ambien and her benzodiazepine  use. She reports she typically does not use the benzodiazepine and the Ambien at the same time. Continue Xanax 0.5 mg nightly as needed Continue zolpidem (AMBIEN CR) 12.5 MG CR tablet [place] controlled substance database was reviewed 09/26/23   Hypothyroidism/fatigue: Continue levo 25 mcg qd.  Labs up-to-date 05/18/2023 with normal TSH.  Chronic neck pain/Chronic back pain, unspecified back location, unspecified back pain laterality/Cervicalgia Patient is prescribed tramadol and Kadian through her pain management team.  Mixed hyperlipidemia Repatha by cardio  Raynaud's disease without gangrene Managed by rheumatology  Return in about 24 weeks (around 03/12/2024) for Routine  chronic condition follow-up.  No orders of the defined types were placed in this encounter.   Meds ordered this encounter  Medications   zolpidem (AMBIEN CR) 12.5 MG CR tablet    Sig: Take 1 tablet (12.5 mg total) by mouth at bedtime as needed for sleep.    Dispense:  90 tablet    Refill:  1   levothyroxine (SYNTHROID) 25 MCG tablet    Sig: Take 1 tablet (25 mcg total) by mouth daily.    Dispense:  90 tablet    Refill:  1   ALPRAZolam (XANAX) 0.5 MG tablet    Sig: Take 1 tablet (0.5 mg total) by mouth at bedtime as needed.    Dispense:  90 tablet    Refill:  1    Referral Orders  No referral(s) requested today     Note is dictated utilizing voice recognition software. Although note has been proof read prior to signing, occasional typographical errors still can be missed. If any questions arise, please do not hesitate to call for verification.  Electronically signed by: Felix Pacini, DO [facility]

## 2023-09-26 NOTE — Patient Instructions (Addendum)

## 2023-10-18 ENCOUNTER — Encounter: Payer: Self-pay | Admitting: Pharmacist

## 2023-11-07 DIAGNOSIS — K080 Exfoliation of teeth due to systemic causes: Secondary | ICD-10-CM | POA: Diagnosis not present

## 2023-11-08 MED ORDER — REPATHA SURECLICK 140 MG/ML ~~LOC~~ SOAJ: 1.0000 | SUBCUTANEOUS | 3 refills | Status: DC

## 2023-11-14 ENCOUNTER — Other Ambulatory Visit (HOSPITAL_COMMUNITY): Payer: Self-pay

## 2023-11-14 ENCOUNTER — Telehealth: Payer: Self-pay | Admitting: Pharmacy Technician

## 2023-11-14 NOTE — Telephone Encounter (Signed)
Pharmacy Patient Advocate Encounter   Received notification from Patient Advice Request messages that prior authorization for repatha is required/requested.   Insurance verification completed.   The patient is insured through [facility] .   Per test claim: PA required; PA submitted to above mentioned insurance via CoverMyMeds Key/confirmation #/EOC [facility] Status is pending

## 2023-11-15 ENCOUNTER — Other Ambulatory Visit (HOSPITAL_COMMUNITY): Payer: Self-pay

## 2023-11-15 NOTE — Telephone Encounter (Signed)
Pharmacy Patient Advocate Encounter  Received notification from [facility] that Prior Authorization for repatha has been APPROVED from 11/14/23 to 11/13/24. Ran test claim, Copay is $510.00 - 3 months. This test claim was processed through [facility]- copay amounts may vary at other pharmacies due to pharmacy/plan contracts, or as the patient moves through the different stages of their insurance plan.   PA #/Case ID/Reference #: [id number]

## 2023-11-23 ENCOUNTER — Telehealth: Payer: Self-pay | Admitting: Pharmacy Technician

## 2023-11-23 ENCOUNTER — Other Ambulatory Visit (HOSPITAL_COMMUNITY): Payer: Self-pay

## 2023-11-23 NOTE — Telephone Encounter (Signed)
 Per media scan repatha  needs a pa but per reject it does not

## 2023-11-28 DIAGNOSIS — M797 Fibromyalgia: Secondary | ICD-10-CM | POA: Diagnosis not present

## 2023-11-28 DIAGNOSIS — M47812 Spondylosis without myelopathy or radiculopathy, cervical region: Secondary | ICD-10-CM | POA: Diagnosis not present

## 2023-11-28 DIAGNOSIS — G894 Chronic pain syndrome: Secondary | ICD-10-CM | POA: Diagnosis not present

## 2023-11-28 DIAGNOSIS — Z79891 Long term (current) use of opiate analgesic: Secondary | ICD-10-CM | POA: Diagnosis not present

## 2024-02-07 DIAGNOSIS — M47812 Spondylosis without myelopathy or radiculopathy, cervical region: Secondary | ICD-10-CM | POA: Diagnosis not present

## 2024-02-07 DIAGNOSIS — Z79891 Long term (current) use of opiate analgesic: Secondary | ICD-10-CM | POA: Diagnosis not present

## 2024-02-07 DIAGNOSIS — G894 Chronic pain syndrome: Secondary | ICD-10-CM | POA: Diagnosis not present

## 2024-02-07 DIAGNOSIS — M797 Fibromyalgia: Secondary | ICD-10-CM | POA: Diagnosis not present

## 2024-03-21 ENCOUNTER — Other Ambulatory Visit: Payer: Self-pay | Admitting: Family Medicine

## 2024-04-03 DIAGNOSIS — Z01419 Encounter for gynecological examination (general) (routine) without abnormal findings: Secondary | ICD-10-CM | POA: Diagnosis not present

## 2024-04-03 DIAGNOSIS — Z124 Encounter for screening for malignant neoplasm of cervix: Secondary | ICD-10-CM | POA: Diagnosis not present

## 2024-04-03 DIAGNOSIS — Z6820 Body mass index (BMI) 20.0-20.9, adult: Secondary | ICD-10-CM | POA: Diagnosis not present

## 2024-04-07 ENCOUNTER — Ambulatory Visit (INDEPENDENT_AMBULATORY_CARE_PROVIDER_SITE_OTHER): Admitting: Family Medicine

## 2024-04-07 ENCOUNTER — Encounter: Payer: Self-pay | Admitting: Family Medicine

## 2024-04-07 VITALS — BP 118/78 | HR 86 | Temp 98.1°F | Wt 114.2 lb

## 2024-04-07 DIAGNOSIS — E063 Autoimmune thyroiditis: Secondary | ICD-10-CM | POA: Diagnosis not present

## 2024-04-07 DIAGNOSIS — Z1211 Encounter for screening for malignant neoplasm of colon: Secondary | ICD-10-CM

## 2024-04-07 DIAGNOSIS — F5101 Primary insomnia: Secondary | ICD-10-CM | POA: Diagnosis not present

## 2024-04-07 DIAGNOSIS — E782 Mixed hyperlipidemia: Secondary | ICD-10-CM

## 2024-04-07 LAB — CBC
HCT: 39.7 % (ref 36.0–46.0)
Hemoglobin: 13.3 g/dL (ref 12.0–15.0)
MCHC: 33.5 g/dL (ref 30.0–36.0)
MCV: 95 fl (ref 78.0–100.0)
Platelets: 224 10*3/uL (ref 150.0–400.0)
RBC: 4.18 Mil/uL (ref 3.87–5.11)
RDW: 13.1 % (ref 11.5–15.5)
WBC: 5.5 10*3/uL (ref 4.0–10.5)

## 2024-04-07 LAB — COMPREHENSIVE METABOLIC PANEL WITH GFR
ALT: 13 U/L (ref 0–35)
AST: 18 U/L (ref 0–37)
Albumin: 3.9 g/dL (ref 3.5–5.2)
Alkaline Phosphatase: 42 U/L (ref 39–117)
BUN: 18 mg/dL (ref 6–23)
CO2: 31 meq/L (ref 19–32)
Calcium: 9 mg/dL (ref 8.4–10.5)
Chloride: 104 meq/L (ref 96–112)
Creatinine, Ser: 0.78 mg/dL (ref 0.40–1.20)
GFR: 78.46 mL/min (ref >60.00)
Glucose, Bld: 87 mg/dL (ref 70–99)
Potassium: 4.3 meq/L (ref 3.5–5.1)
Sodium: 139 meq/L (ref 135–145)
Total Bilirubin: 0.5 mg/dL (ref 0.2–1.2)
Total Protein: 6.5 g/dL (ref 6.0–8.3)

## 2024-04-07 LAB — TSH: TSH: 2.36 u[IU]/mL (ref 0.35–5.50)

## 2024-04-07 LAB — T4, FREE: Free T4: 1.1 ng/dL (ref 0.60–1.60)

## 2024-04-07 MED ORDER — ALPRAZOLAM 0.5 MG PO TABS: 0.5000 mg | ORAL_TABLET | Freq: Every evening | ORAL | 1 refills | Status: DC | PRN

## 2024-04-07 MED ORDER — ZOLPIDEM TARTRATE ER 12.5 MG PO TBCR: 12.5000 mg | EXTENDED_RELEASE_TABLET | Freq: Every evening | ORAL | 1 refills | Status: DC | PRN

## 2024-04-07 NOTE — Progress Notes (Signed)
 Patient ID: Wanda Robertson, female  DOB: 1956/04/17, 68 y.o.   MRN: [id number] Patient Care Team    Relationship Specialty Notifications Start End  Catherine Charlies LABOR, DO PCP - General Family Medicine  07/16/20   Hobart Powell BRAVO, MD (Inactive) PCP - Cardiology Cardiology  05/16/23   Shila Gustav GAILS, MD Consulting Physician Gastroenterology  07/21/20   Cammie Batters, OD Referring Physician Optometry  07/21/20   Dannielle Bouchard, DO Consulting Physician Obstetrics and Gynecology  07/21/20   Orlando Anes, MD  Anesthesiology  07/21/20    Comment: pain management    Chief Complaint  Patient presents with   Insomnia    Subjective: Wanda Robertson is a 68 y.o.  female present for chronic condition management.  All past medical history was reviewed and updated if appropriate.  Primary insomnia Patient reports the Ambien  12.5 mg nightly is working very well for her insomnia.  Occasionally she will use the Xanax  0.5 mg nightly if she is unable to fall asleep only.  She denies complications or oversedation Prior note: Patient reports she has been prescribed Ambien  12.5 mg nightly as needed for insomnia for many years. She also is prescribed Xanax  0.5 mg nightly as needed for insomnia. She states she does not take both of these at the same time typically. Depending upon her sleep pattern. She is prescribed tramadol and morphine through her pain specialist. She feels she is doing much better. She states they are aware of her Ambien  and benzodiazepine use. She denies any sedation with these medications.  Chronic neck pain/Chronic back pain, unspecified back location, unspecified back pain laterality/Cervicalgia Patient is prescribed morphine and tramadol through her pain management specialist.     05/09/2023   10:16 AM 03/22/2023    9:38 AM 02/07/2023    3:44 PM 06/12/2022    2:28 PM 04/26/2022   10:12 AM  Depression screen PHQ 2/9  Decreased Interest 0 0 0 0 0  Down, Depressed,  Hopeless 0 0 0 0 0  PHQ - 2 Score 0 0 0 0 0  Altered sleeping    1   Tired, decreased energy    1   Change in appetite    0   Feeling bad or failure about yourself     0   Trouble concentrating    0   Moving slowly or fidgety/restless    0   Suicidal thoughts    0   PHQ-9 Score    2       06/12/2022    2:28 PM 12/28/2021    1:48 PM 06/15/2021    1:53 PM  GAD 7 : Generalized Anxiety Score  Nervous, Anxious, on Edge 0 0 1  Control/stop worrying 0 0 0  Worry too much - different things 0 0 0  Trouble relaxing 1 1 0  Restless 0 0 0  Easily annoyed or irritable 0 0 0  Afraid - awful might happen 0 0 0  Total GAD 7 Score 1 1 1            05/09/2023   10:18 AM 03/22/2023    9:38 AM 02/07/2023    3:44 PM 02/06/2023    4:04 PM 04/26/2022   10:15 AM  Fall Risk   Falls in the past year? 0 0 0 0 0  Number falls in past yr: 0 0 0  0  Injury with Fall? 0 0 0  0  Risk for  fall due to : Impaired vision No Fall Risks   Impaired vision  Follow up Falls prevention discussed Falls evaluation completed Falls evaluation completed  Falls prevention discussed      Data saved with a previous flowsheet row definition     Immunization History  Administered Date(s) Administered   Janssen (J&J) SARS-COV-2 Vaccination 01/16/2020   Tdap 10/16/2013   Zoster Recombinant(Shingrix) 10/05/2019, 07/16/2020   Zoster, Live 10/05/2019    No results found.  Past Medical History:  Diagnosis Date   Abdominal hernia 04/01/2020   Abnormal cervical Papanicolaou smear 04/01/2020   Chicken pox    Chronic back pain    Chronic neck pain    Family history of heart disease 02/25/2014   Fibromyalgia 05/15/2013   GERD (gastroesophageal reflux disease) 05/15/2013   Heart murmur    Hyperlipemia 05/15/2013   Hypothyroidism 05/15/2013   IBS (irritable bowel syndrome)    Osteopenia    Osteoporosis 05/15/2013   History of past treatment with Forteo    Raynaud's disease 02/25/2014   Allergies  Allergen Reactions    Compazine [Prochlorperazine Edisylate] Other (See Comments)    Severe muscle requiring ER visit   Nsaids Nausea And Vomiting and Nausea Only   Penicillins Hives   Prochlorperazine    Sulfamethoxazole-Trimethoprim    Sulfa Antibiotics Rash   Past Surgical History:  Procedure Laterality Date   COLONOSCOPY     TONSILLECTOMY     Family History  Problem Relation Age of Onset   Heart disease Mother    Arthritis Mother    Early death Mother 26   Heart attack Mother    Heart disease Father    Colon cancer Father    Hyperlipidemia Father    Hypertension Father    Heart disease Other        family history    Brain cancer Sister    Early death Sister 67   Liver cancer Brother    Early death Brother 55   Kidney disease Paternal Grandmother    Hypertension Paternal Grandfather    Heart disease Brother    Heart attack Brother    Esophageal cancer Neg Hx    Pancreatic cancer Neg Hx    Stomach cancer Neg Hx    Social History   Social History Narrative   Marital status/children/pets: Married. One child.   Education/employment: Works as a Careers adviser:      -Wears a bicycle helmet riding a bike: Yes     -smoke alarm in the home:Yes     - wears seatbelt: Yes     - Feels safe in their relationships: Yes    Allergies as of 04/07/2024       Reactions   Compazine [prochlorperazine Edisylate] Other (See Comments)   Severe muscle requiring ER visit   Nsaids Nausea And Vomiting, Nausea Only   Penicillins Hives   Prochlorperazine    Sulfamethoxazole-trimethoprim    Sulfa Antibiotics Rash        Medication List        Accurate as of April 07, 2024 11:11 AM. If you have any questions, ask your nurse or doctor.          ALPRAZolam  0.5 MG tablet Commonly known as: XANAX  Take 1 tablet (0.5 mg total) by mouth at bedtime as needed.   Biotin 1000 MCG tablet Take 1,000 mcg by mouth daily.   CO Q 10 PO Take 1 tablet by mouth daily.   Imvexxy Starter Pack 4  MCG  Inst Generic drug: Estradiol Starter Pack   KRILL OIL PO Take 1 tablet by mouth daily.   levothyroxine  25 MCG tablet Commonly known as: SYNTHROID  TAKE 1 TABLET BY MOUTH DAILY   MS Contin 30 MG 12 hr tablet Generic drug: morphine Take 30 mg by mouth every 8 (eight) hours.   Nitro-Bid  2 % ointment Generic drug: nitroGLYCERIN  Apply 0.5 inches topically 2 (two) times daily as needed for chest pain (raynauds).   Repatha  SureClick 140 MG/ML Soaj Generic drug: Evolocumab  Inject 140 mg into the skin every 14 (fourteen) days.   traMADol 50 MG tablet Commonly known as: ULTRAM 1 tablet as needed Orally Once a day   TURMERIC PO Take 1,000 mg by mouth.   valACYclovir 500 MG tablet Commonly known as: VALTREX Take 500 mg by mouth daily.   VITAMIN B 12 PO Take 1 tablet by mouth daily.   VITAMIN K2-VITAMIN D3 PO Take by mouth.   zolpidem  12.5 MG CR tablet Commonly known as: AMBIEN  CR Take 1 tablet (12.5 mg total) by mouth at bedtime as needed for sleep.        All past medical history, surgical history, allergies, family history, immunizations andmedications were updated in the EMR today and reviewed under the history and medication portions of their EMR.      ROS: 14 pt review of systems performed and negative (unless mentioned in an HPI)  Objective: BP 118/78   Pulse 86   Temp 98.1 F (36.7 C)   Wt 114 lb 3.2 oz (51.8 kg)   SpO2 99%   BMI 19.00 kg/m  Physical Exam Vitals and nursing note reviewed.  Constitutional:      General: She is not in acute distress.    Appearance: Normal appearance. She is not toxic-appearing.  HENT:     Head: Normocephalic and atraumatic.   Eyes:     General: No scleral icterus.       Right eye: No discharge.        Left eye: No discharge.     Conjunctiva/sclera: Conjunctivae normal.   Pulmonary:     Effort: Pulmonary effort is normal.   Musculoskeletal:     Cervical back: Normal range of motion.   Skin:    Findings: No  rash.   Neurological:     Mental Status: She is alert and oriented to person, place, and time. Mental status is at baseline.   Psychiatric:        Mood and Affect: Mood normal.        Behavior: Behavior normal.        Thought Content: Thought content normal.        Judgment: Judgment normal.    Assessment/plan: Wanda Robertson is a 68 y.o. female present for  Primary insomnia stable -  She is on multiple sedating medications. She denies any sedation and reports her pain management team are aware of her Ambien  and her benzodiazepine use. She reports she typically does not use the benzodiazepine and the Ambien  at the same time. Continue  Xanax  0.5 mg nightly as needed Continue zolpidem  (AMBIEN  CR) 12.5 MG CR tablet [place]  controlled substance database was reviewed 04/07/24   Hypothyroidism/fatigue: Continue  levo 25 mcg qd. refills will be provided in appropriate dose based on lab result today TSH, t4 free collected today  Chronic neck pain/Chronic back pain, unspecified back location, unspecified back pain laterality/Cervicalgia Patient is prescribed tramadol and Kadian through her pain management team.  Mixed  hyperlipidemia Repatha  by cardio  Raynaud's disease without gangrene Managed by rheumatology  Colon cancer screen- cologuard   No follow-ups on file.  Orders Placed This Encounter  Procedures   Cologuard   Comp Met (CMET)   CBC   TSH   T4, free    Meds ordered this encounter  Medications   ALPRAZolam  (XANAX ) 0.5 MG tablet    Sig: Take 1 tablet (0.5 mg total) by mouth at bedtime as needed.    Dispense:  90 tablet    Refill:  1   zolpidem  (AMBIEN  CR) 12.5 MG CR tablet    Sig: Take 1 tablet (12.5 mg total) by mouth at bedtime as needed for sleep.    Dispense:  90 tablet    Refill:  1    Referral Orders  No referral(s) requested today     Note is dictated utilizing voice recognition software. Although note has been proof read prior to  signing, occasional typographical errors still can be missed. If any questions arise, please do not hesitate to call for verification.  Electronically signed by: Charlies Bellini, DO [facility]

## 2024-04-07 NOTE — Patient Instructions (Addendum)

## 2024-04-08 ENCOUNTER — Ambulatory Visit: Payer: Self-pay | Admitting: Family Medicine

## 2024-04-08 DIAGNOSIS — R195 Other fecal abnormalities: Secondary | ICD-10-CM

## 2024-04-08 MED ORDER — LEVOTHYROXINE SODIUM 25 MCG PO TABS: 25.0000 ug | ORAL_TABLET | Freq: Every day | ORAL | 3 refills | Status: AC

## 2024-04-10 DIAGNOSIS — Z79891 Long term (current) use of opiate analgesic: Secondary | ICD-10-CM | POA: Diagnosis not present

## 2024-04-10 DIAGNOSIS — G894 Chronic pain syndrome: Secondary | ICD-10-CM | POA: Diagnosis not present

## 2024-04-10 DIAGNOSIS — M47812 Spondylosis without myelopathy or radiculopathy, cervical region: Secondary | ICD-10-CM | POA: Diagnosis not present

## 2024-04-10 DIAGNOSIS — M797 Fibromyalgia: Secondary | ICD-10-CM | POA: Diagnosis not present

## 2024-04-14 DIAGNOSIS — Z1211 Encounter for screening for malignant neoplasm of colon: Secondary | ICD-10-CM | POA: Diagnosis not present

## 2024-04-16 ENCOUNTER — Ambulatory Visit: Admitting: *Deleted

## 2024-04-16 VITALS — Ht 64.0 in | Wt 114.0 lb

## 2024-04-16 DIAGNOSIS — Z0000 Encounter for general adult medical examination without abnormal findings: Secondary | ICD-10-CM

## 2024-04-16 NOTE — Progress Notes (Signed)
 Subjective:   Wanda Robertson is a 68 y.o. female who presents for Medicare Annual (Subsequent) preventive examination.  Visit Complete: Virtual I connected with  Wanda Robertson on 04/16/24 by a audio enabled telemedicine application and verified that I am speaking with the correct person using two identifiers.  Patient Location: Home  Provider Location: Home Office  I discussed the limitations of evaluation and management by telemedicine. The patient expressed understanding and agreed to proceed.  Vital Signs: Because this visit was a virtual/telehealth visit, some criteria may be missing or patient reported. Any vitals not documented were not able to be obtained and vitals that have been documented are patient reported.   Cardiac Risk Factors include: advanced age (>42men, >29 women)     Objective:    Today's Vitals   04/16/24 1032  Weight: 114 lb (51.7 kg)  Height: 5' 4 (1.626 m)   Body mass index is 19.57 kg/m.     04/16/2024   10:27 AM 05/09/2023   10:15 AM 04/26/2022   10:13 AM  Advanced Directives  Does Patient Have a Medical Advance Directive? Yes Yes Yes  Type of Estate agent of [employer] Power of [place];Living will Healthcare Power of Attorney  Copy of Healthcare Power of Attorney in Chart? No - copy requested No - copy requested No - copy requested    Current Medications (verified) Outpatient Encounter Medications as of 04/16/2024  Medication Sig   ALPRAZolam  (XANAX ) 0.5 MG tablet Take 1 tablet (0.5 mg total) by mouth at bedtime as needed.   Biotin 1000 MCG tablet Take 1,000 mcg by mouth daily.    Coenzyme Q10 (CO Q 10 PO) Take 1 tablet by mouth daily.    Cyanocobalamin (VITAMIN B 12 PO) Take 1 tablet by mouth daily.    Evolocumab  (REPATHA  SURECLICK) 140 MG/ML SOAJ Inject 140 mg into the skin every 14 (fourteen) days.   IMVEXXY STARTER PACK 4 MCG INST    KRILL OIL PO Take 1 tablet by mouth daily.    levothyroxine   (SYNTHROID ) 25 MCG tablet Take 1 tablet (25 mcg total) by mouth daily.   MS CONTIN 30 MG 12 hr tablet Take 30 mg by mouth every 8 (eight) hours.   nitroGLYCERIN  (NITRO-BID ) 2 % ointment Apply 0.5 inches topically 2 (two) times daily as needed for chest pain (raynauds).   traMADol (ULTRAM) 50 MG tablet 1 tablet as needed Orally Once a day   TURMERIC PO Take 1,000 mg by mouth.   valACYclovir (VALTREX) 500 MG tablet Take 500 mg by mouth daily.   Vitamin D -Vitamin K (VITAMIN K2-VITAMIN D3 PO) Take by mouth.   zolpidem  (AMBIEN  CR) 12.5 MG CR tablet Take 1 tablet (12.5 mg total) by mouth at bedtime as needed for sleep.   No facility-administered encounter medications on file as of 04/16/2024.    Allergies (verified) Compazine [prochlorperazine edisylate], Nsaids, Penicillins, Prochlorperazine, Sulfamethoxazole-trimethoprim, and Sulfa antibiotics   History: Past Medical History:  Diagnosis Date   Abdominal hernia 04/01/2020   Abnormal cervical Papanicolaou smear 04/01/2020   Chicken pox    Chronic back pain    Chronic neck pain    Family history of heart disease 02/25/2014   Fibromyalgia 05/15/2013   GERD (gastroesophageal reflux disease) 05/15/2013   Heart murmur    Hyperlipemia 05/15/2013   Hypothyroidism 05/15/2013   IBS (irritable bowel syndrome)    Osteopenia    Osteoporosis 05/15/2013   History of past treatment with Forteo    Raynaud's  disease 02/25/2014   Past Surgical History:  Procedure Laterality Date   COLONOSCOPY     TONSILLECTOMY     Family History  Problem Relation Age of Onset   Heart disease Mother    Arthritis Mother    Early death Mother 67   Heart attack Mother    Heart disease Father    Colon cancer Father    Hyperlipidemia Father    Hypertension Father    Heart disease Other        family history    Brain cancer Sister    Early death Sister 25   Liver cancer Brother    Early death Brother 54   Kidney disease Paternal Grandmother    Hypertension Paternal  Grandfather    Heart disease Brother    Heart attack Brother    Esophageal cancer Neg Hx    Pancreatic cancer Neg Hx    Stomach cancer Neg Hx    Social History   Socioeconomic History   Marital status: Married    Spouse name: Not on file   Number of children: 1   Years of education: Not on file   Highest education level: Associate degree: academic program  Occupational History   Not on file  Tobacco Use   Smoking status: Former   Smokeless tobacco: Never   Tobacco comments:    socially  Advertising account planner   Vaping status: Never Used  Substance and Sexual Activity   Alcohol use: No   Drug use: Yes    Types: Benzodiazepines, Morphine    Comment: prescribed tramadol, xanax , ambien   and morphine   Sexual activity: Yes    Partners: Male  Other Topics Concern   Not on file  Social History Narrative   Marital status/children/pets: Married. One child.   Education/employment: Works as a Careers adviser:      -Wears a bicycle helmet riding a bike: Yes     -smoke alarm in the home:Yes     - wears seatbelt: Yes     - Feels safe in their relationships: Yes   Social Drivers of Corporate investment banker Strain: Low Risk  (04/16/2024)   Overall Financial Resource Strain (CARDIA)    Difficulty of Paying Living Expenses: Not hard at all  Food Insecurity: No Food Insecurity (04/16/2024)   Hunger Vital Sign    Worried About Running Out of Food in the Last Year: Never true    Ran Out of Food in the Last Year: Never true  Transportation Needs: No Transportation Needs (04/16/2024)   PRAPARE - Administrator, Civil Service (Medical): No    Lack of Transportation (Non-Medical): No  Physical Activity: Sufficiently Active (04/16/2024)   Exercise Vital Sign    Days of Exercise per Week: 5 days    Minutes of Exercise per Session: 50 min  Stress: No Stress Concern Present (04/16/2024)   [employer] of Occupational Health - Occupational Stress Questionnaire    Feeling of Stress:  Not at all  Social Connections: Moderately Integrated (04/16/2024)   Social Connection and Isolation Panel    Frequency of Communication with Friends and Family: More than three times a week    Frequency of Social Gatherings with Friends and Family: Once a week    Attends Religious Services: Never    Database administrator or Organizations: Yes    Attends Engineer, structural: More than 4 times per year    Marital Status: Married  Tobacco Counseling Counseling given: Not Answered Tobacco comments: socially   Clinical Intake:  Pre-visit preparation completed: Yes  Pain : No/denies pain     Diabetes: No  How often do you need to have someone help you when you read instructions, pamphlets, or other written materials from your doctor or pharmacy?: 1 - Never  Interpreter Needed?: No  Information entered by :: Mliss Graff LPN   Activities of Daily Living    04/16/2024   10:30 AM 05/09/2023   10:13 AM  In your present state of health, do you have any difficulty performing the following activities:  Hearing? 0 0  Vision? 0 0  Difficulty concentrating or making decisions? 0 0  Walking or climbing stairs? 0 0  Dressing or bathing? 0 0  Doing errands, shopping? 0 0  Preparing Food and eating ? N N  Using the Toilet? N N  In the past six months, have you accidently leaked urine? N N  Do you have problems with loss of bowel control? N N  Managing your Medications? N N  Managing your Finances? N N  Housekeeping or managing your Housekeeping? N N    Patient Care Team: Catherine Charlies LABOR, DO as PCP - General (Family Medicine) Hobart Powell BRAVO, MD (Inactive) as PCP - Cardiology (Cardiology) Shila Gustav GAILS, MD as Consulting Physician (Gastroenterology) Cammie Batters, OD as Referring Physician (Optometry) Dannielle Bouchard, DO as Consulting Physician (Obstetrics and Gynecology) Orlando Anes, MD (Anesthesiology)  Indicate any recent Medical Services you may have  received from other than [facility] providers in the past year (date may be approximate).     Assessment:   This is a routine wellness examination for [place].  Hearing/Vision screen Hearing Screening - Comments:: Up to date Vision Screening - Comments:: Up to date [facility]   Goals Addressed             This Visit's Progress    Increase physical activity       Patient Stated   On track    None at this time     Patient Stated   On track    Continue to stay healthy        Depression Screen    04/16/2024   10:35 AM 05/09/2023   10:16 AM 03/22/2023    9:38 AM 02/07/2023    3:44 PM 06/12/2022    2:28 PM 04/26/2022   10:12 AM 12/28/2021    1:48 PM  PHQ 2/9 Scores  PHQ - 2 Score 0 0 0 0 0 0 0  PHQ- 9 Score 2    2  2     Fall Risk    04/16/2024   10:27 AM 05/09/2023   10:18 AM 03/22/2023    9:38 AM 02/07/2023    3:44 PM 02/06/2023    4:04 PM  Fall Risk   Falls in the past year? 0 0 0 0 0  Number falls in past yr: 0 0 0 0   Injury with Fall? 0 0 0 0   Risk for fall due to :  Impaired vision No Fall Risks    Follow up Falls evaluation completed;Education provided;Falls prevention discussed Falls prevention discussed Falls evaluation completed Falls evaluation completed     MEDICARE RISK AT HOME: Medicare Risk at Home Any stairs in or around the home?: Yes If so, are there any without handrails?: No Home free of loose throw rugs in walkways, pet beds, electrical cords, etc?: Yes Adequate lighting in your home to  reduce risk of falls?: Yes Life alert?: No Use of a cane, walker or w/c?: No Grab bars in the bathroom?: No Shower chair or bench in shower?: No Elevated toilet seat or a handicapped toilet?: Yes  TIMED UP AND GO:  Was the test performed?  No    Cognitive Function:        04/16/2024   10:32 AM 05/09/2023   10:18 AM 04/26/2022   10:16 AM  6CIT Screen  What Year? 0 points 0 points 0 points  What month? 0 points 0 points 0 points  What time? 0 points 0 points 0 points   Count back from 20 0 points 0 points 0 points  Months in reverse 0 points 0 points 0 points  Repeat phrase 0 points 0 points 0 points  Total Score 0 points 0 points 0 points    Immunizations Immunization History  Administered Date(s) Administered   Janssen (J&J) SARS-COV-2 Vaccination 01/16/2020   Tdap 10/16/2013   Zoster Recombinant(Shingrix) 10/05/2019, 07/16/2020   Zoster, Live 10/05/2019    TDAP status: Due, Education has been provided regarding the importance of this vaccine. Advised may receive this vaccine at local pharmacy or Health Dept. Aware to provide a copy of the vaccination record if obtained from local pharmacy or Health Dept. Verbalized acceptance and understanding.  Flu Vaccine status: Up to date  Pneumococcal vaccine status: Declined,  Education has been provided regarding the importance of this vaccine but patient still declined. Advised may receive this vaccine at local pharmacy or Health Dept. Aware to provide a copy of the vaccination record if obtained from local pharmacy or Health Dept. Verbalized acceptance and understanding.   Covid-19 vaccine status: Declined, Education has been provided regarding the importance of this vaccine but patient still declined. Advised may receive this vaccine at local pharmacy or Health Dept.or vaccine clinic. Aware to provide a copy of the vaccination record if obtained from local pharmacy or Health Dept. Verbalized acceptance and understanding.  Qualifies for Shingles Vaccine? No   Zostavax completed Yes   Shingrix Completed?: Yes  Screening Tests Health Maintenance  Topic Date Due   Colonoscopy  09/25/2024 (Originally 11/20/2014)   INFLUENZA VACCINE  05/16/2024   DEXA SCAN  03/28/2025   Medicare Annual Wellness (AWV)  04/16/2025   MAMMOGRAM  05/06/2025   Zoster Vaccines- Shingrix  Completed   Hepatitis B Vaccines  Aged Out   HPV VACCINES  Aged Out   Meningococcal B Vaccine  Aged Out   DTaP/Tdap/Td  Discontinued    Pneumococcal Vaccine: 50+ Years  Discontinued   COVID-19 Vaccine  Discontinued   Hepatitis C Screening  Discontinued    Health Maintenance  There are no preventive care reminders to display for this patient.   Colonoscopy   Education provided   Mammogram status: Completed  . Repeat every year  Bone Density status: Completed 2024. Results reflect: Bone density results: OSTEOPENIA. Repeat every 2 years.  Lung Cancer Screening: (Low Dose CT Chest recommended if Age 57-80 years, 20 pack-year currently smoking OR have quit w/in 15years.) does not qualify.   Lung Cancer Screening Referral:   Additional Screening:  Hepatitis C Screening  never done  Vision Screening: Recommended annual ophthalmology exams for early detection of glaucoma and other disorders of the eye. Is the patient up to date with their annual eye exam?  Yes  Who is the provider or what is the name of the office in which the patient attends annual eye exams? [facility] If pt  is not established with a provider, would they like to be referred to a provider to establish care? No .   Dental Screening: Recommended annual dental exams for proper oral hygiene    Community Resource Referral / Chronic Care Management: CRR required this visit?  No   CCM required this visit?  No     Plan:     I have personally reviewed and noted the following in the patient's chart:   Medical and social history Use of alcohol, tobacco or illicit drugs  Current medications and supplements including opioid prescriptions. Patient is not currently taking opioid prescriptions. Functional ability and status Nutritional status Physical activity Advanced directives List of other physicians Hospitalizations, surgeries, and ER visits in previous 12 months Vitals Screenings to include cognitive, depression, and falls Referrals and appointments  In addition, I have reviewed and discussed with patient certain preventive protocols, quality  metrics, and best practice recommendations. A written personalized care plan for preventive services as well as general preventive health recommendations were provided to patient.     Mliss Graff, LPN   11/22/7972   After Visit Summary: (MyChart) Due to this being a telephonic visit, the after visit summary with patients personalized plan was offered to patient via MyChart   Nurse Notes:

## 2024-04-16 NOTE — Patient Instructions (Signed)
 Wanda Robertson , Thank you for taking time to come for your Medicare Wellness Visit. I appreciate your ongoing commitment to your health goals. Please review the following plan we discussed and let me know if I can assist you in the future.   Screening recommendations/referrals: Colonoscopy: Education provided Mammogram: up to date Bone Density: up to date Recommended yearly ophthalmology/optometry visit for glaucoma screening and checkup Recommended yearly dental visit for hygiene and checkup  Vaccinations: Influenza vaccine: up to date Pneumococcal vaccine:  Tdap vaccine: Education provided Shingles vaccine: up to date       Preventive Care 65 Years and Older, Female Preventive care refers to lifestyle choices and visits with your health care provider that can promote health and wellness. What does preventive care include? A yearly physical exam. This is also called an annual well check. Dental exams once or twice a year. Routine eye exams. Ask your health care provider how often you should have your eyes checked. Personal lifestyle choices, including: Daily care of your teeth and gums. Regular physical activity. Eating a healthy diet. Avoiding tobacco and drug use. Limiting alcohol use. Practicing safe sex. Taking low-dose aspirin  every day. Taking vitamin and mineral supplements as recommended by your health care provider. What happens during an annual well check? The services and screenings done by your health care provider during your annual well check will depend on your age, overall health, lifestyle risk factors, and family history of disease. Counseling  Your health care provider may ask you questions about your: Alcohol use. Tobacco use. Drug use. Emotional well-being. Home and relationship well-being. Sexual activity. Eating habits. History of falls. Memory and ability to understand (cognition). Work and work Astronomer. Reproductive health. Screening  You may  have the following tests or measurements: Height, weight, and BMI. Blood pressure. Lipid and cholesterol levels. These may be checked every 5 years, or more frequently if you are over 40 years old. Skin check. Lung cancer screening. You may have this screening every year starting at age 56 if you have a 30-pack-year history of smoking and currently smoke or have quit within the past 15 years. Fecal occult blood test (FOBT) of the stool. You may have this test every year starting at age 27. Flexible sigmoidoscopy or colonoscopy. You may have a sigmoidoscopy every 5 years or a colonoscopy every 10 years starting at age 74. Hepatitis C blood test. Hepatitis B blood test. Sexually transmitted disease (STD) testing. Diabetes screening. This is done by checking your blood sugar (glucose) after you have not eaten for a while (fasting). You may have this done every 1-3 years. Bone density scan. This is done to screen for osteoporosis. You may have this done starting at age 45. Mammogram. This may be done every 1-2 years. Talk to your health care provider about how often you should have regular mammograms. Talk with your health care provider about your test results, treatment options, and if necessary, the need for more tests. Vaccines  Your health care provider may recommend certain vaccines, such as: Influenza vaccine. This is recommended every year. Tetanus, diphtheria, and acellular pertussis (Tdap, Td) vaccine. You may need a Td booster every 10 years. Zoster vaccine. You may need this after age 8. Pneumococcal 13-valent conjugate (PCV13) vaccine. One dose is recommended after age 48. Pneumococcal polysaccharide (PPSV23) vaccine. One dose is recommended after age 36. Talk to your health care provider about which screenings and vaccines you need and how often you need them. This information is not  intended to replace advice given to you by your health care provider. Make sure you discuss any  questions you have with your health care provider. Document Released: 10/29/2015 Document Revised: 06/21/2016 Document Reviewed: 08/03/2015 Elsevier Interactive Patient Education  2017 [employer].  Fall Prevention in the Home Falls can cause injuries. They can happen to people of all ages. There are many things you can do to make your home safe and to help prevent falls. What can I do on the outside of my home? Regularly fix the edges of walkways and driveways and fix any cracks. Remove anything that might make you trip as you walk through a door, such as a raised step or threshold. Trim any bushes or trees on the path to your home. Use bright outdoor lighting. Clear any walking paths of anything that might make someone trip, such as rocks or tools. Regularly check to see if handrails are loose or broken. Make sure that both sides of any steps have handrails. Any raised decks and porches should have guardrails on the edges. Have any leaves, snow, or ice cleared regularly. Use sand or salt on walking paths during winter. Clean up any spills in your garage right away. This includes oil or grease spills. What can I do in the bathroom? Use night lights. Install grab bars by the toilet and in the tub and shower. Do not use towel bars as grab bars. Use non-skid mats or decals in the tub or shower. If you need to sit down in the shower, use a plastic, non-slip stool. Keep the floor dry. Clean up any water that spills on the floor as soon as it happens. Remove soap buildup in the tub or shower regularly. Attach bath mats securely with double-sided non-slip rug tape. Do not have throw rugs and other things on the floor that can make you trip. What can I do in the bedroom? Use night lights. Make sure that you have a light by your bed that is easy to reach. Do not use any sheets or blankets that are too big for your bed. They should not hang down onto the floor. Have a firm chair that has side  arms. You can use this for support while you get dressed. Do not have throw rugs and other things on the floor that can make you trip. What can I do in the kitchen? Clean up any spills right away. Avoid walking on wet floors. Keep items that you use a lot in easy-to-reach places. If you need to reach something above you, use a strong step stool that has a grab bar. Keep electrical cords out of the way. Do not use floor polish or wax that makes floors slippery. If you must use wax, use non-skid floor wax. Do not have throw rugs and other things on the floor that can make you trip. What can I do with my stairs? Do not leave any items on the stairs. Make sure that there are handrails on both sides of the stairs and use them. Fix handrails that are broken or loose. Make sure that handrails are as long as the stairways. Check any carpeting to make sure that it is firmly attached to the stairs. Fix any carpet that is loose or worn. Avoid having throw rugs at the top or bottom of the stairs. If you do have throw rugs, attach them to the floor with carpet tape. Make sure that you have a light switch at the top of the stairs  and the bottom of the stairs. If you do not have them, ask someone to add them for you. What else can I do to help prevent falls? Wear shoes that: Do not have high heels. Have rubber bottoms. Are comfortable and fit you well. Are closed at the toe. Do not wear sandals. If you use a stepladder: Make sure that it is fully opened. Do not climb a closed stepladder. Make sure that both sides of the stepladder are locked into place. Ask someone to hold it for you, if possible. Clearly mark and make sure that you can see: Any grab bars or handrails. First and last steps. Where the edge of each step is. Use tools that help you move around (mobility aids) if they are needed. These include: Canes. Walkers. Scooters. Crutches. Turn on the lights when you go into a dark area.  Replace any light bulbs as soon as they burn out. Set up your furniture so you have a clear path. Avoid moving your furniture around. If any of your floors are uneven, fix them. If there are any pets around you, be aware of where they are. Review your medicines with your doctor. Some medicines can make you feel dizzy. This can increase your chance of falling. Ask your doctor what other things that you can do to help prevent falls. This information is not intended to replace advice given to you by your health care provider. Make sure you discuss any questions you have with your health care provider. Document Released: 07/29/2009 Document Revised: 03/09/2016 Document Reviewed: 11/06/2014 Elsevier Interactive Patient Education  2017 [employer].

## 2024-04-27 LAB — COLOGUARD: COLOGUARD: POSITIVE — AB

## 2024-04-28 NOTE — Telephone Encounter (Signed)
 Please inform patient of positive cologuard testing. A positive result does not necessarily mean cancer. It means that Cologuard detected DNA and/or  biomarkers in the stool which are associated with colon cancer or precancer. Patients with a positive result should have a diagnostic colonoscopy. Not investigating further now, could result in missed opportunity to diagnose and treat colon cancer or precancer.   I have placed a referral back to her GI to discuss ASAP.

## 2024-04-29 ENCOUNTER — Other Ambulatory Visit: Payer: Self-pay

## 2024-04-29 ENCOUNTER — Telehealth: Payer: Self-pay

## 2024-04-29 NOTE — Telephone Encounter (Signed)
 Please assist with recent gastro referral change per pt request to:  [facility] [address]  [place], [place]  [phone]

## 2024-04-29 NOTE — Telephone Encounter (Signed)
 See telephone encounter for referral info from patient

## 2024-04-29 NOTE — Telephone Encounter (Signed)
 See message.

## 2024-04-29 NOTE — Telephone Encounter (Signed)
 Yes, gastroenterology referral can be changed to her desired location.

## 2024-04-29 NOTE — Telephone Encounter (Signed)
 Referral - Request for Referral  Communication Did the patient discuss referral with their provider in the last year? No  (If No - schedule appointment)  (If Yes - send message)  Appointment offered? Yes   Type of order/referral and detailed reason for visit: abnormal cologuard / referral to [facility]  Preference of office, provider, location: [facility] on [address] [place], [place] [phone]   If referral order, have you been seen by this specialty before? Yes  (If Yes, this issue or another issue? When? Where? [facility] 2021  Can we respond through MyChart? Yes

## 2024-04-29 NOTE — Telephone Encounter (Signed)
 Called and spoke with pt. Pt given cologuard results. Pt requesting change of referral to [facility]. Please advise if okay to change referral.

## 2024-04-30 NOTE — Telephone Encounter (Signed)
 No further action needed at this time.

## 2024-05-01 ENCOUNTER — Encounter: Payer: Self-pay | Admitting: Family Medicine

## 2024-05-02 ENCOUNTER — Ambulatory Visit: Admitting: Physician Assistant

## 2024-05-06 DIAGNOSIS — K080 Exfoliation of teeth due to systemic causes: Secondary | ICD-10-CM | POA: Diagnosis not present

## 2024-05-07 DIAGNOSIS — K080 Exfoliation of teeth due to systemic causes: Secondary | ICD-10-CM | POA: Diagnosis not present

## 2024-05-15 ENCOUNTER — Ambulatory Visit: Attending: Internal Medicine | Admitting: Internal Medicine

## 2024-05-15 ENCOUNTER — Encounter: Payer: Self-pay | Admitting: Family Medicine

## 2024-05-15 ENCOUNTER — Encounter: Payer: Self-pay | Admitting: Internal Medicine

## 2024-05-15 VITALS — BP 138/89 | HR 93 | Ht 64.0 in

## 2024-05-15 DIAGNOSIS — E782 Mixed hyperlipidemia: Secondary | ICD-10-CM | POA: Diagnosis not present

## 2024-05-15 DIAGNOSIS — I471 Supraventricular tachycardia, unspecified: Secondary | ICD-10-CM | POA: Diagnosis not present

## 2024-05-15 DIAGNOSIS — I071 Rheumatic tricuspid insufficiency: Secondary | ICD-10-CM

## 2024-05-15 DIAGNOSIS — I2510 Atherosclerotic heart disease of native coronary artery without angina pectoris: Secondary | ICD-10-CM | POA: Diagnosis not present

## 2024-05-15 DIAGNOSIS — I340 Nonrheumatic mitral (valve) insufficiency: Secondary | ICD-10-CM | POA: Diagnosis not present

## 2024-05-15 NOTE — Patient Instructions (Addendum)
 Medication Instructions:  No changes *If you need a refill on your cardiac medications before your next appointment, please call your pharmacy*  Lab Work: FASTING Lipid Panel and Lipoprotein A to be done at PCP's office; they can fax to [phone] if needed  Testing/Procedures: Your physician has requested that you have an echocardiogram to be done mid July 2026 before returning for a one year follow up visit. Echocardiography is a painless test that uses sound waves to create images of your heart. It provides your doctor with information about the size and shape of your heart and how well your heart's chambers and valves are working. This procedure takes approximately one hour. There are no restrictions for this procedure. Please do NOT wear cologne, perfume, aftershave, or lotions (deodorant is allowed). Please arrive 15 minutes prior to your appointment time.  Please note: We ask at that you not bring children with you during ultrasound (echo/ vascular) testing. Due to room size and safety concerns, children are not allowed in the ultrasound rooms during exams. Our front office staff cannot provide observation of children in our lobby area while testing is being conducted. An adult accompanying a patient to their appointment will only be allowed in the ultrasound room at the discretion of the ultrasound technician under special circumstances. We apologize for any inconvenience.   Follow-Up: At [facility], you and your health needs are our priority.  As part of our continuing mission to provide you with exceptional heart care, our providers are all part of one team.  This team includes your primary Cardiologist (physician) and Advanced Practice Providers or APPs (Physician Assistants and Nurse Practitioners) who all work together to provide you with the care you need, when you need it.  Your next appointment:   1 year(s)  Provider:   Gayatri A Acharya, MD     Other  Instructions Please call us  or send a MyChart message with any Cardiology related questions/concerns.  [phone].  Thank you!

## 2024-05-15 NOTE — Progress Notes (Signed)
 Cardiology Office Note:  .   Date:  05/15/2024  ID:  Wanda Robertson, DOB 11/28/1955, MRN [id number] PCP: Catherine Charlies LABOR, DO  [facility] Providers Cardiologist:  Soyla LABOR Merck, MD    History of Present Illness: .   Wanda Robertson is a 68 y.o. female.  Discussed the use of AI scribe software for clinical note transcription with the patient, who gave verbal consent to proceed.  History of Present Illness Wanda Robertson is a 68 year old female with coronary artery calcifications and paroxysmal supraventricular tachycardia who presents for follow-up.  She has a calcium  score of 503 with no significant changes in her coronary artery disease over the past year. She experiences no chest pain or dyspnea. Her cholesterol is well-managed with Repatha , achieving an LDL of 43. A follow-up echocardiogram last year showed mild mitral regurgitation and mild to moderate tricuspid regurgitation.  She experiences occasional nocturnal palpitations, likely consistent with her paroxysmal supraventricular tachycardia, with no significant change in frequency or duration over the past year. She occasionally uses Xanax  or Ambien  for sleep issues.  Her medical history includes hyperlipidemia, statin intolerance, and hypothyroidism, managed with Synthroid  25 micrograms. She previously stopped aspirin  due to minor purpura and now takes it every other day. She denies any issues with Repatha  and has not experienced any side effects.  She follows a vegan diet and has a strong family history of early heart disease. 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 She does not snore unless sleeping on her back and has no known sleep apnea.    ROS: negative except per HPI above.  Studies Reviewed: SABRA   EKG Interpretation Date/Time:  Thursday May 15 2024 15:00:44 EDT Ventricular Rate:  93 PR Interval:    QRS Duration:  130 QT  Interval:  400 QTC Calculation: 497 R Axis:   47  Text Interpretation: Ectopic atrial rhythm Nonspecific T wave abnormality No significant change since last tracing Confirmed by Merck Soyla ([id number]) on 05/15/2024 3:58:06 PM    Results LABS LDL: 43 mg/dL (93/7975) Electrolyte Panel: Within normal limits (03/2023) Liver Function Tests: Within normal limits (03/2023)  RADIOLOGY Calcium  Score: 503  DIAGNOSTIC Echocardiogram: Mild mitral regurgitation, mild to moderate tricuspid regurgitation (03/2023) EKG: Probable ectopic atrial rhythm Risk Assessment/Calculations:       Physical Exam:   VS:  BP 138/89   Pulse 93   Ht 5' 4 (1.626 m)   BMI 19.57 kg/m    Wt Readings from Last 3 Encounters:  04/16/24 114 lb (51.7 kg)  04/07/24 114 lb 3.2 oz (51.8 kg)  05/18/23 118 lb 6.4 oz (53.7 kg)     Physical Exam GENERAL: Alert, cooperative, well developed, no acute distress HEENT: Normocephalic, normal oropharynx, moist mucous membranes CHEST: Clear to auscultation bilaterally, No wheezes, rhonchi, or crackles CARDIOVASCULAR: Normal heart rate and rhythm, S1 and S2 normal without murmurs, Carotid arteries normal ABDOMEN: Soft, non-tender, non-distended, without organomegaly, Normal bowel sounds EXTREMITIES: No cyanosis or edema NEUROLOGICAL: Cranial nerves grossly intact, Moves all extremities without gross motor or sensory deficit   ASSESSMENT AND PLAN: .    Assessment and Plan Assessment & Plan Coronary artery disease with elevated coronary calcium  score Calcium  score of 503 indicates high risk. No angina or dyspnea. Repatha  effectively manages cholesterol with LDL at 43. Strong family history of early heart disease. Current therapy appropriate. - Order fasting lipid panel at primary care provider's office. - Consider  stress test or coronary CTA if angina or dyspnea develop. - Continue Repatha  therapy.  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 EKG shows probable ectopic atrial rhythm, consistent with previous findings. No symptoms warranting intervention. - Monitor for changes in symptoms or frequency of palpitations.  Mitral and tricuspid regurgitation Mild mitral and mild to moderate tricuspid regurgitation noted on last echocardiogram. No symptoms like dyspnea or orthopnea. - Repeat echocardiogram in 2026. - Monitor for symptoms such as progressive dyspnea or orthopnea.  Hyperlipidemia on PCSK9 inhibitor therapy Hyperlipidemia well-controlled with Repatha . LDL at 43, optimal for prevention. No adverse effects. Therapy affects soft plaque, not calcium  score. - Continue Repatha  therapy. - Order fasting lipid panel at primary care provider's office.      Soyla Merck, MD, FACC

## 2024-05-18 ENCOUNTER — Encounter: Payer: Self-pay | Admitting: Family Medicine

## 2024-05-22 DIAGNOSIS — K080 Exfoliation of teeth due to systemic causes: Secondary | ICD-10-CM | POA: Diagnosis not present

## 2024-05-27 DIAGNOSIS — I471 Supraventricular tachycardia, unspecified: Secondary | ICD-10-CM | POA: Diagnosis not present

## 2024-05-27 DIAGNOSIS — E782 Mixed hyperlipidemia: Secondary | ICD-10-CM | POA: Diagnosis not present

## 2024-05-27 DIAGNOSIS — I340 Nonrheumatic mitral (valve) insufficiency: Secondary | ICD-10-CM | POA: Diagnosis not present

## 2024-05-28 LAB — LIPID PANEL
Chol/HDL Ratio: 2.1 ratio (ref 0.0–4.4)
Cholesterol, Total: 108 mg/dL (ref 100–199)
HDL: 51 mg/dL (ref >39)
LDL Chol Calc (NIH): 44 mg/dL (ref 0–99)
Triglycerides: 59 mg/dL (ref 0–149)
VLDL Cholesterol Cal: 13 mg/dL (ref 5–40)

## 2024-05-28 LAB — LIPOPROTEIN A (LPA): Lipoprotein (a): <8.4 nmol/L (ref <75.0)

## 2024-05-29 ENCOUNTER — Ambulatory Visit: Payer: Self-pay | Admitting: Internal Medicine

## 2024-06-04 DIAGNOSIS — R1010 Upper abdominal pain, unspecified: Secondary | ICD-10-CM | POA: Diagnosis not present

## 2024-06-04 DIAGNOSIS — K635 Polyp of colon: Secondary | ICD-10-CM | POA: Diagnosis not present

## 2024-06-04 DIAGNOSIS — Z800 Family history of malignant neoplasm of digestive organs: Secondary | ICD-10-CM | POA: Diagnosis not present

## 2024-06-05 DIAGNOSIS — M47812 Spondylosis without myelopathy or radiculopathy, cervical region: Secondary | ICD-10-CM | POA: Diagnosis not present

## 2024-06-05 DIAGNOSIS — Z79891 Long term (current) use of opiate analgesic: Secondary | ICD-10-CM | POA: Diagnosis not present

## 2024-06-05 DIAGNOSIS — M797 Fibromyalgia: Secondary | ICD-10-CM | POA: Diagnosis not present

## 2024-06-05 DIAGNOSIS — G894 Chronic pain syndrome: Secondary | ICD-10-CM | POA: Diagnosis not present

## 2024-07-03 DIAGNOSIS — D126 Benign neoplasm of colon, unspecified: Secondary | ICD-10-CM | POA: Diagnosis not present

## 2024-07-03 DIAGNOSIS — Z860101 Personal history of adenomatous and serrated colon polyps: Secondary | ICD-10-CM | POA: Diagnosis not present

## 2024-07-03 DIAGNOSIS — D127 Benign neoplasm of rectosigmoid junction: Secondary | ICD-10-CM | POA: Diagnosis not present

## 2024-07-03 DIAGNOSIS — Z800 Family history of malignant neoplasm of digestive organs: Secondary | ICD-10-CM | POA: Diagnosis not present

## 2024-07-03 DIAGNOSIS — Z9889 Other specified postprocedural states: Secondary | ICD-10-CM | POA: Diagnosis not present

## 2024-07-03 DIAGNOSIS — Z09 Encounter for follow-up examination after completed treatment for conditions other than malignant neoplasm: Secondary | ICD-10-CM | POA: Diagnosis not present

## 2024-07-03 LAB — HM COLONOSCOPY

## 2024-07-31 DIAGNOSIS — M47812 Spondylosis without myelopathy or radiculopathy, cervical region: Secondary | ICD-10-CM | POA: Diagnosis not present

## 2024-07-31 DIAGNOSIS — G894 Chronic pain syndrome: Secondary | ICD-10-CM | POA: Diagnosis not present

## 2024-07-31 DIAGNOSIS — M797 Fibromyalgia: Secondary | ICD-10-CM | POA: Diagnosis not present

## 2024-07-31 DIAGNOSIS — Z79891 Long term (current) use of opiate analgesic: Secondary | ICD-10-CM | POA: Diagnosis not present

## 2024-08-12 ENCOUNTER — Telehealth: Payer: Self-pay

## 2024-08-12 NOTE — Telephone Encounter (Signed)
 Received fax from [facility] for statin therapy and why she is unable to do so. Diagnosis and signature needed. Please advise if or what office notes are needed. Form placed in pcp basket

## 2024-08-12 NOTE — Telephone Encounter (Signed)
 The reasoning for receiving that fax is because cardiology has prescribed Repatha .  Please fax form to cardiology

## 2024-08-12 NOTE — Telephone Encounter (Signed)
 Form faxed to pts cardiologist.

## 2024-08-29 ENCOUNTER — Ambulatory Visit: Admitting: Family Medicine

## 2024-08-29 ENCOUNTER — Encounter: Payer: Self-pay | Admitting: Family Medicine

## 2024-08-29 VITALS — BP 128/82 | HR 85 | Temp 98.3°F | Wt 115.8 lb

## 2024-08-29 DIAGNOSIS — B9689 Other specified bacterial agents as the cause of diseases classified elsewhere: Secondary | ICD-10-CM

## 2024-08-29 DIAGNOSIS — J329 Chronic sinusitis, unspecified: Secondary | ICD-10-CM

## 2024-08-29 MED ORDER — METHYLPREDNISOLONE ACETATE 80 MG/ML IJ SUSP
80.0000 mg | Freq: Once | INTRAMUSCULAR | Status: AC
  Administered 2024-08-29: 80 mg via INTRAMUSCULAR

## 2024-08-29 MED ORDER — DOXYCYCLINE HYCLATE 100 MG PO TABS: 100.0000 mg | ORAL_TABLET | Freq: Two times a day (BID) | ORAL | 0 refills | Status: AC

## 2024-08-29 NOTE — Patient Instructions (Signed)

## 2024-08-29 NOTE — Progress Notes (Signed)
 Wanda Robertson , May 25, 1956, 68 y.o., female MRN: [id number] Patient Care Team    Relationship Specialty Notifications Start End  Catherine Charlies LABOR, DO PCP - General Family Medicine  07/16/20   Loni Soyla LABOR, MD PCP - Cardiology Cardiology  05/15/24   Shila Gustav GAILS, MD Consulting Physician Gastroenterology  07/21/20   Cammie Batters, OD Referring Physician Optometry  07/21/20   Dannielle Bouchard, DO Consulting Physician Obstetrics and Gynecology  07/21/20   Orlando Anes, MD  Anesthesiology  07/21/20    Comment: pain management    Chief Complaint  Patient presents with   Nasal Congestion    Since Sunday; nasal congestion, watery eyes. Pt has tried Allegra and flonase.      Subjective: Wanda Robertson is a 68 y.o. Pt presents for an OV with complaints of nasal congestion and watery eyes of 5 days duration.  Patient reports symptoms started after she was working out from her current on Sunday. She reports a great deal of sinus pressure surrounding eyes.  Pt has tried Allegra and Flonase to ease their symptoms.  (See ROS)     04/16/2024   10:35 AM 04/07/2024    1:56 PM 05/09/2023   10:16 AM 03/22/2023    9:38 AM 02/07/2023    3:44 PM  Depression screen PHQ 2/9  Decreased Interest 0 0 0 0 0  Down, Depressed, Hopeless 0 0 0 0 0  PHQ - 2 Score 0 0 0 0 0  Altered sleeping 0 0     Tired, decreased energy 2 2     Change in appetite 0 0     Feeling bad or failure about yourself  0 0     Trouble concentrating 0 0     Moving slowly or fidgety/restless 0 0     Suicidal thoughts 0 0     PHQ-9 Score 2  2      Difficult doing work/chores Not difficult at all Not difficult at all        Data saved with a previous flowsheet row definition    Allergies  Allergen Reactions   Compazine [Prochlorperazine Edisylate] Other (See Comments)    Severe muscle requiring ER visit   Nsaids Nausea And Vomiting and Nausea Only   Penicillins Hives   Prochlorperazine     Sulfamethoxazole-Trimethoprim    Sulfa Antibiotics Rash   Social History   Social History Narrative   Marital status/children/pets: Married. One child.   Education/employment: Works as a Careers Adviser:      -Wears a bicycle helmet riding a bike: Yes     -smoke alarm in the home:Yes     - wears seatbelt: Yes     - Feels safe in their relationships: Yes   Past Medical History:  Diagnosis Date   Abdominal hernia 04/01/2020   Abnormal cervical Papanicolaou smear 04/01/2020   Chicken pox    Chronic back pain    Chronic neck pain    Family history of heart disease 02/25/2014   Fibromyalgia 05/15/2013   GERD (gastroesophageal reflux disease) 05/15/2013   Heart murmur    Hyperlipemia 05/15/2013   Hypothyroidism 05/15/2013   IBS (irritable bowel syndrome)    Osteopenia    Osteoporosis 05/15/2013   History of past treatment with Forteo    Raynaud's disease 02/25/2014   Past Surgical History:  Procedure Laterality Date   COLONOSCOPY     TONSILLECTOMY     Family History  Problem  Relation Age of Onset   Heart disease Mother    Arthritis Mother    Early death Mother 39   Heart attack Mother    Heart disease Father    Colon cancer Father    Hyperlipidemia Father    Hypertension Father    Heart disease Other        family history    Brain cancer Sister    Early death Sister 63   Liver cancer Brother    Early death Brother 57   Kidney disease Paternal Grandmother    Hypertension Paternal Grandfather    Heart disease Brother    Heart attack Brother    Esophageal cancer Neg Hx    Pancreatic cancer Neg Hx    Stomach cancer Neg Hx    Allergies as of 08/29/2024       Reactions   Compazine [prochlorperazine Edisylate] Other (See Comments)   Severe muscle requiring ER visit   Nsaids Nausea And Vomiting, Nausea Only   Penicillins Hives   Prochlorperazine    Sulfamethoxazole-trimethoprim    Sulfa Antibiotics Rash        Medication List        Accurate as of  August 29, 2024 11:36 AM. If you have any questions, ask your nurse or doctor.          ALPRAZolam  0.5 MG tablet Commonly known as: XANAX  Take 1 tablet (0.5 mg total) by mouth at bedtime as needed.   Biotin 1000 MCG tablet Take 1,000 mcg by mouth daily.   CO Q 10 PO Take 1 tablet by mouth daily.   doxycycline 100 MG tablet Commonly known as: VIBRA-TABS Take 1 tablet (100 mg total) by mouth 2 (two) times daily for 7 days. Started by: Aliayah Tyer   Imvexxy Starter Pack 4 MCG Inst Generic drug: Estradiol Starter Pack   KRILL OIL PO Take 1 tablet by mouth daily.   levothyroxine  25 MCG tablet Commonly known as: SYNTHROID  Take 1 tablet (25 mcg total) by mouth daily.   MS Contin 30 MG 12 hr tablet Generic drug: morphine Take 30 mg by mouth every 8 (eight) hours.   Nitro-Bid  2 % ointment Generic drug: nitroGLYCERIN  Apply 0.5 inches topically 2 (two) times daily as needed for chest pain (raynauds).   Repatha  SureClick 140 MG/ML Soaj Generic drug: Evolocumab  Inject 140 mg into the skin every 14 (fourteen) days.   traMADol 50 MG tablet Commonly known as: ULTRAM 1 tablet as needed Orally Once a day   TURMERIC PO Take 1,000 mg by mouth.   valACYclovir 500 MG tablet Commonly known as: VALTREX Take 500 mg by mouth daily.   VITAMIN B 12 PO Take 1 tablet by mouth daily.   VITAMIN K2-VITAMIN D3 PO Take by mouth.   zolpidem  12.5 MG CR tablet Commonly known as: AMBIEN  CR Take 1 tablet (12.5 mg total) by mouth at bedtime as needed for sleep.        All past medical history, surgical history, allergies, family history, immunizations andmedications were updated in the EMR today and reviewed under the history and medication portions of their EMR.     Review of Systems  Constitutional:  Positive for malaise/fatigue. Negative for chills and fever.  HENT:  Positive for congestion, sinus pain and sore throat. Negative for ear pain.   Respiratory:  Negative for cough,  sputum production, shortness of breath and wheezing.   Cardiovascular: Negative.   Gastrointestinal:  Negative for diarrhea, nausea and vomiting.  Musculoskeletal:  Negative for myalgias.  Neurological:  Positive for headaches. Negative for dizziness.   Negative, with the exception of above mentioned in HPI   Objective:  BP 128/82   Pulse 85   Temp 98.3 F (36.8 C)   Wt 115 lb 12.8 oz (52.5 kg)   SpO2 97%   BMI 19.88 kg/m  Body mass index is 19.88 kg/m. Physical Exam Vitals and nursing note reviewed.  Constitutional:      General: She is not in acute distress.    Appearance: Normal appearance. She is normal weight. She is not ill-appearing or toxic-appearing.  HENT:     Head: Normocephalic and atraumatic.     Right Ear: Tympanic membrane, ear canal and external ear normal. Tympanic membrane is not erythematous or bulging.     Left Ear: Tympanic membrane, ear canal and external ear normal. Tympanic membrane is not erythematous or bulging.     Nose: Congestion and rhinorrhea present.     Right Sinus: Maxillary sinus tenderness and frontal sinus tenderness present.     Left Sinus: Maxillary sinus tenderness and frontal sinus tenderness present.     Mouth/Throat:     Lips: No lesions.     Mouth: Mucous membranes are moist. No oral lesions.     Tongue: No lesions.     Pharynx: Postnasal drip present. No oropharyngeal exudate or posterior oropharyngeal erythema.  Eyes:     General: No scleral icterus.       Right eye: No discharge.        Left eye: No discharge.     Extraocular Movements: Extraocular movements intact.     Conjunctiva/sclera: Conjunctivae normal.     Pupils: Pupils are equal, round, and reactive to light.  Cardiovascular:     Rate and Rhythm: Normal rate and regular rhythm.     Heart sounds: No murmur heard. Pulmonary:     Effort: Pulmonary effort is normal. No respiratory distress.     Breath sounds: Normal breath sounds. No wheezing, rhonchi or rales.   Musculoskeletal:     Cervical back: Neck supple.  Lymphadenopathy:     Cervical: No cervical adenopathy.  Skin:    Findings: No rash.  Neurological:     Mental Status: She is alert and oriented to person, place, and time. Mental status is at baseline.     Motor: No weakness.     Coordination: Coordination normal.     Gait: Gait normal.  Psychiatric:        Mood and Affect: Mood normal.        Behavior: Behavior normal.        Thought Content: Thought content normal.        Judgment: Judgment normal.     No results found. No results found. No results found for this or any previous visit (from the past 24 hours).  Assessment/Plan: Jaquetta Currier is a 68 y.o. female present for OV for  Bacterial sinusitis (Primary) Rest, hydrate.  +/- flonase, mucinex (DM if cough), nettie pot or nasal saline.  Continue allegra Doxy bid (if needed)  prescribed, take until completed.  IM DEPO medrol  80 provided today F/U 2 weeks if not improved.    Reviewed expectations re: course of current medical issues. Discussed self-management of symptoms. Outlined signs and symptoms indicating need for more acute intervention. Patient verbalized understanding and all questions were answered. Patient received an After-Visit Summary.    No orders of the defined types were placed in this encounter.  Meds ordered this encounter  Medications   doxycycline (VIBRA-TABS) 100 MG tablet    Sig: Take 1 tablet (100 mg total) by mouth 2 (two) times daily for 7 days.    Dispense:  14 tablet    Refill:  0   Referral Orders  No referral(s) requested today     Note is dictated utilizing voice recognition software. Although note has been proof read prior to signing, occasional typographical errors still can be missed. If any questions arise, please do not hesitate to call for verification.   electronically signed by:  Charlies Bellini, DO  [facility] [place]

## 2024-09-02 ENCOUNTER — Encounter: Payer: Self-pay | Admitting: Family Medicine

## 2024-09-03 MED ORDER — PREDNISONE 20 MG PO TABS: 40.0000 mg | ORAL_TABLET | Freq: Every day | ORAL | 0 refills | Status: DC

## 2024-09-17 ENCOUNTER — Telehealth: Payer: Self-pay

## 2024-09-17 NOTE — Telephone Encounter (Signed)
 Pt had AWV completed on 04/16/24. Insurance is asking for G72.0 to be added to the claim. Please advise

## 2024-09-17 NOTE — Telephone Encounter (Signed)
 Copied from CRM [id number]. Topic: General - Other >> Sep 16, 2024 11:24 AM Franky GRADE wrote: Reason for CRM: Chyrl Pharmacist with [facility] cross is calling regarding a  Claims adjustment form that was sent to the office on 08/11/2024 that seems to have been sent to the patient's cardiologist and signed off;however, due to appointment that the form is for the form needs to be signed by Charlies Bellini date of service 04/16/2024. He will be faxing the forms again today and would like to know if there is any way Charlies can fill out the forms with add diagnosis code  G72.0. His best call back number is [phone] ext [phone] incase there are any questions or concerns.

## 2024-09-18 NOTE — Telephone Encounter (Signed)
 Please return their call. why are they asking the primary doctor to complete this form?  And add on this diagnosis?   It would not be appropriate for me to add on a diagnosis to the Medicare wellness appointment, which was completed 04/16/2024 by the health coach, as they are asking me to do.  That office visit was strictly Medicare wellness/preventative care and was not a problem-based visit with a provider.  The cardiology team is prescribing her Repatha  and managing her cholesterol.

## 2024-09-18 NOTE — Telephone Encounter (Signed)
 Called and spoke with Wanda Robertson. Explained the encounter the dx code was needing to be added to was for an AWV, not an OV with the provider. Therefore the provider could not add the dx. Wanda Robertson stated he understood and stated there was nothing further needed from our office.

## 2024-10-01 DIAGNOSIS — Z79891 Long term (current) use of opiate analgesic: Secondary | ICD-10-CM | POA: Diagnosis not present

## 2024-10-01 DIAGNOSIS — M797 Fibromyalgia: Secondary | ICD-10-CM | POA: Diagnosis not present

## 2024-10-01 DIAGNOSIS — M47812 Spondylosis without myelopathy or radiculopathy, cervical region: Secondary | ICD-10-CM | POA: Diagnosis not present

## 2024-10-01 DIAGNOSIS — G894 Chronic pain syndrome: Secondary | ICD-10-CM | POA: Diagnosis not present

## 2024-10-17 ENCOUNTER — Encounter: Payer: Self-pay | Admitting: Internal Medicine

## 2024-10-18 ENCOUNTER — Other Ambulatory Visit: Payer: Self-pay | Admitting: Pharmacist

## 2024-10-20 ENCOUNTER — Ambulatory Visit (INDEPENDENT_AMBULATORY_CARE_PROVIDER_SITE_OTHER): Admitting: Family Medicine

## 2024-10-20 ENCOUNTER — Encounter: Payer: Self-pay | Admitting: Family Medicine

## 2024-10-20 ENCOUNTER — Encounter: Payer: Self-pay | Admitting: Internal Medicine

## 2024-10-20 VITALS — BP 122/72 | HR 88 | Temp 98.1°F | Wt 117.4 lb

## 2024-10-20 DIAGNOSIS — S76301A Unspecified injury of muscle, fascia and tendon of the posterior muscle group at thigh level, right thigh, initial encounter: Secondary | ICD-10-CM | POA: Diagnosis not present

## 2024-10-20 DIAGNOSIS — M542 Cervicalgia: Secondary | ICD-10-CM

## 2024-10-20 DIAGNOSIS — M549 Dorsalgia, unspecified: Secondary | ICD-10-CM

## 2024-10-20 DIAGNOSIS — G8929 Other chronic pain: Secondary | ICD-10-CM | POA: Diagnosis not present

## 2024-10-20 DIAGNOSIS — E063 Autoimmune thyroiditis: Secondary | ICD-10-CM

## 2024-10-20 DIAGNOSIS — F5101 Primary insomnia: Secondary | ICD-10-CM | POA: Diagnosis not present

## 2024-10-20 DIAGNOSIS — E782 Mixed hyperlipidemia: Secondary | ICD-10-CM | POA: Diagnosis not present

## 2024-10-20 MED ORDER — ALPRAZOLAM 0.5 MG PO TABS: 0.5000 mg | ORAL_TABLET | Freq: Two times a day (BID) | ORAL | 1 refills | Status: AC | PRN

## 2024-10-20 MED ORDER — ZOLPIDEM TARTRATE ER 12.5 MG PO TBCR: 12.5000 mg | EXTENDED_RELEASE_TABLET | Freq: Every evening | ORAL | 1 refills | Status: AC | PRN

## 2024-10-20 NOTE — Progress Notes (Addendum)
 "   Patient ID: Wanda Robertson, female  DOB: 01-17-1956, 69 y.o.   MRN: [id number] Patient Care Team    Relationship Specialty Notifications Start End  Wanda Robertson LABOR, DO PCP - General Family Medicine  07/16/20   Wanda Gustav GAILS, MD Consulting Physician Gastroenterology  07/21/20   Cammie Batters, OD Referring Physician Optometry  07/21/20   Dannielle Bouchard, DO Consulting Physician Obstetrics and Gynecology  07/21/20   Orlando Anes, MD  Anesthesiology  07/21/20    Comment: pain management  Kriss Estefana DEL, DO Consulting Physician Gastroenterology  10/20/24   Loni Soyla LABOR, MD Consulting Physician Cardiology  10/20/24     Chief Complaint  Patient presents with   Insomnia    Subjective: Wanda Robertson is a 69 y.o.  female present for chronic condition management.  All past medical history was reviewed and updated if appropriate. Medication reconciliation completed today  Primary insomnia Patient reports compliance with Ambien  12.5 mg nightly and it is working very well for her insomnia.  Occasionally she will use the Xanax  0.5 mg nightly if she is unable to fall asleep only, she does endorsed mildly increased anxiety during if she can have Xanax  available to her if needed during the day.  She denies complications or oversedation Prior note: Patient reports she has been prescribed Ambien  12.5 mg nightly as needed for insomnia for many years. She also is prescribed Xanax  0.5 mg nightly as needed for insomnia. She states she does not take both of these at the same time typically. Depending upon her sleep pattern. She is prescribed tramadol and morphine through her pain specialist. She feels she is doing much better. She states they are aware of her Ambien  and benzodiazepine use. She denies any sedation with these medications.  Chronic neck pain/Chronic back pain, unspecified back location, unspecified back pain laterality/Cervicalgia Patient is prescribed morphine and tramadol  through her pain management specialist.  Positive Cologuard 04/14/2024> colonoscopy 07/04/2024 Dr. [facility]> lg serrated adenoma. 5 yr follow-up per pt  Right hamstring injury: Patient reports she has noticed her right hamstring is causing her discomfort and she is unable to exercise as she used to do without feeling like she is straining her hamstring.     10/20/2024   11:24 AM 04/16/2024   10:35 AM 04/07/2024    1:56 PM 05/09/2023   10:16 AM 03/22/2023    9:38 AM  Depression screen PHQ 2/9  Decreased Interest 0 0 0 0 0  Down, Depressed, Hopeless 0 0 0 0 0  PHQ - 2 Score 0 0 0 0 0  Altered sleeping 1 0 0    Tired, decreased energy 2 2 2     Change in appetite 0 0 0    Feeling bad or failure about yourself  0 0 0    Trouble concentrating 0 0 0    Moving slowly or fidgety/restless 0 0 0    Suicidal thoughts 0 0 0    PHQ-9 Score 3 2  2      Difficult doing work/chores Not difficult at all Not difficult at all Not difficult at all       Data saved with a previous flowsheet row definition      10/20/2024   11:24 AM 06/12/2022    2:28 PM 12/28/2021    1:48 PM 06/15/2021    1:53 PM  GAD 7 : Generalized Anxiety Score  Nervous, Anxious, on Edge 0 0 0 1  Control/stop worrying 0 0  0 0  Worry too much - different things 0 0 0 0  Trouble relaxing 1 1 1  0  Restless 0 0 0 0  Easily annoyed or irritable 0 0 0 0  Afraid - awful might happen 0 0 0 0  Total GAD 7 Score 1 1 1 1   Anxiety Difficulty Not difficult at all              10/20/2024   11:24 AM 04/16/2024   10:27 AM 04/07/2024    1:56 PM 05/09/2023   10:18 AM 03/22/2023    9:38 AM  Fall Risk   Falls in the past year? 0 0 0 0 0  Number falls in past yr: 0 0 0 0 0  Injury with Fall? 0 0  0  0  0   Risk for fall due to : No Fall Risks  No Fall Risks Impaired vision No Fall Risks  Follow up Falls evaluation completed Falls evaluation completed;Education provided;Falls prevention discussed Falls evaluation completed Falls prevention  discussed Falls evaluation completed     Data saved with a previous flowsheet row definition     Immunization History  Administered Date(s) Administered   Janssen (J&J) SARS-COV-2 Vaccination 01/16/2020   Tdap 10/16/2013   Zoster Recombinant(Shingrix) 10/05/2019, 07/16/2020   Zoster, Live 10/05/2019    No results found.  Past Medical History:  Diagnosis Date   Abdominal hernia 04/01/2020   Abnormal cervical Papanicolaou smear 04/01/2020   Chicken pox    Chronic back pain    Chronic neck pain    Family history of heart disease 02/25/2014   Fibromyalgia 05/15/2013   GERD (gastroesophageal reflux disease) 05/15/2013   Heart murmur    Hyperlipemia 05/15/2013   Hypothyroidism 05/15/2013   IBS (irritable bowel syndrome)    Osteopenia    Osteoporosis 05/15/2013   History of past treatment with Forteo    Raynaud's disease 02/25/2014   Allergies  Allergen Reactions   Compazine [Prochlorperazine Edisylate] Other (See Comments)    Severe muscle requiring ER visit   Nsaids Nausea And Vomiting and Nausea Only   Penicillins Hives   Prochlorperazine    Sulfamethoxazole-Trimethoprim    Sulfa Antibiotics Rash   Past Surgical History:  Procedure Laterality Date   COLONOSCOPY     TONSILLECTOMY     Family History  Problem Relation Age of Onset   Heart disease Mother    Arthritis Mother    Early death Mother 29   Heart attack Mother    Heart disease Father    Colon cancer Father    Hyperlipidemia Father    Hypertension Father    Heart disease Other        family history    Brain cancer Sister    Early death Sister 25   Liver cancer Brother    Early death Brother 31   Kidney disease Paternal Grandmother    Hypertension Paternal Grandfather    Heart disease Brother    Heart attack Brother    Esophageal cancer Neg Hx    Pancreatic cancer Neg Hx    Stomach cancer Neg Hx    Social History   Social History Narrative   Marital status/children/pets: Married. One child.    Education/employment: Works as a Careers Adviser:      -Wears a bicycle helmet riding a bike: Yes     -smoke alarm in the home:Yes     - wears seatbelt: Yes     - Feels safe  in their relationships: Yes    Allergies as of 10/20/2024       Reactions   Compazine [prochlorperazine Edisylate] Other (See Comments)   Severe muscle requiring ER visit   Nsaids Nausea And Vomiting, Nausea Only   Penicillins Hives   Prochlorperazine    Sulfamethoxazole-trimethoprim    Sulfa Antibiotics Rash        Medication List        Accurate as of October 20, 2024 11:54 AM. If you have any questions, ask your nurse or doctor.          STOP taking these medications    Imvexxy Starter Pack 4 MCG Inst Generic drug: Estradiol Starter Pack Stopped by: Robertson Bellini, DO   predniSONE  20 MG tablet Commonly known as: DELTASONE  Stopped by: Robertson Bellini, DO   traMADol 50 MG tablet Commonly known as: ULTRAM Stopped by: Taras Rask, DO       TAKE these medications    ALPRAZolam  0.5 MG tablet Commonly known as: XANAX  Take 1 tablet (0.5 mg total) by mouth 2 (two) times daily as needed. What changed: when to take this Changed by: Robertson Bellini, DO   Biotin 1000 MCG tablet Take 1,000 mcg by mouth daily.   clobetasol  ointment 0.05 % Commonly known as: TEMOVATE  1 Application.   CO Q 10 PO Take 1 tablet by mouth daily.   estradiol 0.075 MG/24HR Commonly known as: VIVELLE-DOT Apply 1 patch twice a week by transdermal route as directed.   KRILL OIL PO Take 1 tablet by mouth daily.   levothyroxine  25 MCG tablet Commonly known as: SYNTHROID  Take 1 tablet (25 mcg total) by mouth daily.   metroNIDAZOLE 0.75 % vaginal gel Commonly known as: METROGEL Place vaginally.   MS Contin 30 MG 12 hr tablet Generic drug: morphine Take 30 mg by mouth every 8 (eight) hours. What changed: Another medication with the same name was removed. Continue taking this medication, and follow the  directions you see here. Changed by: Robertson Bellini, DO   Nitro-Bid  2 % ointment Generic drug: nitroGLYCERIN  Apply 0.5 inches topically 2 (two) times daily as needed for chest pain (raynauds).   Repatha  SureClick 140 MG/ML Soaj Generic drug: Evolocumab  INJECT 140 MG INTO THE SKIN EVERY 14 DAYS   terbinafine  250 MG tablet Commonly known as: LAMISIL  Take 250 mg by mouth daily.   TURMERIC PO Take 1,000 mg by mouth.   valACYclovir 500 MG tablet Commonly known as: VALTREX Take 500 mg by mouth daily.   VITAMIN B 12 PO Take 1 tablet by mouth daily.   VITAMIN K2-VITAMIN D3 PO Take by mouth.   zolpidem  12.5 MG CR tablet Commonly known as: AMBIEN  CR Take 1 tablet (12.5 mg total) by mouth at bedtime as needed for sleep.        All past medical history, surgical history, allergies, family history, immunizations andmedications were updated in the EMR today and reviewed under the history and medication portions of their EMR.      ROS: 14 pt review of systems performed and negative (unless mentioned in an HPI)  Objective: BP 122/72   Pulse 88   Temp 98.1 F (36.7 C)   Wt 117 lb 6.4 oz (53.3 kg)   SpO2 98%   BMI 20.15 kg/m  Physical Exam Vitals and nursing note reviewed.  Constitutional:      General: She is not in acute distress.    Appearance: Normal appearance. She is not toxic-appearing.  HENT:  Head: Normocephalic and atraumatic.  Eyes:     General: No scleral icterus.       Right eye: No discharge.        Left eye: No discharge.     Conjunctiva/sclera: Conjunctivae normal.  Cardiovascular:     Rate and Rhythm: Normal rate and regular rhythm.     Heart sounds: No murmur heard. Pulmonary:     Effort: Pulmonary effort is normal.     Breath sounds: Normal breath sounds.  Musculoskeletal:     Cervical back: Normal range of motion.     Right lower leg: No edema.     Left lower leg: No edema.  Skin:    Findings: No rash.  Neurological:     Mental Status: She  is alert and oriented to person, place, and time. Mental status is at baseline.  Psychiatric:        Mood and Affect: Mood normal.        Behavior: Behavior normal.        Thought Content: Thought content normal.        Judgment: Judgment normal.    Assessment/plan: Shagun Wordell is a 69 y.o. female present for  Primary insomnia Stable -  She is on multiple sedating medications. She denies any sedation and reports her pain management team are aware of her Ambien  and her benzodiazepine use. She reports she typically does not use the benzodiazepine and the Ambien  at the same time. Continue Xanax  0.5 mg twice daily as needed Continue zolpidem  (AMBIEN  CR) 12.5 MG CR tablet [place]  controlled substance database was reviewed 10/20/2024 UDS collected today  Hypothyroidism/fatigue: Continue levo 25 mcg qd.  Labs UTD  Chronic neck pain/Chronic back pain, unspecified back location, unspecified back pain laterality/Cervicalgia Patient is prescribed tramadol and Kadian through her pain management team.  Mixed hyperlipidemia Repatha  by cardio  Raynaud's disease without gangrene Managed by rheumatology  Right hamstring injury: Referred to physical therapy Return in about 24 weeks (around 04/06/2025) for cpe (20 min), Routine chronic condition follow-up.  Orders Placed This Encounter  Procedures   Drug Monitoring Panel [phone] , Urine   Ambulatory referral to Physical Therapy   HM COLONOSCOPY    Meds ordered this encounter  Medications   ALPRAZolam  (XANAX ) 0.5 MG tablet    Sig: Take 1 tablet (0.5 mg total) by mouth 2 (two) times daily as needed.    Dispense:  135 tablet    Refill:  1   zolpidem  (AMBIEN  CR) 12.5 MG CR tablet    Sig: Take 1 tablet (12.5 mg total) by mouth at bedtime as needed for sleep.    Dispense:  90 tablet    Refill:  1    Referral Orders         Ambulatory referral to Physical Therapy       Note is dictated utilizing voice recognition software.  Although note has been proof read prior to signing, occasional typographical errors still can be missed. If any questions arise, please do not hesitate to call for verification.  Electronically signed by: Robertson Bellini, DO [facility]  "

## 2024-10-20 NOTE — Patient Instructions (Signed)

## 2024-10-21 ENCOUNTER — Telehealth: Payer: Self-pay | Admitting: Pharmacy Technician

## 2024-10-21 MED ORDER — NITRO-BID 2 % TD OINT: 0.5000 [in_us] | TOPICAL_OINTMENT | Freq: Two times a day (BID) | TRANSDERMAL | 4 refills | Status: AC | PRN

## 2024-10-21 NOTE — Telephone Encounter (Signed)
" ° °  [employer]Well ID [id number]  [phone]  healthwell number  Sent mychart "

## 2024-10-21 NOTE — Addendum Note (Signed)
 Addended by: Taeya Theall C on: 10/21/2024 12:10 PM   Modules accepted: Orders

## 2024-11-03 ENCOUNTER — Other Ambulatory Visit (HOSPITAL_COMMUNITY): Payer: Self-pay

## 2024-11-06 ENCOUNTER — Telehealth: Payer: Self-pay | Admitting: Pharmacy Technician

## 2024-11-06 NOTE — Telephone Encounter (Signed)
Repatha approval

## 2025-04-22 ENCOUNTER — Encounter
